# Patient Record
Sex: Female | Born: 1940
Health system: Southern US, Community
[De-identification: ages and names within clinical notes are randomized; demographics above are authoritative.]

## PROBLEM LIST (undated history)

## (undated) DIAGNOSIS — N943 Premenstrual tension syndrome: Secondary | ICD-10-CM

## (undated) DIAGNOSIS — G43909 Migraine, unspecified, not intractable, without status migrainosus: Secondary | ICD-10-CM

## (undated) DIAGNOSIS — L719 Rosacea, unspecified: Secondary | ICD-10-CM

## (undated) DIAGNOSIS — I10 Essential (primary) hypertension: Secondary | ICD-10-CM

## (undated) DIAGNOSIS — I639 Cerebral infarction, unspecified: Secondary | ICD-10-CM

## (undated) HISTORY — PX: OTHER SURGICAL HISTORY: SHX169

## (undated) HISTORY — DX: Premenstrual tension syndrome: N94.3

## (undated) HISTORY — DX: Migraine, unspecified, not intractable, without status migrainosus: G43.909

## (undated) HISTORY — DX: Essential (primary) hypertension: I10

## (undated) HISTORY — DX: Cerebral infarction, unspecified: I63.9

## (undated) HISTORY — DX: Rosacea, unspecified: L71.9

---

## 2003-06-07 ENCOUNTER — Encounter: Admission: RE | Admit: 2003-06-07 | Discharge: 2003-06-07 | Payer: Self-pay | Admitting: Family Medicine

## 2003-06-07 ENCOUNTER — Encounter: Payer: Self-pay | Admitting: Family Medicine

## 2003-06-21 HISTORY — PX: COLONOSCOPY: SHX174

## 2005-06-04 ENCOUNTER — Ambulatory Visit: Payer: Self-pay | Admitting: Family Medicine

## 2006-06-18 ENCOUNTER — Ambulatory Visit: Payer: Self-pay | Admitting: Family Medicine

## 2006-06-29 ENCOUNTER — Ambulatory Visit: Payer: Self-pay | Admitting: Internal Medicine

## 2006-06-30 ENCOUNTER — Ambulatory Visit: Payer: Self-pay

## 2006-08-04 ENCOUNTER — Encounter: Admission: RE | Admit: 2006-08-04 | Discharge: 2006-08-04 | Payer: Self-pay | Admitting: Family Medicine

## 2006-08-25 ENCOUNTER — Ambulatory Visit: Payer: Self-pay | Admitting: Family Medicine

## 2007-05-26 DIAGNOSIS — M810 Age-related osteoporosis without current pathological fracture: Secondary | ICD-10-CM

## 2007-05-26 DIAGNOSIS — M85859 Other specified disorders of bone density and structure, unspecified thigh: Secondary | ICD-10-CM | POA: Insufficient documentation

## 2007-05-26 DIAGNOSIS — J309 Allergic rhinitis, unspecified: Secondary | ICD-10-CM | POA: Insufficient documentation

## 2007-06-24 ENCOUNTER — Ambulatory Visit: Payer: Self-pay | Admitting: Family Medicine

## 2007-06-24 DIAGNOSIS — L719 Rosacea, unspecified: Secondary | ICD-10-CM | POA: Insufficient documentation

## 2007-07-21 ENCOUNTER — Encounter: Payer: Self-pay | Admitting: Family Medicine

## 2007-12-07 ENCOUNTER — Ambulatory Visit: Payer: Self-pay | Admitting: Family Medicine

## 2008-02-01 ENCOUNTER — Telehealth: Payer: Self-pay | Admitting: Family Medicine

## 2008-06-23 ENCOUNTER — Encounter: Payer: Self-pay | Admitting: Family Medicine

## 2008-06-23 ENCOUNTER — Ambulatory Visit: Payer: Self-pay | Admitting: Family Medicine

## 2008-06-23 ENCOUNTER — Other Ambulatory Visit: Admission: RE | Admit: 2008-06-23 | Discharge: 2008-06-23 | Payer: Self-pay | Admitting: Family Medicine

## 2008-06-28 LAB — CONVERTED CEMR LAB
Albumin: 4.2 g/dL (ref 3.5–5.2)
BUN: 21 mg/dL (ref 6–23)
Basophils Relative: 0.4 % (ref 0.0–3.0)
Calcium: 9.3 mg/dL (ref 8.4–10.5)
Creatinine, Ser: 0.6 mg/dL (ref 0.4–1.2)
Eosinophils Absolute: 0.1 10*3/uL (ref 0.0–0.7)
Eosinophils Relative: 2.3 % (ref 0.0–5.0)
GFR calc Af Amer: 128 mL/min
GFR calc non Af Amer: 106 mL/min
HCT: 39.4 % (ref 36.0–46.0)
HDL: 96.7 mg/dL (ref >39.0)
Hemoglobin: 13.9 g/dL (ref 12.0–15.0)
MCV: 86.5 fL (ref 78.0–100.0)
Monocytes Absolute: 0.3 10*3/uL (ref 0.1–1.0)
Neutro Abs: 2 10*3/uL (ref 1.4–7.7)
Platelets: 165 10*3/uL (ref 150–400)
Potassium: 4.4 meq/L (ref 3.5–5.1)
TSH: 1.79 microintl units/mL (ref 0.35–5.50)
Triglycerides: 45 mg/dL (ref 0–149)
WBC: 4.3 10*3/uL — ABNORMAL LOW (ref 4.5–10.5)

## 2008-07-21 ENCOUNTER — Ambulatory Visit: Payer: Self-pay | Admitting: Family Medicine

## 2008-07-21 DIAGNOSIS — I10 Essential (primary) hypertension: Secondary | ICD-10-CM | POA: Insufficient documentation

## 2008-07-27 ENCOUNTER — Encounter: Payer: Self-pay | Admitting: Family Medicine

## 2008-07-27 ENCOUNTER — Ambulatory Visit: Payer: Self-pay | Admitting: Internal Medicine

## 2008-08-08 ENCOUNTER — Encounter: Admission: RE | Admit: 2008-08-08 | Discharge: 2008-08-08 | Payer: Self-pay | Admitting: Family Medicine

## 2008-08-18 ENCOUNTER — Ambulatory Visit: Payer: Self-pay | Admitting: Family Medicine

## 2009-06-26 ENCOUNTER — Other Ambulatory Visit: Admission: RE | Admit: 2009-06-26 | Discharge: 2009-06-26 | Payer: Self-pay | Admitting: Family Medicine

## 2009-06-26 ENCOUNTER — Encounter: Payer: Self-pay | Admitting: Family Medicine

## 2009-06-26 ENCOUNTER — Ambulatory Visit: Payer: Self-pay | Admitting: Family Medicine

## 2009-06-28 ENCOUNTER — Telehealth: Payer: Self-pay | Admitting: Family Medicine

## 2009-07-03 ENCOUNTER — Telehealth: Payer: Self-pay | Admitting: Family Medicine

## 2009-07-31 ENCOUNTER — Ambulatory Visit: Payer: Self-pay | Admitting: Family Medicine

## 2009-08-09 ENCOUNTER — Encounter: Admission: RE | Admit: 2009-08-09 | Discharge: 2009-08-09 | Payer: Self-pay | Admitting: Family Medicine

## 2010-06-27 ENCOUNTER — Ambulatory Visit: Payer: Self-pay | Admitting: Family Medicine

## 2010-06-27 ENCOUNTER — Other Ambulatory Visit: Admission: RE | Admit: 2010-06-27 | Discharge: 2010-06-27 | Payer: Self-pay | Admitting: Family Medicine

## 2010-06-27 LAB — HM PAP SMEAR

## 2010-07-04 ENCOUNTER — Ambulatory Visit: Payer: Self-pay | Admitting: Family Medicine

## 2010-07-08 DIAGNOSIS — L739 Follicular disorder, unspecified: Secondary | ICD-10-CM | POA: Insufficient documentation

## 2010-08-14 ENCOUNTER — Encounter: Admission: RE | Admit: 2010-08-14 | Discharge: 2010-08-14 | Payer: Self-pay | Admitting: Family Medicine

## 2010-08-14 LAB — HM MAMMOGRAPHY

## 2010-11-17 LAB — CONVERTED CEMR LAB
ALT: 18 units/L (ref 0–35)
AST: 25 units/L (ref 0–37)
Albumin: 4.1 g/dL (ref 3.5–5.2)
Alkaline Phosphatase: 63 units/L (ref 39–117)
BUN: 21 mg/dL (ref 6–23)
Basophils Absolute: 0 10*3/uL (ref 0.0–0.1)
Basophils Relative: 0.2 % (ref 0.0–3.0)
Bilirubin Urine: NEGATIVE
Bilirubin Urine: NEGATIVE
Bilirubin, Direct: 0.2 mg/dL (ref 0.0–0.3)
Blood in Urine, dipstick: NEGATIVE
CO2: 28 meq/L (ref 19–32)
CO2: 30 meq/L (ref 19–32)
Calcium: 9.3 mg/dL (ref 8.4–10.5)
Chloride: 105 meq/L (ref 96–112)
Chloride: 106 meq/L (ref 96–112)
Cholesterol: 207 mg/dL — ABNORMAL HIGH (ref 0–200)
Cholesterol: 211 mg/dL — ABNORMAL HIGH (ref 0–200)
Creatinine, Ser: 0.6 mg/dL (ref 0.4–1.2)
Creatinine, Ser: 0.6 mg/dL (ref 0.4–1.2)
Direct LDL: 100.1 mg/dL
Direct LDL: 99.6 mg/dL
Eosinophils Absolute: 0.1 10*3/uL (ref 0.0–0.6)
Eosinophils Absolute: 0.1 10*3/uL (ref 0.0–0.7)
Eosinophils Absolute: 0.1 10*3/uL (ref 0.0–0.7)
Eosinophils Relative: 2.6 % (ref 0.0–5.0)
Eosinophils Relative: 2.7 % (ref 0.0–5.0)
GFR calc Af Amer: 128 mL/min
GFR calc non Af Amer: 106 mL/min
Glucose, Bld: 84 mg/dL (ref 70–99)
Glucose, Bld: 91 mg/dL (ref 70–99)
Glucose, Bld: 94 mg/dL (ref 70–99)
Glucose, Urine, Semiquant: NEGATIVE
Glucose, Urine, Semiquant: NEGATIVE
Glucose, Urine, Semiquant: NEGATIVE
HCT: 39.3 % (ref 36.0–46.0)
HCT: 41.2 % (ref 36.0–46.0)
Hemoglobin: 13.8 g/dL (ref 12.0–15.0)
Ketones, urine, test strip: NEGATIVE
Lymphocytes Relative: 41.8 % (ref 12.0–46.0)
Lymphocytes Relative: 43.9 % (ref 12.0–46.0)
Lymphs Abs: 1.7 10*3/uL (ref 0.7–4.0)
MCHC: 33.5 g/dL (ref 30.0–36.0)
MCHC: 33.9 g/dL (ref 30.0–36.0)
MCV: 85.4 fL (ref 78.0–100.0)
MCV: 87.1 fL (ref 78.0–100.0)
MCV: 87.2 fL (ref 78.0–100.0)
Monocytes Absolute: 0.3 10*3/uL (ref 0.1–1.0)
Monocytes Absolute: 0.3 10*3/uL (ref 0.1–1.0)
Neutro Abs: 1.8 10*3/uL (ref 1.4–7.7)
Neutro Abs: 2.1 10*3/uL (ref 1.4–7.7)
Neutrophils Relative %: 42.7 % — ABNORMAL LOW (ref 43.0–77.0)
Neutrophils Relative %: 49.3 % (ref 43.0–77.0)
Neutrophils Relative %: 51.4 % (ref 43.0–77.0)
Pap Smear: NEGATIVE
Platelets: 172 10*3/uL (ref 150.0–400.0)
Potassium: 4.5 meq/L (ref 3.5–5.1)
Protein, U semiquant: NEGATIVE
RDW: 13.2 % (ref 11.5–14.6)
Sodium: 141 meq/L (ref 135–145)
Specific Gravity, Urine: 1.02
Specific Gravity, Urine: 1.02
Specific Gravity, Urine: 1.02
TSH: 1.81 microintl units/mL (ref 0.35–5.50)
TSH: 2.2 microintl units/mL (ref 0.35–5.50)
Total Bilirubin: 0.9 mg/dL (ref 0.3–1.2)
Total Protein: 7.3 g/dL (ref 6.0–8.3)
Total Protein: 7.3 g/dL (ref 6.0–8.3)
Triglycerides: 61 mg/dL (ref 0.0–149.0)
VLDL: 10 mg/dL (ref 0.0–40.0)
WBC Urine, dipstick: NEGATIVE
WBC Urine, dipstick: NEGATIVE
WBC: 4.3 10*3/uL — ABNORMAL LOW (ref 4.5–10.5)
WBC: 4.4 10*3/uL — ABNORMAL LOW (ref 4.5–10.5)
pH: 7
pH: 8.5

## 2010-11-19 NOTE — Letter (Signed)
Summary: Pharmacologist at [facility]  [address] [place], [place] [address]   Phone: [phone]  Fax: [phone]    I hereby consent to, and authorize:   to perform the following procedure:   on patient: Tricia Newman   utilizing the following anesthetic or anesthesia: local  administered by the following individual:   My physician has explained the following to me in language that I understand: The nature of the treatment/procedure, the risks of the procedure, the possible complications of the procedure, the expected benefits or effects of the treatment/procedure, and any alternatives to the procedure and their risks and benefits.  I understand that no guarantees have been made to me concerning the results of treatment, surgery, or other procedures.  If unforeseen conditions require additional procedures, and it is not reasonably practical to obtain my consent, I authorize my physician to proceed as he/she considers advisable and in my best interest, unless otherwise specified as follows.  Exceptions, if any:   I have read the previous information, and I understand it.  Any questions which may have occurred to me have been answered to my satisfaction.  ______________________________________________ Patient Signature/Date/Time  ______________________________________________ Parent, Guardian, or Legal Representative/Date (State your relationship to the patient)  ______________________________________________ Witness Signature/Date/Time

## 2010-11-19 NOTE — Assessment & Plan Note (Signed)
Summary: EMP/PT FASTING/CJR   Vital Signs:  Patient profile:   70 year old female Menstrual status:  postmenopausal Height:      61.75 inches Weight:      148 pounds BMI:     27.39 Temp:     97.9 degrees F oral BP sitting:   140 / 82  (left arm) Cuff size:   regular  Vitals Entered By: Kathrynn Speed CMA (June 27, 2010 9:15 AM) CC: emp, fasting, src Is Patient Diabetic? No Flu Vaccine Consent Questions     Do you have a history of severe allergic reactions to this vaccine? no    Any prior history of allergic reactions to egg and/or gelatin? no    Do you have a sensitivity to the preservative Thimersol? no    Do you have a past history of Guillan-Barre Syndrome? no    Do you currently have an acute febrile illness? no    Have you ever had a severe reaction to latex? no    Vaccine information given and explained to patient? yes    Are you currently pregnant? no    Lot Number:[id number]   Exp Date:04/19/2011   Site Given  Left Deltoid IM   CC:  emp, fasting, and src.  History of Present Illness: Tricia Newman is a 70 year old, single female, nonsmoker, who comes in today for a general physical examination because of a history of underlying mild hypertension and acne rosacea.  Her hypertension is treated with Zestril 10 mg daily BP 140/82, here at home 120/80.  She also takes doxycycline 100 mg b.i.d. for acne rosacea.  She takes calcium, aspirin, vitamin D, and Claritin, 10 mg daily for allergic rhinitis.  She walks daily.  She gets routine eye care, dental care, does BSE monthly, annual mammography, tetanus, 2007, seasonal flu shot 2010 and today, Pneumovax 2006, colonoscopy due to thousand 14, bone density done 2000 and shows some ostiopenia  Here for Medicare AWV:  1.   Risk factors based on Past M, S, F history:...reviewed.  No changes 2.   Physical Activities: walks daily 3.   Depression/mood: good mood.  No depression 4.   Hearing: normal 5.   ADL's: normal 6.   Fall  Risk: reviewed none 7.   Home Safety: no guns in the house 8.   Height, weight, &visual acuity:height weight, normal.  Eye exam normal by ophthalmologist 9.   Counseling: continue current exercise program and medications 10.   Labs ordered based on risk factors: done today 11.           Referral Coordination..........none indicated 13.            Cognitive Assessment .......normal mentation, able to manage her own finances  Preventive Screening-Counseling & Management  Alcohol-Tobacco     Smoking Status: quit  Current Medications (verified): 1)  Doxycycline Hyclate 100 Mg Caps (Doxycycline Hyclate) .... Take 1 Tablet By Mouth Two Times A Day 2)  Ca 1500 Vit D800 .... Once Daily 3)  Claritin 10 Mg  Tabs (Loratadine) .Marland Kitchen.. 1qd 4)  Centrum Silver  Tabs (Multiple Vitamins-Minerals) .... Once Daily 5)  Fish Oil 300 Mg Caps (Omega-3 Fatty Acids) .... Once Daily 6)  Flexeril 10 Mg Tabs (Cyclobenzaprine Hcl) .... Take 1 Tablet By Mouth Three Times A Day 7)  Vicodin Es 7.5-750 Mg Tabs (Hydrocodone-Acetaminophen) .... Take 1 Tablet By Mouth Three Times A Day 8)  Zestril 10 Mg Tabs (Lisinopril) .... Take 1 Tablet By Mouth Every Morning 9)  Aspir-Low 81 Mg Tbec (Aspirin) .... Take One Tab Once Daily  Allergies (verified): No Known Drug Allergies  Past History:  Past medical, surgical, family and social histories (including risk factors) reviewed, and no changes noted (except as noted below).  Past Medical History: Reviewed history from 07/21/2008 and no changes required. Allergic rhinitis Osteoporosis Migraine Headache PMS Rosacea Hypertension  Past Surgical History: Reviewed history from 05/26/2007 and no changes required. Childbirth x 2 Colonoscopy-06/2003  Family History: Reviewed history and no changes required.  Social History: Reviewed history from 06/24/2007 and no changes required. Former Smoker Alcohol use-no Regular exercise-yes  Review of Systems      See  HPI  Physical Exam  General:  Well-developed,well-nourished,in no acute distress; alert,appropriate and cooperative throughout examination Head:  Normocephalic and atraumatic without obvious abnormalities. No apparent alopecia or balding. Eyes:  No corneal or conjunctival inflammation noted. EOMI. Perrla. Funduscopic exam benign, without hemorrhages, exudates or papilledema. Vision grossly normal. Ears:  External ear exam shows no significant lesions or deformities.  Otoscopic examination reveals clear canals, tympanic membranes are intact bilaterally without bulging, retraction, inflammation or discharge. Hearing is grossly normal bilaterally. Nose:  External nasal examination shows no deformity or inflammation. Nasal mucosa are pink and moist without lesions or exudates. Mouth:  Oral mucosa and oropharynx without lesions or exudates.  Teeth in good repair. Neck:  No deformities, masses, or tenderness noted. Chest Wall:  No deformities, masses, or tenderness noted. Breasts:  No mass, nodules, thickening, tenderness, bulging, retraction, inflamation, nipple discharge or skin changes noted.   Lungs:  Normal respiratory effort, chest expands symmetrically. Lungs are clear to auscultation, no crackles or wheezes. Heart:  Normal rate and regular rhythm. S1 and S2 normal without gallop, murmur, click, rub or other extra sounds. Abdomen:  Bowel sounds positive,abdomen soft and non-tender without masses, organomegaly or hernias noted. Rectal:  No external abnormalities noted. Normal sphincter tone. No rectal masses or tenderness. Genitalia:  Pelvic Exam:        External: normal female genitalia without lesions or masses        Vagina: normal without lesions or masses        Cervix: normal without lesions or masses        Adnexa: normal bimanual exam without masses or fullness        Uterus: normal by palpation        Pap smear: performed Msk:  No deformity or scoliosis noted of thoracic or lumbar  spine.   Pulses:  R and L carotid,radial,femoral,dorsalis pedis and posterior tibial pulses are full and equal bilaterally Extremities:  No clubbing, cyanosis, edema, or deformity noted with normal full range of motion of all joints.   Neurologic:  No cranial nerve deficits noted. Station and gait are normal. Plantar reflexes are down-going bilaterally. DTRs are symmetrical throughout. Sensory, motor and coordinative functions appear intact. Skin:  Intact without suspicious lesions or rashes......................Marland Kitchenexcept for a pitted lesion, mid forehead.  Advised to return next week for removal Cervical Nodes:  No lymphadenopathy noted Axillary Nodes:  No palpable lymphadenopathy Inguinal Nodes:  No significant adenopathy Psych:  Cognition and judgment appear intact. Alert and cooperative with normal attention span and concentration. No apparent delusions, illusions, hallucinations   Impression & Recommendations:  Problem # 1:  HYPERTENSION (ICD-401.9) Assessment Improved  Her updated medication list for this problem includes:    Zestril 10 Mg Tabs (Lisinopril) .Marland Kitchen... Take 1 tablet by mouth every morning  Orders: Venipuncture (21308) TLB-Lipid Panel (80061-LIPID) TLB-BMP (  Basic Metabolic Panel-BMET) (80048-METABOL) TLB-CBC Platelet - w/Differential (85025-CBCD) TLB-Hepatic/Liver Function Pnl (80076-HEPATIC) TLB-TSH (Thyroid Stimulating Hormone) (13086-VHQ) Prescription Created Electronically [id number]) Medicare -1st Annual Wellness Visit [phone]) Urinalysis-dipstick only (Medicare patient) (13244WN) EKG w/ Interpretation (93000) Specimen Handling (02725)  Problem # 2:  ACNE ROSACEA (ICD-695.3) Assessment: Improved  Orders: Venipuncture (36644) TLB-Lipid Panel (80061-LIPID) TLB-BMP (Basic Metabolic Panel-BMET) (80048-METABOL) TLB-CBC Platelet - w/Differential (85025-CBCD) TLB-Hepatic/Liver Function Pnl (80076-HEPATIC) TLB-TSH (Thyroid Stimulating Hormone) (03474-QVZ) Prescription  Created Electronically [phone]) Medicare -1st Annual Wellness Visit [phone]) Urinalysis-dipstick only (Medicare patient) (29518AC) Specimen Handling (16606)  Problem # 3:  HEALTH SCREENING (ICD-V70.0) Assessment: Unchanged  Orders: Venipuncture (30160) TLB-Lipid Panel (80061-LIPID) TLB-BMP (Basic Metabolic Panel-BMET) (80048-METABOL) TLB-CBC Platelet - w/Differential (85025-CBCD) TLB-Hepatic/Liver Function Pnl (80076-HEPATIC) TLB-TSH (Thyroid Stimulating Hormone) (10932-TFT) Prescription Created Electronically [phone]) Medicare -1st Annual Wellness Visit [phone]) Urinalysis-dipstick only (Medicare patient) (06237SE) EKG w/ Interpretation (93000) Specimen Handling (83151)  Problem # 4:  ALLERGIC RHINITIS (ICD-477.9) Assessment: Unchanged  Her updated medication list for this problem includes:    Claritin 10 Mg Tabs (Loratadine) .Marland Kitchen... 1qd  Orders: Venipuncture (76160) TLB-Lipid Panel (80061-LIPID) TLB-BMP (Basic Metabolic Panel-BMET) (80048-METABOL) TLB-CBC Platelet - w/Differential (85025-CBCD) TLB-Hepatic/Liver Function Pnl (80076-HEPATIC) TLB-TSH (Thyroid Stimulating Hormone) (73710-GYI) Prescription Created Electronically [phone]) Medicare -1st Annual Wellness Visit [phone]) Urinalysis-dipstick only (Medicare patient) (50093GH) Specimen Handling (82993)  Complete Medication List: 1)  Doxycycline Hyclate 100 Mg Caps (Doxycycline hyclate) .... Take 1 tablet by mouth two times a day 2)  Ca 1500 Vit D800  .... Once daily 3)  Claritin 10 Mg Tabs (Loratadine) .Marland Kitchen.. 1qd 4)  Centrum Silver Tabs (Multiple vitamins-minerals) .... Once daily 5)  Fish Oil 300 Mg Caps (Omega-3 fatty acids) .... Once daily 6)  Flexeril 10 Mg Tabs (Cyclobenzaprine hcl) .... Take 1 tablet by mouth three times a day 7)  Vicodin Es 7.5-750 Mg Tabs (Hydrocodone-acetaminophen) .... Take 1 tablet by mouth three times a day 8)  Zestril 10 Mg Tabs (Lisinopril) .... Take 1 tablet by mouth every morning 9)   Aspir-low 81 Mg Tbec (Aspirin) .... Take one tab once daily  Other Orders: Admin 1st Vaccine (71696) Flu Vaccine 63yrs + (78938)  Patient Instructions: 1)  Please schedule a follow-up appointment in 1 year. 2)  It is important that you exercise regularly at least 20 minutes 5 times a week. If you develop chest pain, have severe difficulty breathing, or feel very tired , stop exercising immediately and seek medical attention. 3)  Schedule your mammogram. 4)  Schedule a colonoscopy/sigmoidoscopy to help detect colon cancer. 5)  Take calcium +Vitamin D daily. 6)  Take an Aspirin every day. 7)  Choose your Health care Power of Attorney and/or prepare a Living Will. 8)  set up an appointment next week to get the mole removed from your forehead Prescriptions: ZESTRIL 10 MG TABS (LISINOPRIL) Take 1 tablet by mouth every morning  #100.0 Each x 3   Entered and Authorized by:   Roderick Pee MD   Signed by:   Roderick Pee MD on 06/27/2010   Method used:   Electronically to        [facility]* (retail)       [address] [place], [place]  [address]       Ph: [phone]       Fax: [phone]   RxID:   [id number] DOXYCYCLINE HYCLATE 100 MG CAPS (DOXYCYCLINE HYCLATE) Take 1 tablet by mouth two times a day  #200  x 4   Entered and Authorized by:   Roderick Pee MD   Signed by:   Roderick Pee MD on 06/27/2010   Method used:   Electronically to        [facility]* (retail)       [address] [place], [place]  [address]       Ph: [phone]       Fax: [phone]   RxID:   [id number]     Laboratory Results   Urine Tests    Routine Urinalysis   Color: yellow Appearance: Clear Glucose: negative   (Normal Range: Negative) Bilirubin: negative   (Normal Range: Negative) Ketone: trace (5)   (Normal Range: Negative) Spec. Gravity: 1.020   (Normal Range: 1.003-1.035) Blood: negative   (Normal Range: Negative) pH: 8.5   (Normal Range:  5.0-8.0) Protein: negative   (Normal Range: Negative) Urobilinogen: 0.2   (Normal Range: 0-1) Nitrite: negative   (Normal Range: Negative) Leukocyte Esterace: negative   (Normal Range: Negative)

## 2010-11-19 NOTE — Assessment & Plan Note (Signed)
Summary: mole removal//alp   Procedure Note Last Tetanus: Td (06/18/2006)  Mole Biopsy/Removal: Indication: rule out cancer Consent signed: yes  Procedure # 1: elliptical incision with 2 mm margin    Size (in cm): 0.5 x 0.5    Region: anterior    Location: forehead    Instrument used: #15 blade    Anesthesia: 1% lidocaine w/epinephrine    Closure: cautery  Cleaned and prepped with: alcohol Wound dressing: bandaid   History of Present Illness: Tricia Newman is a 70 year old female, who comes in today for removal of a lesion on her for head.  We did her physical examination last week.  She is a pitted lesion on her for it.  She returns today for removal.  Allergies: No Known Drug Allergies   Complete Medication List: 1)  Doxycycline Hyclate 100 Mg Caps (Doxycycline hyclate) .... Take 1 tablet by mouth two times a day 2)  Ca 1500 Vit D800  .... Once daily 3)  Claritin 10 Mg Tabs (Loratadine) .Marland Kitchen.. 1qd 4)  Centrum Silver Tabs (Multiple vitamins-minerals) .... Once daily 5)  Fish Oil 300 Mg Caps (Omega-3 fatty acids) .... Once daily 6)  Flexeril 10 Mg Tabs (Cyclobenzaprine hcl) .... Take 1 tablet by mouth three times a day 7)  Vicodin Es 7.5-750 Mg Tabs (Hydrocodone-acetaminophen) .... Take 1 tablet by mouth three times a day 8)  Zestril 10 Mg Tabs (Lisinopril) .... Take 1 tablet by mouth every morning 9)  Aspir-low 81 Mg Tbec (Aspirin) .... Take one tab once daily  Other Orders: Shave Skin Lesion < 0.5cm face/ears/eyelids/nose/lips/mm (11310) Prescriptions: VICODIN ES 7.5-750 MG TABS (HYDROCODONE-ACETAMINOPHEN) Take 1 tablet by mouth three times a day  #30 x 2   Entered and Authorized by:   Roderick Pee MD   Signed by:   Roderick Pee MD on 07/04/2010   Method used:   Print then Give to Patient   RxID:   [id number] FLEXERIL 10 MG TABS (CYCLOBENZAPRINE HCL) Take 1 tablet by mouth three times a day  #30 x 2   Entered and Authorized by:   Roderick Pee MD   Signed by:    Roderick Pee MD on 07/04/2010   Method used:   Print then Give to Patient   RxID:   [id number]

## 2010-11-19 NOTE — Miscellaneous (Signed)
Summary: Consent for Mole Removal  Consent for Mole Removal   Imported By: Maryln Gottron 07/09/2010 10:10:27  _____________________________________________________________________  External Attachment:    Type:   Image     Comment:   External Document

## 2011-03-16 ENCOUNTER — Emergency Department (INDEPENDENT_AMBULATORY_CARE_PROVIDER_SITE_OTHER): Payer: Medicare Other

## 2011-03-16 ENCOUNTER — Emergency Department (HOSPITAL_BASED_OUTPATIENT_CLINIC_OR_DEPARTMENT_OTHER)
Admission: EM | Admit: 2011-03-16 | Discharge: 2011-03-16 | Disposition: A | Discharge status: Home / Self Care | Payer: Medicare Other | Attending: Emergency Medicine | Admitting: Emergency Medicine

## 2011-03-16 DIAGNOSIS — N200 Calculus of kidney: Secondary | ICD-10-CM | POA: Insufficient documentation

## 2011-03-16 DIAGNOSIS — D259 Leiomyoma of uterus, unspecified: Secondary | ICD-10-CM | POA: Insufficient documentation

## 2011-03-16 DIAGNOSIS — I10 Essential (primary) hypertension: Secondary | ICD-10-CM | POA: Insufficient documentation

## 2011-03-16 DIAGNOSIS — R1031 Right lower quadrant pain: Secondary | ICD-10-CM | POA: Insufficient documentation

## 2011-03-16 LAB — BASIC METABOLIC PANEL
CO2: 23 mEq/L (ref 19–32)
Calcium: 9.8 mg/dL (ref 8.4–10.5)
Creatinine, Ser: <0.47 mg/dL (ref 0.4–1.2)

## 2011-03-16 LAB — CBC
MCH: 28.6 pg (ref 26.0–34.0)
MCHC: 35 g/dL (ref 30.0–36.0)
Platelets: 201 10*3/uL (ref 150–400)
RDW: 13.7 % (ref 11.5–15.5)

## 2011-03-16 LAB — DIFFERENTIAL
Basophils Relative: 0 % (ref 0–1)
Eosinophils Absolute: 0.1 10*3/uL (ref 0.0–0.7)
Monocytes Absolute: 0.4 10*3/uL (ref 0.1–1.0)
Monocytes Relative: 8 % (ref 3–12)

## 2011-03-16 LAB — URINALYSIS, ROUTINE W REFLEX MICROSCOPIC
Bilirubin Urine: NEGATIVE
Hgb urine dipstick: NEGATIVE
Nitrite: NEGATIVE
Protein, ur: NEGATIVE mg/dL
Urobilinogen, UA: 0.2 mg/dL (ref 0.0–1.0)

## 2011-03-16 MED ORDER — IOHEXOL 300 MG/ML  SOLN
100.0000 mL | Freq: Once | INTRAMUSCULAR | Status: AC | PRN
  Administered 2011-03-16: 100 mL via INTRAVENOUS

## 2011-07-14 ENCOUNTER — Ambulatory Visit: Payer: Self-pay | Admitting: Family Medicine

## 2011-07-21 ENCOUNTER — Other Ambulatory Visit: Payer: Self-pay | Admitting: Family Medicine

## 2011-07-21 DIAGNOSIS — Z1231 Encounter for screening mammogram for malignant neoplasm of breast: Secondary | ICD-10-CM

## 2011-07-30 ENCOUNTER — Ambulatory Visit (INDEPENDENT_AMBULATORY_CARE_PROVIDER_SITE_OTHER): Payer: Medicare Other | Admitting: Family Medicine

## 2011-07-30 ENCOUNTER — Encounter: Payer: Self-pay | Admitting: Family Medicine

## 2011-07-30 DIAGNOSIS — I10 Essential (primary) hypertension: Secondary | ICD-10-CM

## 2011-07-30 DIAGNOSIS — Z0000 Encounter for general adult medical examination without abnormal findings: Secondary | ICD-10-CM

## 2011-07-30 DIAGNOSIS — Z23 Encounter for immunization: Secondary | ICD-10-CM

## 2011-07-30 DIAGNOSIS — L719 Rosacea, unspecified: Secondary | ICD-10-CM

## 2011-07-30 LAB — LIPID PANEL
Cholesterol: 214 mg/dL — ABNORMAL HIGH (ref 0–200)
Total CHOL/HDL Ratio: 2
Triglycerides: 80 mg/dL (ref 0.0–149.0)
VLDL: 16 mg/dL (ref 0.0–40.0)

## 2011-07-30 LAB — CBC WITH DIFFERENTIAL/PLATELET
Basophils Absolute: 0 10*3/uL (ref 0.0–0.1)
Eosinophils Absolute: 0.1 10*3/uL (ref 0.0–0.7)
Lymphocytes Relative: 37.5 % (ref 12.0–46.0)
MCHC: 33.3 g/dL (ref 30.0–36.0)
Neutrophils Relative %: 53.3 % (ref 43.0–77.0)
RDW: 14.7 % — ABNORMAL HIGH (ref 11.5–14.6)

## 2011-07-30 LAB — POCT URINALYSIS DIPSTICK
Bilirubin, UA: NEGATIVE
Blood, UA: NEGATIVE
Glucose, UA: NEGATIVE
Ketones, UA: NEGATIVE
Nitrite, UA: NEGATIVE
Spec Grav, UA: 1.02

## 2011-07-30 LAB — BASIC METABOLIC PANEL
CO2: 28 mEq/L (ref 19–32)
Calcium: 9.2 mg/dL (ref 8.4–10.5)
Creatinine, Ser: 0.6 mg/dL (ref 0.4–1.2)

## 2011-07-30 LAB — HEPATIC FUNCTION PANEL
Alkaline Phosphatase: 77 U/L (ref 39–117)
Bilirubin, Direct: 0 mg/dL (ref 0.0–0.3)
Total Bilirubin: 0.7 mg/dL (ref 0.3–1.2)

## 2011-07-30 MED ORDER — LISINOPRIL 10 MG PO TABS: 10.0000 mg | ORAL_TABLET | Freq: Every day | ORAL | Status: DC

## 2011-07-30 MED ORDER — DOXYCYCLINE HYCLATE 100 MG PO CAPS: 100.0000 mg | ORAL_CAPSULE | Freq: Two times a day (BID) | ORAL | Status: DC

## 2011-07-30 NOTE — Progress Notes (Signed)
  Subjective:    Patient ID: Tricia Newman, female    DOB: 15-May-1941, 70 y.o.   MRN: [id number]  HPI Tricia Newman is a 70 year old, widowed female, nonsmoker, who comes in today for Medicare wellness examination because of a history of rosacea, allergic rhinitis, hypertension.  She takes lisinopril, 10 mg daily for hypertension, BP 120/78.  She takes Claritin OTC for allergic rhinitis.  She takes doxycycline 100 mg b.i.d. For rosacea.  She gets routine eye care, hearing normal, regular dental care, BSE monthly, annual mammography, colonoscopy, 2005, normal, tetanus, 2007, Pneumovax 2006, shingles 2009.  Activities daily living.  Normal.  Cognitive function, normal.  She functions independently.  Home health safety reviewed.  No issues identified.  No guns in the house.  She does have a healthcare power of attorney and living will.  She walks on a daily basis and tends a big garden in the summer.  Sheb rings a wonderful vegetables   Review of Systems  Constitutional: Negative.   HENT: Negative.   Eyes: Negative.   Respiratory: Negative.   Cardiovascular: Negative.   Gastrointestinal: Negative.   Genitourinary: Negative.   Musculoskeletal: Negative.   Neurological: Negative.   Hematological: Negative.   Psychiatric/Behavioral: Negative.   b     Objective:   Physical Exam  Constitutional: She appears well-developed and well-nourished.  HENT:  Head: Normocephalic and atraumatic.  Right Ear: External ear normal.  Left Ear: External ear normal.  Nose: Nose normal.  Mouth/Throat: Oropharynx is clear and moist.  Eyes: EOM are normal. Pupils are equal, round, and reactive to light.  Neck: Normal range of motion. Neck supple. No thyromegaly present.  Cardiovascular: Normal rate, regular rhythm, normal heart sounds and intact distal pulses.  Exam reveals no gallop and no friction rub.   No murmur heard. Pulmonary/Chest: Effort normal and breath sounds normal.  Abdominal: Soft. Bowel  sounds are normal. She exhibits no distension and no mass. There is no tenderness. There is no rebound.  Genitourinary: Vagina normal and uterus normal. Guaiac negative stool. No vaginal discharge found.  Musculoskeletal: Normal range of motion.  Lymphadenopathy:    She has no cervical adenopathy.  Neurological: She is alert. She has normal reflexes. No cranial nerve deficit. She exhibits normal muscle tone. Coordination normal.  Skin: Skin is warm and dry.  Psychiatric: She has a normal mood and affect. Her behavior is normal. Judgment and thought content normal.   Bilateral breast exam normal       Assessment & Plan:  Healthy female.  Hypertension.  Continues, lisinopril 10 mg daily.  Rosacea continue doxycycline 100 mg b.i.d.  Allergic rhinitis.  Continue OTC Claritin.  Return one year or sooner if any problems

## 2011-07-30 NOTE — Patient Instructions (Signed)
Continue your current medications.  Follow-up in one year or sooner if any problems

## 2011-08-18 ENCOUNTER — Ambulatory Visit
Admission: RE | Admit: 2011-08-18 | Discharge: 2011-08-18 | Disposition: A | Discharge status: Home / Self Care | Payer: Medicare Other | Source: Ambulatory Visit | Attending: Family Medicine | Admitting: Family Medicine

## 2011-08-18 DIAGNOSIS — Z1231 Encounter for screening mammogram for malignant neoplasm of breast: Secondary | ICD-10-CM

## 2011-09-19 ENCOUNTER — Other Ambulatory Visit: Payer: Self-pay | Admitting: Family Medicine

## 2011-10-31 DIAGNOSIS — H40019 Open angle with borderline findings, low risk, unspecified eye: Secondary | ICD-10-CM | POA: Diagnosis not present

## 2012-06-22 ENCOUNTER — Other Ambulatory Visit: Payer: Self-pay | Admitting: Family Medicine

## 2012-06-22 ENCOUNTER — Telehealth: Payer: Self-pay | Admitting: Family Medicine

## 2012-06-22 DIAGNOSIS — Z1231 Encounter for screening mammogram for malignant neoplasm of breast: Secondary | ICD-10-CM

## 2012-06-22 NOTE — Telephone Encounter (Signed)
[facility] Triage Call Report Triage Record Num: [id number] Operator: Chevis Pretty Patient Name: [name] Call Date & Time: 06/21/2012 11:18:07AM Patient Phone: [phone] PCP: Eugenio Hoes. Todd Patient Gender: Female PCP Fax : [phone] Patient DOB: [birth year] Practice Name: [facility] Reason for Call: Caller: [place]/Patient; PCP: Kelle Darting [facility]); CB#: [phone]; Call regarding Back Pain; having muscle spasms, and has run out of medication. States her hydrocodone medication expiration date was 02/13. States onset new shoulder pain after working in the garden 06/20/12. States pain makes right shoulder have spasms which are excruciating, about 30-60sec apart. Advil is ineffective. Per shoulder pain protocol, emergent symptoms denied; advised appointment within 24 hours. To call office in AM for appointment, as Dr. Tawanna Cooler is not in office 06/22/12. Per standing orders, Rx for flexeril 5mg , 1 po TID, #6, NR and ultram 50mg , 1-2 q 4-6h prn pain, #6, NR called in to [facility]/ [phone]. Protocol(s) Used: Shoulder Non-Injury Recommended Outcome per Protocol: See Provider within 24 hours Reason for Outcome: Severe pain with movement that limits normal activities

## 2012-06-22 NOTE — Telephone Encounter (Signed)
Pt call back this morning medications working great

## 2012-08-02 ENCOUNTER — Encounter: Payer: Self-pay | Admitting: Family Medicine

## 2012-08-02 ENCOUNTER — Ambulatory Visit (INDEPENDENT_AMBULATORY_CARE_PROVIDER_SITE_OTHER): Payer: Medicare Other | Admitting: Family Medicine

## 2012-08-02 VITALS — BP 130/80 | Temp 97.7°F | Ht 63.0 in | Wt 150.0 lb

## 2012-08-02 DIAGNOSIS — M549 Dorsalgia, unspecified: Secondary | ICD-10-CM

## 2012-08-02 DIAGNOSIS — G8929 Other chronic pain: Secondary | ICD-10-CM

## 2012-08-02 DIAGNOSIS — J309 Allergic rhinitis, unspecified: Secondary | ICD-10-CM | POA: Diagnosis not present

## 2012-08-02 DIAGNOSIS — M810 Age-related osteoporosis without current pathological fracture: Secondary | ICD-10-CM | POA: Diagnosis not present

## 2012-08-02 DIAGNOSIS — Z0000 Encounter for general adult medical examination without abnormal findings: Secondary | ICD-10-CM | POA: Diagnosis not present

## 2012-08-02 DIAGNOSIS — Z23 Encounter for immunization: Secondary | ICD-10-CM | POA: Diagnosis not present

## 2012-08-02 DIAGNOSIS — I10 Essential (primary) hypertension: Secondary | ICD-10-CM

## 2012-08-02 LAB — CBC WITH DIFFERENTIAL/PLATELET
Basophils Absolute: 0 10*3/uL (ref 0.0–0.1)
Basophils Relative: 0.6 % (ref 0.0–3.0)
Eosinophils Absolute: 0.1 10*3/uL (ref 0.0–0.7)
Hemoglobin: 13.7 g/dL (ref 12.0–15.0)
MCHC: 32.4 g/dL (ref 30.0–36.0)
MCV: 88 fl (ref 78.0–100.0)
Monocytes Absolute: 0.3 10*3/uL (ref 0.1–1.0)
Neutro Abs: 2.3 10*3/uL (ref 1.4–7.7)
RBC: 4.8 Mil/uL (ref 3.87–5.11)
RDW: 14.3 % (ref 11.5–14.6)

## 2012-08-02 LAB — BASIC METABOLIC PANEL
BUN: 22 mg/dL (ref 6–23)
CO2: 27 mEq/L (ref 19–32)
Calcium: 9.2 mg/dL (ref 8.4–10.5)
Creatinine, Ser: 0.5 mg/dL (ref 0.4–1.2)
Glucose, Bld: 89 mg/dL (ref 70–99)

## 2012-08-02 LAB — POCT URINALYSIS DIPSTICK
Blood, UA: NEGATIVE
Nitrite, UA: NEGATIVE
Protein, UA: NEGATIVE
Spec Grav, UA: 1.015
Urobilinogen, UA: 0.2
pH, UA: 5.5

## 2012-08-02 MED ORDER — TRAMADOL HCL 50 MG PO TABS: ORAL_TABLET | ORAL | Status: DC

## 2012-08-02 MED ORDER — LISINOPRIL 10 MG PO TABS: ORAL_TABLET | ORAL | Status: DC

## 2012-08-02 MED ORDER — CYCLOBENZAPRINE HCL 10 MG PO TABS: ORAL_TABLET | ORAL | Status: DC

## 2012-08-02 NOTE — Progress Notes (Signed)
  Subjective:    Patient ID: Tricia Newman, female    DOB: 05/18/1941, 71 y.o.   MRN: [id number]  HPI Tricia Newman is a 71 year old single female nonsmoker who comes in today for a Medicare wellness examination because a history of hypertension mild osteoporosis and recurrent low back pain  Her medications reviewed there've been no changes. She takes Flexeril and tramadol when necessary when she has a flare of her back pain. Her blood pressure is 130/80 on lisinopril 10 mg daily. She takes over-the-counter Claritin for allergic rhinitis  She takes calcium vitamin D and she walks on a daily basis to maintain bone strength. She gets routine eye care, dental care, BSE monthly, and you mammography, colonoscopy and GI, tetanus 2007, Pneumovax 2006, shingles 2009, seasonal flu shot today.   Review of Systems  Constitutional: Negative.   HENT: Negative.   Eyes: Negative.   Respiratory: Negative.   Cardiovascular: Negative.   Gastrointestinal: Negative.   Genitourinary: Negative.   Musculoskeletal: Negative.   Neurological: Negative.   Hematological: Negative.   Psychiatric/Behavioral: Negative.        Objective:   Physical Exam  Constitutional: She appears well-developed and well-nourished.  HENT:  Head: Normocephalic and atraumatic.  Right Ear: External ear normal.  Left Ear: External ear normal.  Nose: Nose normal.  Mouth/Throat: Oropharynx is clear and moist.  Eyes: EOM are normal. Pupils are equal, round, and reactive to light.  Neck: Normal range of motion. Neck supple. No thyromegaly present.  Cardiovascular: Normal rate, regular rhythm, normal heart sounds and intact distal pulses.  Exam reveals no gallop and no friction rub.   No murmur heard. Pulmonary/Chest: Effort normal and breath sounds normal.  Abdominal: Soft. Bowel sounds are normal. She exhibits no distension and no mass. There is no tenderness. There is no rebound.  Genitourinary:       Pelvic exam deferred she had  one last year and was normal  Bilateral breast exam normal  Musculoskeletal: Normal range of motion.  Lymphadenopathy:    She has no cervical adenopathy.  Neurological: She is alert. She has normal reflexes. No cranial nerve deficit. She exhibits normal muscle tone. Coordination normal.  Skin: Skin is warm and dry.  Psychiatric: She has a normal mood and affect. Her behavior is normal. Judgment and thought content normal.          Assessment & Plan:  Healthy female  Recurrent low back pain plan tramadol and Flexeril when necessary  Hypertension continue lisinopril 10 mg daily  History of mild osteoporosis continue vitamin D calcium and daily exercise.

## 2012-08-18 ENCOUNTER — Ambulatory Visit
Admission: RE | Admit: 2012-08-18 | Discharge: 2012-08-18 | Disposition: A | Discharge status: Home / Self Care | Payer: Medicare Other | Source: Ambulatory Visit | Attending: Family Medicine | Admitting: Family Medicine

## 2012-08-18 DIAGNOSIS — Z1231 Encounter for screening mammogram for malignant neoplasm of breast: Secondary | ICD-10-CM

## 2012-11-23 DIAGNOSIS — H40019 Open angle with borderline findings, low risk, unspecified eye: Secondary | ICD-10-CM | POA: Diagnosis not present

## 2013-04-14 ENCOUNTER — Other Ambulatory Visit: Payer: Self-pay | Admitting: *Deleted

## 2013-04-14 MED ORDER — TIZANIDINE HCL 4 MG PO TABS: 4.0000 mg | ORAL_TABLET | Freq: Every evening | ORAL | Status: DC | PRN

## 2013-04-14 NOTE — Telephone Encounter (Signed)
Flexeril is not covered by insurance so Rx for tizanidine has been sent. Left message on machine for patient

## 2013-06-08 ENCOUNTER — Encounter: Payer: Self-pay | Admitting: *Deleted

## 2013-06-10 ENCOUNTER — Encounter: Payer: Self-pay | Admitting: Gastroenterology

## 2013-07-18 ENCOUNTER — Other Ambulatory Visit: Payer: Self-pay

## 2013-07-18 DIAGNOSIS — Z1231 Encounter for screening mammogram for malignant neoplasm of breast: Secondary | ICD-10-CM

## 2013-08-04 ENCOUNTER — Encounter: Payer: Self-pay | Admitting: Family Medicine

## 2013-08-04 ENCOUNTER — Ambulatory Visit (INDEPENDENT_AMBULATORY_CARE_PROVIDER_SITE_OTHER): Payer: Medicare Other | Admitting: Family Medicine

## 2013-08-04 VITALS — BP 130/80 | Temp 98.6°F | Ht 62.0 in | Wt 148.0 lb

## 2013-08-04 DIAGNOSIS — Z23 Encounter for immunization: Secondary | ICD-10-CM | POA: Diagnosis not present

## 2013-08-04 DIAGNOSIS — Z0000 Encounter for general adult medical examination without abnormal findings: Secondary | ICD-10-CM | POA: Diagnosis not present

## 2013-08-04 DIAGNOSIS — M810 Age-related osteoporosis without current pathological fracture: Secondary | ICD-10-CM

## 2013-08-04 DIAGNOSIS — J309 Allergic rhinitis, unspecified: Secondary | ICD-10-CM

## 2013-08-04 DIAGNOSIS — I10 Essential (primary) hypertension: Secondary | ICD-10-CM | POA: Diagnosis not present

## 2013-08-04 DIAGNOSIS — G8929 Other chronic pain: Secondary | ICD-10-CM

## 2013-08-04 DIAGNOSIS — M549 Dorsalgia, unspecified: Secondary | ICD-10-CM

## 2013-08-04 LAB — BASIC METABOLIC PANEL
CO2: 30 mEq/L (ref 19–32)
Chloride: 102 mEq/L (ref 96–112)
Creatinine, Ser: 0.7 mg/dL (ref 0.4–1.2)
Potassium: 5.3 mEq/L — ABNORMAL HIGH (ref 3.5–5.1)

## 2013-08-04 LAB — POCT URINALYSIS DIPSTICK
Bilirubin, UA: NEGATIVE
Ketones, UA: NEGATIVE
Leukocytes, UA: NEGATIVE
Protein, UA: NEGATIVE
Spec Grav, UA: 1.015

## 2013-08-04 LAB — CBC WITH DIFFERENTIAL/PLATELET
Basophils Relative: 0.3 % (ref 0.0–3.0)
Eosinophils Relative: 2.4 % (ref 0.0–5.0)
Hemoglobin: 14.5 g/dL (ref 12.0–15.0)
Lymphs Abs: 1.8 10*3/uL (ref 0.7–4.0)
MCHC: 33.6 g/dL (ref 30.0–36.0)
Monocytes Absolute: 0.3 10*3/uL (ref 0.1–1.0)
Monocytes Relative: 6.8 % (ref 3.0–12.0)

## 2013-08-04 LAB — TSH: TSH: 1.78 u[IU]/mL (ref 0.35–5.50)

## 2013-08-04 MED ORDER — TRAMADOL HCL 50 MG PO TABS: ORAL_TABLET | ORAL | Status: DC

## 2013-08-04 MED ORDER — CYCLOBENZAPRINE HCL 10 MG PO TABS: ORAL_TABLET | ORAL | Status: DC

## 2013-08-04 MED ORDER — LISINOPRIL 10 MG PO TABS: ORAL_TABLET | ORAL | Status: DC

## 2013-08-04 NOTE — Progress Notes (Signed)
  Subjective:    Patient ID: Tricia Newman, female    DOB: [birth year], 72 y.o.   MRN: [id number]  HPI Tricia Newman is a 72 year old married female nonsmoker who comes in today for a Medicare wellness examination because of a history of mild osteoporosis, hypertension, allergic rhinitis, chronic back pain  Her medications reviewed the been no changes except she was switched from Flexeril to Zanaflex. The Flexeril is her preferred medication  She gets routine eye care, dental care, BSE monthly, and you mammography, last colonoscopy 2004 she did recently get a recall card from Dr. Jarold Motto. 6  She's due for Pneumovax and a flu shot today. All other vaccinations are up-to-date  Cognitive function normal she exercises and tends to regard and on a daily basis home health safety reviewed no issues identified, no guns in the house, she does have a health care power of attorney and living well  The Flexeril and tramadol she takes one of each or half of each at bedtime when she has a lot of thoracic and cervical neck pain. Again the medicines are when necessary   Review of Systems  Constitutional: Negative.   HENT: Negative.   Eyes: Negative.   Respiratory: Negative.   Cardiovascular: Negative.   Gastrointestinal: Negative.   Endocrine: Negative.   Genitourinary: Negative.   Musculoskeletal: Negative.   Allergic/Immunologic: Negative.   Neurological: Negative.   Hematological: Negative.   Psychiatric/Behavioral: Negative.        Objective:   Physical Exam  Constitutional: She appears well-developed and well-nourished.  HENT:  Head: Normocephalic and atraumatic.  Right Ear: External ear normal.  Left Ear: External ear normal.  Nose: Nose normal.  Mouth/Throat: Oropharynx is clear and moist.  Eyes: EOM are normal. Pupils are equal, round, and reactive to light.  Neck: Normal range of motion. Neck supple. No thyromegaly present.  Cardiovascular: Normal rate, regular rhythm, normal heart  sounds and intact distal pulses.  Exam reveals no gallop and no friction rub.   No murmur heard. No carotid aortic bruits peripheral pulses 2+ and symmetrical  Pulmonary/Chest: Effort normal and breath sounds normal.  Abdominal: Soft. Bowel sounds are normal. She exhibits no distension and no mass. There is no tenderness. There is no rebound.  Genitourinary: Vagina normal and uterus normal. Guaiac negative stool. No vaginal discharge found.  Bilateral breast exam normal  Pelvic normal Pap every 3 years  Musculoskeletal: Normal range of motion.  Lymphadenopathy:    She has no cervical adenopathy.  Neurological: She is alert. She has normal reflexes. No cranial nerve deficit. She exhibits normal muscle tone. Coordination normal.  Skin: Skin is warm and dry.  Psychiatric: She has a normal mood and affect. Her behavior is normal. Judgment and thought content normal.          Assessment & Plan:  Healthy female  Hypertension at goal continue lisinopril 10 mg daily  History of osteoporosis continue calcium vitamin D and daily exercise  Allergic rhinitis rhinitis continue over-the-counter Claritin plain  Chronic neck and back pain Flexeril and tramadol each bedtime when necessary

## 2013-08-04 NOTE — Patient Instructions (Signed)
Continue your current medications  Flexeril and tramadol,,,,,,,,,, one half of each at bedtime when necessary  Return in one year for your annual Medicare wellness examination sooner if any problems  And thanks again for all the vegetables that you bring [place] from your garden in the summer

## 2013-08-19 ENCOUNTER — Ambulatory Visit
Admission: RE | Admit: 2013-08-19 | Discharge: 2013-08-19 | Disposition: A | Discharge status: Home / Self Care | Payer: Medicare Other | Source: Ambulatory Visit

## 2013-08-19 DIAGNOSIS — Z1231 Encounter for screening mammogram for malignant neoplasm of breast: Secondary | ICD-10-CM

## 2013-08-22 ENCOUNTER — Encounter: Payer: Self-pay | Admitting: Gastroenterology

## 2013-08-31 ENCOUNTER — Other Ambulatory Visit: Payer: Self-pay | Admitting: Family Medicine

## 2013-09-04 ENCOUNTER — Other Ambulatory Visit: Payer: Self-pay | Admitting: Family Medicine

## 2013-09-05 ENCOUNTER — Ambulatory Visit (AMBULATORY_SURGERY_CENTER): Payer: Self-pay

## 2013-09-05 VITALS — Ht 62.0 in | Wt 148.0 lb

## 2013-09-05 DIAGNOSIS — Z8371 Family history of colonic polyps: Secondary | ICD-10-CM

## 2013-09-05 DIAGNOSIS — Z83719 Family history of colon polyps, unspecified: Secondary | ICD-10-CM

## 2013-09-05 MED ORDER — MOVIPREP 100 G PO SOLR: 1.0000 | Freq: Once | ORAL | Status: DC

## 2013-09-06 ENCOUNTER — Other Ambulatory Visit: Payer: Self-pay | Admitting: Family Medicine

## 2013-09-26 ENCOUNTER — Encounter: Payer: Self-pay | Admitting: Gastroenterology

## 2013-09-26 ENCOUNTER — Ambulatory Visit (AMBULATORY_SURGERY_CENTER): Payer: Medicare Other | Admitting: Gastroenterology

## 2013-09-26 VITALS — BP 157/79 | HR 87 | Temp 97.9°F | Resp 23 | Ht 62.0 in | Wt 148.0 lb

## 2013-09-26 DIAGNOSIS — F411 Generalized anxiety disorder: Secondary | ICD-10-CM | POA: Diagnosis not present

## 2013-09-26 DIAGNOSIS — Z8371 Family history of colonic polyps: Secondary | ICD-10-CM | POA: Diagnosis not present

## 2013-09-26 DIAGNOSIS — Z83719 Family history of colon polyps, unspecified: Secondary | ICD-10-CM

## 2013-09-26 DIAGNOSIS — I10 Essential (primary) hypertension: Secondary | ICD-10-CM | POA: Diagnosis not present

## 2013-09-26 DIAGNOSIS — Z1211 Encounter for screening for malignant neoplasm of colon: Secondary | ICD-10-CM

## 2013-09-26 DIAGNOSIS — D126 Benign neoplasm of colon, unspecified: Secondary | ICD-10-CM | POA: Diagnosis not present

## 2013-09-26 MED ORDER — SODIUM CHLORIDE 0.9 % IV SOLN: 500.0000 mL | INTRAVENOUS | Status: DC

## 2013-09-26 NOTE — Progress Notes (Signed)
Called to room to assist during endoscopic procedure.  Patient ID and intended procedure confirmed with present staff. Received instructions for my participation in the procedure from the performing physician. ewm 

## 2013-09-26 NOTE — Progress Notes (Signed)
Report to pacu rn, vss, bbs=clear 

## 2013-09-26 NOTE — Patient Instructions (Signed)
YOU HAD AN ENDOSCOPIC PROCEDURE TODAY AT THE [facility]: Refer to the procedure report that was given to you for any specific questions about what was found during the examination.  If the procedure report does not answer your questions, please call your gastroenterologist to clarify.  If you requested that your care partner not be given the details of your procedure findings, then the procedure report has been included in a sealed envelope for you to review at your convenience later.  YOU SHOULD EXPECT: Some feelings of bloating in the abdomen. Passage of more gas than usual.  Walking can help get rid of the air that was put into your GI tract during the procedure and reduce the bloating. If you had a lower endoscopy (such as a colonoscopy or flexible sigmoidoscopy) you may notice spotting of blood in your stool or on the toilet paper. If you underwent a bowel prep for your procedure, then you may not have a normal bowel movement for a few days.  DIET: Your first meal following the procedure should be a light meal and then it is ok to progress to your normal diet.  A half-sandwich or bowl of soup is an example of a good first meal.  Heavy or fried foods are harder to digest and may make you feel nauseous or bloated.  Likewise meals heavy in dairy and vegetables can cause extra gas to form and this can also increase the bloating.  Drink plenty of fluids but you should avoid alcoholic beverages for 24 hours.  ACTIVITY: Your care partner should take you home directly after the procedure.  You should plan to take it easy, moving slowly for the rest of the day.  You can resume normal activity the day after the procedure however you should NOT DRIVE or use heavy machinery for 24 hours (because of the sedation medicines used during the test).    SYMPTOMS TO REPORT IMMEDIATELY: A gastroenterologist can be reached at any hour.  During normal business hours, 8:30 AM to 5:00 PM Monday through Friday,  call [phone].  After hours and on weekends, please call the GI answering service at [phone] who will take a message and have the physician on call contact you.   Following lower endoscopy (colonoscopy or flexible sigmoidoscopy):  Excessive amounts of blood in the stool  Significant tenderness or worsening of abdominal pains  Swelling of the abdomen that is new, acute  Fever of 100F or higher   FOLLOW UP: If any biopsies were taken you will be contacted by phone or by letter within the next 1-3 weeks.  Call your gastroenterologist if you have not heard about the biopsies in 3 weeks.  Our staff will call the home number listed on your records the next business day following your procedure to check on you and address any questions or concerns that you may have at that time regarding the information given to you following your procedure. This is a courtesy call and so if there is no answer at the home number and we have not heard from you through the emergency physician on call, we will assume that you have returned to your regular daily activities without incident.  SIGNATURES/CONFIDENTIALITY: You and/or your care partner have signed paperwork which will be entered into your electronic medical record.  These signatures attest to the fact that that the information above on your After Visit Summary has been reviewed and is understood.  Full responsibility of the confidentiality of   this discharge information lies with you and/or your care-partner.   INFORMATION ON POLYPS GIVEN TO YOU TODAY 

## 2013-09-26 NOTE — Progress Notes (Signed)
Patient did not experience any of the following events: a burn prior to discharge; a fall within the facility; wrong site/side/patient/procedure/implant event; or a hospital transfer or hospital admission upon discharge from the facility. (G8907) Patient did not have preoperative order for IV antibiotic SSI prophylaxis. (G8918)  

## 2013-09-26 NOTE — Op Note (Signed)
[facility] [address] [place], [address]   COLONOSCOPY PROCEDURE REPORT  PATIENT: Tricia Newman, Tricia Newman  MR#: [id number] BIRTHDATE: Dec 12, 1940 , 72  yrs. old GENDER: Female ENDOSCOPIST: Mardella Layman, MD, [facility] REFERRED BY: PROCEDURE DATE:  09/26/2013 PROCEDURE:   Colonoscopy with snare polypectomy First Screening Colonoscopy - Avg.  risk and is 50 yrs.  old or older - No.      History of Adenoma - Now for follow-up colonoscopy & has been > or = to 3 yrs.  N/A  Polyps Removed Today? Yes. ASA CLASS:   Class II INDICATIONS:average risk screening. MEDICATIONS: propofol (Diprivan) 300mg  IV  DESCRIPTION OF PROCEDURE:   After the risks benefits and alternatives of the procedure were thoroughly explained, informed consent was obtained.  A digital rectal exam revealed no abnormalities of the rectum.   The LB [id number]  endoscope was introduced through the anus and advanced to the cecum, which was identified by both the appendix and ileocecal valve. No adverse events experienced.   Limited by a tortuous and redundant colon. The quality of the prep was Moviprep fair  The instrument was then slowly withdrawn as the colon was fully examined.      COLON FINDINGS: A smooth flat polyp was found at the hepatic flexure.  A polypectomy was performed with a cold snare.  The resection was complete and the polyp tissue was completely retrieved.  Retroflexed views revealed no abnormalities. The time to cecum=6 minutes 14 seconds.  Withdrawal time=7 minutes 40 seconds.  The scope was withdrawn and the procedure completed. COMPLICATIONS: There were no complications.  ENDOSCOPIC IMPRESSION: Flat polyp was found at the hepatic flexure; polypectomy was performed with a cold snare...very redundant and tortuous colon./  RECOMMENDATIONS: 1.  Await pathology results 2.  Continue current medications   eSigned:  Mardella Layman, MD, [facility] 09/26/2013 8:55 AM   cc:  Roderick Pee, MD

## 2013-09-27 ENCOUNTER — Telehealth: Payer: Self-pay | Admitting: *Deleted

## 2013-09-27 NOTE — Telephone Encounter (Signed)
  Follow up Call-  Call back number 09/26/2013  Post procedure Call Back phone  # [phone]  Permission to leave phone message Yes     Patient questions:  Do you have a fever, pain , or abdominal swelling? no Pain Score  0 *  Have you tolerated food without any problems? yes  Have you been able to return to your normal activities? yes  Do you have any questions about your discharge instructions: Diet   no Medications  no Follow up visit  no  Do you have questions or concerns about your Care? no  Actions: * If pain score is 4 or above: No action needed, pain <4.

## 2013-09-30 ENCOUNTER — Encounter: Payer: Self-pay | Admitting: Gastroenterology

## 2013-11-29 DIAGNOSIS — H18419 Arcus senilis, unspecified eye: Secondary | ICD-10-CM | POA: Diagnosis not present

## 2013-11-29 DIAGNOSIS — H2510 Age-related nuclear cataract, unspecified eye: Secondary | ICD-10-CM | POA: Diagnosis not present

## 2013-11-29 DIAGNOSIS — H04129 Dry eye syndrome of unspecified lacrimal gland: Secondary | ICD-10-CM | POA: Diagnosis not present

## 2013-11-29 DIAGNOSIS — H02839 Dermatochalasis of unspecified eye, unspecified eyelid: Secondary | ICD-10-CM | POA: Diagnosis not present

## 2014-03-31 ENCOUNTER — Other Ambulatory Visit: Payer: Self-pay | Admitting: Family Medicine

## 2014-04-25 ENCOUNTER — Other Ambulatory Visit: Payer: Self-pay | Admitting: Family Medicine

## 2014-08-02 ENCOUNTER — Other Ambulatory Visit: Payer: Self-pay

## 2014-08-02 DIAGNOSIS — Z1239 Encounter for other screening for malignant neoplasm of breast: Secondary | ICD-10-CM

## 2014-08-02 DIAGNOSIS — Z1231 Encounter for screening mammogram for malignant neoplasm of breast: Secondary | ICD-10-CM

## 2014-08-09 ENCOUNTER — Encounter: Payer: Self-pay | Admitting: Family Medicine

## 2014-08-09 ENCOUNTER — Ambulatory Visit (INDEPENDENT_AMBULATORY_CARE_PROVIDER_SITE_OTHER): Payer: Medicare Other | Admitting: Family Medicine

## 2014-08-09 VITALS — BP 130/90 | Temp 98.6°F | Ht 61.75 in | Wt 150.0 lb

## 2014-08-09 DIAGNOSIS — I10 Essential (primary) hypertension: Secondary | ICD-10-CM | POA: Diagnosis not present

## 2014-08-09 DIAGNOSIS — Z23 Encounter for immunization: Secondary | ICD-10-CM | POA: Diagnosis not present

## 2014-08-09 DIAGNOSIS — G8929 Other chronic pain: Secondary | ICD-10-CM

## 2014-08-09 DIAGNOSIS — M549 Dorsalgia, unspecified: Secondary | ICD-10-CM

## 2014-08-09 LAB — CBC WITH DIFFERENTIAL/PLATELET
Basophils Absolute: 0 K/uL (ref 0.0–0.1)
Basophils Relative: 0.4 % (ref 0.0–3.0)
Eosinophils Absolute: 0.1 K/uL (ref 0.0–0.7)
Eosinophils Relative: 2.9 % (ref 0.0–5.0)
HCT: 42.7 % (ref 36.0–46.0)
Hemoglobin: 13.9 g/dL (ref 12.0–15.0)
Lymphocytes Relative: 43.4 % (ref 12.0–46.0)
Lymphs Abs: 2 K/uL (ref 0.7–4.0)
MCHC: 32.5 g/dL (ref 30.0–36.0)
MCV: 87.6 fl (ref 78.0–100.0)
Monocytes Absolute: 0.4 K/uL (ref 0.1–1.0)
Monocytes Relative: 7.6 % (ref 3.0–12.0)
Neutro Abs: 2.1 K/uL (ref 1.4–7.7)
Neutrophils Relative %: 45.7 % (ref 43.0–77.0)
Platelets: 186 K/uL (ref 150.0–400.0)
RBC: 4.88 Mil/uL (ref 3.87–5.11)
RDW: 14.8 % (ref 11.5–15.5)
WBC: 4.7 K/uL (ref 4.0–10.5)

## 2014-08-09 LAB — BASIC METABOLIC PANEL
BUN: 22 mg/dL (ref 6–23)
CO2: 30 mEq/L (ref 19–32)
Calcium: 9.8 mg/dL (ref 8.4–10.5)
Chloride: 105 mEq/L (ref 96–112)
Creatinine, Ser: 0.7 mg/dL (ref 0.4–1.2)
GFR: 84.02 mL/min (ref >60.00)
Glucose, Bld: 78 mg/dL (ref 70–99)
POTASSIUM: 5.5 meq/L — AB (ref 3.5–5.1)
SODIUM: 143 meq/L (ref 135–145)

## 2014-08-09 LAB — POCT URINALYSIS DIPSTICK
Bilirubin, UA: NEGATIVE
GLUCOSE UA: NEGATIVE
Ketones, UA: NEGATIVE
Leukocytes, UA: NEGATIVE
Nitrite, UA: NEGATIVE
PH UA: 7
Protein, UA: NEGATIVE
RBC UA: NEGATIVE
SPEC GRAV UA: 1.015
Urobilinogen, UA: 0.2

## 2014-08-09 LAB — TSH: TSH: 2.03 u[IU]/mL (ref 0.35–4.50)

## 2014-08-09 MED ORDER — LISINOPRIL 10 MG PO TABS: ORAL_TABLET | ORAL | Status: DC

## 2014-08-09 NOTE — Patient Instructions (Signed)
Lisinopril 10 mg......Marland Kitchen 1 daily in the morning for high blood pressure........... if you get a very low reading been cut the dose in half for couple days then go back to a full tablet  Tramadol and Zanaflex.......... one half to one of each at bedtime when necessary for back pain  Walk 30 minutes daily  BSE monthly and mammography every other year  Return in one year for followup on your blood pressure sooner if any problems.

## 2014-08-09 NOTE — Progress Notes (Signed)
   Subjective:    Patient ID: Tricia Newman, female    DOB: [birth year], 73 y.o.   MRN: [id number]  HPI [name] is a 73 year old widowed female nonsmoker who comes in today for evaluation of hypertension and back pain  She has a history of recurrent lumbar disc disease and does a lot of running your work therefore she has some back pain every now and then for which he takes some Flexeril and tramadol when necessary  She takes lisinopril 20 mg daily for hypertension BP 130/90. We talked about getting her blood pressure below 85 diastolic. She says at times her blood pressure would drop to 90/60 and she'll hold her medication for a day then it jumped up to 716R systolic. Advised not to stop her medication but to cut it in half if she gets along number.  She also has allergic rhinitis which she takes over-the-counter Claritin plain  She gets routine eye care, dental care, BSE monthly, and you mammography, colonoscopy 2014 showed one polyp.  Cognitive function normal she exercises on a regular basis home health safety reviewed no issues identified, no guns in the house, she does have a health care power of attorney and living well   Review of Systems  Constitutional: Negative.   HENT: Negative.   Eyes: Negative.   Respiratory: Negative.   Cardiovascular: Negative.   Gastrointestinal: Negative.   Endocrine: Negative.   Genitourinary: Negative.   Musculoskeletal: Negative.   Skin: Negative.   Allergic/Immunologic: Negative.   Neurological: Negative.   Hematological: Negative.   Psychiatric/Behavioral: Negative.        Objective:   Physical Exam  Nursing note and vitals reviewed. Constitutional: She appears well-developed and well-nourished.  HENT:  Head: Normocephalic and atraumatic.  Right Ear: External ear normal.  Left Ear: External ear normal.  Nose: Nose normal.  Mouth/Throat: Oropharynx is clear and moist.  Eyes: EOM are normal. Pupils are equal, round, and reactive to  light.  Neck: Normal range of motion. Neck supple. No JVD present. No tracheal deviation present. No thyromegaly present.  Cardiovascular: Normal rate, regular rhythm, normal heart sounds and intact distal pulses.  Exam reveals no gallop and no friction rub.   No murmur heard. No carotid artery bruits peripheral pulses 2+ and symmetrical  Pulmonary/Chest: Effort normal and breath sounds normal. No stridor. No respiratory distress. She has no wheezes. She has no rales. She exhibits no tenderness.  Abdominal: Soft. Bowel sounds are normal. She exhibits no distension and no mass. There is no tenderness. There is no rebound and no guarding.  Genitourinary:  Bilateral breast exam normal  Pelvic and Pap 2 years ago normal therefore wait for another year. GYN-wise she is asymptomatic  Musculoskeletal: Normal range of motion.  Lymphadenopathy:    She has no cervical adenopathy.  Neurological: She is alert. She has normal reflexes. No cranial nerve deficit. She exhibits normal muscle tone. Coordination normal.  Skin: Skin is warm and dry. No rash noted. No erythema. No pallor.  Psychiatric: She has a normal mood and affect. Her behavior is normal. Judgment and thought content normal.          Assessment & Plan:  Healthy female  Hypertension at goal continue current therapy  History of recurrent lumbar back pain........ tramadol and Flexeril when necessary  Allergic rhinitis..... OTC Claritin plain

## 2014-08-09 NOTE — Progress Notes (Signed)
Pre visit review using our clinic review tool, if applicable. No additional management support is needed unless otherwise documented below in the visit note. 

## 2014-08-10 ENCOUNTER — Telehealth: Payer: Self-pay | Admitting: Family Medicine

## 2014-08-10 NOTE — Telephone Encounter (Signed)
emmi mailed  °

## 2014-08-28 ENCOUNTER — Ambulatory Visit: Payer: Medicare Other

## 2014-11-06 ENCOUNTER — Other Ambulatory Visit: Payer: Self-pay | Admitting: *Deleted

## 2014-11-06 DIAGNOSIS — I10 Essential (primary) hypertension: Secondary | ICD-10-CM

## 2014-11-06 MED ORDER — LISINOPRIL 10 MG PO TABS: ORAL_TABLET | ORAL | Status: DC

## 2014-12-19 DIAGNOSIS — H04123 Dry eye syndrome of bilateral lacrimal glands: Secondary | ICD-10-CM | POA: Diagnosis not present

## 2014-12-19 DIAGNOSIS — H18413 Arcus senilis, bilateral: Secondary | ICD-10-CM | POA: Diagnosis not present

## 2014-12-19 DIAGNOSIS — H2513 Age-related nuclear cataract, bilateral: Secondary | ICD-10-CM | POA: Diagnosis not present

## 2014-12-19 DIAGNOSIS — H2511 Age-related nuclear cataract, right eye: Secondary | ICD-10-CM | POA: Diagnosis not present

## 2015-07-24 ENCOUNTER — Other Ambulatory Visit: Payer: Self-pay | Admitting: Family Medicine

## 2015-07-24 ENCOUNTER — Ambulatory Visit (INDEPENDENT_AMBULATORY_CARE_PROVIDER_SITE_OTHER): Payer: Medicare Other

## 2015-07-24 DIAGNOSIS — Z23 Encounter for immunization: Secondary | ICD-10-CM | POA: Diagnosis not present

## 2015-08-09 ENCOUNTER — Ambulatory Visit: Payer: Medicare Other

## 2015-08-17 ENCOUNTER — Ambulatory Visit
Admission: RE | Admit: 2015-08-17 | Discharge: 2015-08-17 | Disposition: A | Discharge status: Home / Self Care | Payer: Medicare Other | Source: Ambulatory Visit

## 2015-08-17 DIAGNOSIS — Z1231 Encounter for screening mammogram for malignant neoplasm of breast: Secondary | ICD-10-CM | POA: Diagnosis not present

## 2015-08-27 ENCOUNTER — Encounter: Payer: Medicare Other | Admitting: Family Medicine

## 2015-09-11 ENCOUNTER — Encounter: Payer: Self-pay | Admitting: Family Medicine

## 2015-09-11 ENCOUNTER — Ambulatory Visit (INDEPENDENT_AMBULATORY_CARE_PROVIDER_SITE_OTHER): Payer: Medicare Other | Admitting: Family Medicine

## 2015-09-11 VITALS — BP 130/80 | Temp 98.6°F | Ht 62.5 in | Wt 152.0 lb

## 2015-09-11 DIAGNOSIS — M549 Dorsalgia, unspecified: Secondary | ICD-10-CM

## 2015-09-11 DIAGNOSIS — G8929 Other chronic pain: Secondary | ICD-10-CM | POA: Diagnosis not present

## 2015-09-11 DIAGNOSIS — I10 Essential (primary) hypertension: Secondary | ICD-10-CM

## 2015-09-11 LAB — CBC WITH DIFFERENTIAL/PLATELET
BASOS PCT: 0.5 % (ref 0.0–3.0)
Basophils Absolute: 0 10*3/uL (ref 0.0–0.1)
Eosinophils Absolute: 0.1 10*3/uL (ref 0.0–0.7)
Eosinophils Relative: 3 % (ref 0.0–5.0)
HEMATOCRIT: 41.5 % (ref 36.0–46.0)
HEMOGLOBIN: 13.7 g/dL (ref 12.0–15.0)
LYMPHS PCT: 44.7 % (ref 12.0–46.0)
Lymphs Abs: 2 10*3/uL (ref 0.7–4.0)
MCHC: 33 g/dL (ref 30.0–36.0)
MCV: 86.5 fl (ref 78.0–100.0)
MONOS PCT: 6.8 % (ref 3.0–12.0)
Monocytes Absolute: 0.3 10*3/uL (ref 0.1–1.0)
NEUTROS ABS: 2 10*3/uL (ref 1.4–7.7)
Neutrophils Relative %: 45 % (ref 43.0–77.0)
PLATELETS: 177 10*3/uL (ref 150.0–400.0)
RBC: 4.79 Mil/uL (ref 3.87–5.11)
RDW: 14.5 % (ref 11.5–15.5)
WBC: 4.5 10*3/uL (ref 4.0–10.5)

## 2015-09-11 LAB — BASIC METABOLIC PANEL
BUN: 21 mg/dL (ref 6–23)
CALCIUM: 9.6 mg/dL (ref 8.4–10.5)
CHLORIDE: 104 meq/L (ref 96–112)
CO2: 30 meq/L (ref 19–32)
Creatinine, Ser: 0.61 mg/dL (ref 0.40–1.20)
GFR: 101.44 mL/min (ref >60.00)
GLUCOSE: 90 mg/dL (ref 70–99)
Potassium: 5 mEq/L (ref 3.5–5.1)
Sodium: 141 mEq/L (ref 135–145)

## 2015-09-11 LAB — POCT URINALYSIS DIPSTICK
BILIRUBIN UA: NEGATIVE
GLUCOSE UA: NEGATIVE
Ketones, UA: NEGATIVE
Leukocytes, UA: NEGATIVE
NITRITE UA: NEGATIVE
Protein, UA: NEGATIVE
RBC UA: NEGATIVE
Spec Grav, UA: 1.015
UROBILINOGEN UA: 0.2
pH, UA: 7.5

## 2015-09-11 LAB — TSH: TSH: 3.06 u[IU]/mL (ref 0.35–4.50)

## 2015-09-11 MED ORDER — LISINOPRIL 10 MG PO TABS: 10.0000 mg | ORAL_TABLET | Freq: Every morning | ORAL | Status: DC

## 2015-09-11 MED ORDER — TIZANIDINE HCL 4 MG PO TABS: 4.0000 mg | ORAL_TABLET | Freq: Every evening | ORAL | Status: DC | PRN

## 2015-09-11 MED ORDER — TRAMADOL HCL 50 MG PO TABS: ORAL_TABLET | ORAL | Status: DC

## 2015-09-11 NOTE — Patient Instructions (Signed)
Continue current medications  Continue good diet and exercise program  Zanaflex and tramadol when necessary for back pain  Tommi Rumps or Almyra Free,,,,, or 2 new adult nurse practitioners,,,,,,,, Dr. [name]  When you: August for your physical examination November pick one of the new folks for your physical and for long-term care

## 2015-09-11 NOTE — Progress Notes (Signed)
Pre visit review using our clinic review tool, if applicable. No additional management support is needed unless otherwise documented below in the visit note. 

## 2015-09-11 NOTE — Progress Notes (Signed)
   Subjective:    Patient ID: Tricia Newman, female    DOB: 06/29/1941, 74 y.o.   MRN: [id number]  HPI Tricia Newman is a 74 year old single female nonsmoker who comes in today for general physical examination because of a history of hypertension  She's on lisinopril 10 mg daily BP 130/80  She takes AcipHex and Ultram when necessary for chronic back pain. We x-rayed her back about 7 or 8 years ago. She has some's fairly significant degenerative joint changes. Since she is otherwise well and gets over her episodes with the medication don't feel any further workup or neurosurgical consult is indicated at this time  She gets routine eye care, dental care, BSE monthly, annual mammography, colonoscopy 2015 normal  Vaccinations up-to-date  Cognitive function normal she walks on a daily basis home health safety reviewed no issues identified, no guns in the house, she does have a healthcare power of attorney and living well  Social history she single female,,,,,,, works in her garden,,,,,,,, and brings me fresh produce every summer   Review of Systems  Constitutional: Negative.   HENT: Negative.   Eyes: Negative.   Respiratory: Negative.   Cardiovascular: Negative.   Gastrointestinal: Negative.   Endocrine: Negative.   Genitourinary: Negative.   Musculoskeletal: Negative.   Skin: Negative.   Allergic/Immunologic: Negative.   Neurological: Negative.   Hematological: Negative.   Psychiatric/Behavioral: Negative.        Objective:   Physical Exam  Constitutional: She appears well-developed and well-nourished.  HENT:  Head: Normocephalic and atraumatic.  Right Ear: External ear normal.  Left Ear: External ear normal.  Nose: Nose normal.  Mouth/Throat: Oropharynx is clear and moist.  Eyes: EOM are normal. Pupils are equal, round, and reactive to light.  Neck: Normal range of motion. Neck supple. No JVD present. No tracheal deviation present. No thyromegaly present.  Cardiovascular:  Normal rate, regular rhythm, normal heart sounds and intact distal pulses.  Exam reveals no gallop and no friction rub.   No murmur heard. No carotid neurologic bruits peripheral pulses 1+ and symmetrical  Pulmonary/Chest: Effort normal and breath sounds normal. No stridor. No respiratory distress. She has no wheezes. She has no rales. She exhibits no tenderness.  Abdominal: Soft. Bowel sounds are normal. She exhibits no distension and no mass. There is no tenderness. There is no rebound and no guarding.  Genitourinary:  Bilateral breast exam normal  Past are 2 years sugar normal therefore not repeated  Musculoskeletal: Normal range of motion.  Lymphadenopathy:    She has no cervical adenopathy.  Neurological: She is alert. She has normal reflexes. No cranial nerve deficit. She exhibits normal muscle tone. Coordination normal.  Skin: Skin is warm and dry. No rash noted. No erythema. No pallor.  Total body skin exam normal  Psychiatric: She has a normal mood and affect. Her behavior is normal. Judgment and thought content normal.  Nursing note and vitals reviewed.         Assessment & Plan:  Healthy female  Hypertension at goal... Continue current therapy  Chronic back pain....... continue Zanaflex and tramadol when necessary

## 2015-12-25 DIAGNOSIS — H2513 Age-related nuclear cataract, bilateral: Secondary | ICD-10-CM | POA: Diagnosis not present

## 2015-12-25 DIAGNOSIS — H18413 Arcus senilis, bilateral: Secondary | ICD-10-CM | POA: Diagnosis not present

## 2015-12-25 DIAGNOSIS — H2511 Age-related nuclear cataract, right eye: Secondary | ICD-10-CM | POA: Diagnosis not present

## 2015-12-25 DIAGNOSIS — H04123 Dry eye syndrome of bilateral lacrimal glands: Secondary | ICD-10-CM | POA: Diagnosis not present

## 2016-06-27 ENCOUNTER — Encounter (HOSPITAL_BASED_OUTPATIENT_CLINIC_OR_DEPARTMENT_OTHER): Payer: Self-pay | Admitting: Emergency Medicine

## 2016-06-27 ENCOUNTER — Emergency Department (HOSPITAL_BASED_OUTPATIENT_CLINIC_OR_DEPARTMENT_OTHER)
Admission: EM | Admit: 2016-06-27 | Discharge: 2016-06-27 | Disposition: A | Discharge status: Home / Self Care | Payer: Medicare Other | Attending: Emergency Medicine | Admitting: Emergency Medicine

## 2016-06-27 DIAGNOSIS — I10 Essential (primary) hypertension: Secondary | ICD-10-CM | POA: Diagnosis not present

## 2016-06-27 DIAGNOSIS — Z79899 Other long term (current) drug therapy: Secondary | ICD-10-CM | POA: Diagnosis not present

## 2016-06-27 DIAGNOSIS — Z87891 Personal history of nicotine dependence: Secondary | ICD-10-CM | POA: Insufficient documentation

## 2016-06-27 DIAGNOSIS — N390 Urinary tract infection, site not specified: Secondary | ICD-10-CM | POA: Diagnosis not present

## 2016-06-27 DIAGNOSIS — R319 Hematuria, unspecified: Secondary | ICD-10-CM | POA: Diagnosis present

## 2016-06-27 LAB — URINALYSIS, ROUTINE W REFLEX MICROSCOPIC
BILIRUBIN URINE: NEGATIVE
GLUCOSE, UA: NEGATIVE mg/dL
KETONES UR: 15 mg/dL — AB
Nitrite: NEGATIVE
PH: 7 (ref 5.0–8.0)
Protein, ur: 100 mg/dL — AB
SPECIFIC GRAVITY, URINE: 1.017 (ref 1.005–1.030)

## 2016-06-27 LAB — URINE MICROSCOPIC-ADD ON

## 2016-06-27 MED ORDER — CEFTRIAXONE SODIUM 1 G IJ SOLR
1.0000 g | Freq: Once | INTRAMUSCULAR | Status: AC
  Administered 2016-06-27: 1 g via INTRAMUSCULAR
  Filled 2016-06-27: qty 10

## 2016-06-27 MED ORDER — CEPHALEXIN 500 MG PO CAPS: 500.0000 mg | ORAL_CAPSULE | Freq: Three times a day (TID) | ORAL | 0 refills | Status: DC

## 2016-06-27 MED ORDER — PHENAZOPYRIDINE HCL 100 MG PO TABS
95.0000 mg | ORAL_TABLET | Freq: Once | ORAL | Status: AC
  Administered 2016-06-27: 100 mg via ORAL
  Filled 2016-06-27: qty 1

## 2016-06-27 MED ORDER — PHENAZOPYRIDINE HCL 100 MG PO TABS: 100.0000 mg | ORAL_TABLET | Freq: Three times a day (TID) | ORAL | 0 refills | Status: DC | PRN

## 2016-06-27 NOTE — ED Provider Notes (Signed)
[facility] Provider Note   CSN: [id number] Arrival date & time: 06/27/16  0358     History   Chief Complaint Chief Complaint  Patient presents with  . Hematuria    HPI Tricia Newman is a 75 y.o. female.  HPI  This is a 75 year old female who presents with dysuria and hematuria. Patient states that she felt well until yesterday evening. At that time she began to have urinary frequency, dysuria and noted hematuria. She states that she has been up all night. No history of UTIs. Denies fever or flank pain. Reports passage of blood and clot with urination. Reports sharp, intermittent lower abdominal pain. Currently she is without pain but states that she has not slept all night and is very uncomfortable when urinating.  Past Medical History:  Diagnosis Date  . Allergic rhinitis   . Hypertension   . Migraine headache   . Osteoporosis   . PMS (premenstrual syndrome)   . Rosacea     Patient Active Problem List   Diagnosis Date Noted  . Back pain, chronic 08/02/2012  . SEBACEOUS GLAND DISORDER 07/08/2010  . Essential hypertension 07/21/2008  . ACNE ROSACEA 06/24/2007  . ALLERGIC RHINITIS 05/26/2007  . OSTEOPOROSIS 05/26/2007    Past Surgical History:  Procedure Laterality Date  . childbirthx2    . COLONOSCOPY  06/2003    OB History    No data available       Home Medications    Prior to Admission medications   Medication Sig Start Date End Date Taking? Authorizing Provider  Calcium Carbonate-Vitamin D (CALCIUM 600 + D PO) Take by mouth daily.      Historical Provider, MD  cephALEXin (KEFLEX) 500 MG capsule Take 1 capsule (500 mg total) by mouth 3 (three) times daily. 06/27/16   Merryl Hacker, MD  fish oil-omega-3 fatty acids 1000 MG capsule Take 2 g by mouth 2 (two) times daily.      Historical Provider, MD  glucosamine-chondroitin 500-400 MG tablet Take 1 tablet by mouth daily.      Historical Provider, MD  lisinopril (PRINIVIL,ZESTRIL) 10 MG  tablet Take 1 tablet (10 mg total) by mouth every morning. 09/11/15   Dorena Cookey, MD  loratadine (CLARITIN) 10 MG tablet Take 10 mg by mouth daily.      Historical Provider, MD  Multiple Vitamins-Minerals (CENTRUM SILVER PO) Take by mouth daily.      Historical Provider, MD  phenazopyridine (PYRIDIUM) 100 MG tablet Take 1 tablet (100 mg total) by mouth 3 (three) times daily as needed for pain. 06/27/16   Merryl Hacker, MD  tiZANidine (ZANAFLEX) 4 MG tablet Take 1 tablet (4 mg total) by mouth at bedtime as needed. 09/11/15   Dorena Cookey, MD  traMADol (ULTRAM) 50 MG tablet TAKE 1 TABLET BY MOUTH AT BEDTIME AS NEEDED FOR PAIN 09/11/15   Dorena Cookey, MD    Family History Family History  Problem Relation Age of Onset  . Colon polyps Sister   . Colon polyps Brother   . Colon cancer Neg Hx     Social History Social History  Substance Use Topics  . Smoking status: Former Smoker    Quit date: 10/20/1976  . Smokeless tobacco: Never Used  . Alcohol use No     Allergies   Review of patient's allergies indicates no known allergies.   Review of Systems Review of Systems  Constitutional: Negative for fever.  Respiratory: Negative for shortness of breath.  Cardiovascular: Negative for chest pain.  Gastrointestinal: Positive for abdominal pain. Negative for nausea and vomiting.  Genitourinary: Positive for dysuria, frequency, hematuria and urgency. Negative for flank pain.  All other systems reviewed and are negative.    Physical Exam Updated Vital Signs BP 142/75 (BP Location: Right Arm)   Pulse 100   Temp 98.1 F (36.7 C) (Oral)   Resp 18   Ht 5\' 2"  (1.575 m)   Wt 150 lb (68 kg)   SpO2 100%   BMI 27.44 kg/m   Physical Exam  Constitutional: She is oriented to person, place, and time. She appears well-developed and well-nourished. No distress.  HENT:  Head: Normocephalic and atraumatic.  Cardiovascular: Normal rate, regular rhythm and normal heart sounds.   No  murmur heard. Pulmonary/Chest: Effort normal and breath sounds normal. No respiratory distress. She has no wheezes.  Abdominal: Soft. Bowel sounds are normal. There is no tenderness. There is no guarding.  Neurological: She is alert and oriented to person, place, and time.  Skin: Skin is warm and dry.  Psychiatric: She has a normal mood and affect.  Nursing note and vitals reviewed.    ED Treatments / Results  Labs (all labs ordered are listed, but only abnormal results are displayed) Labs Reviewed  URINALYSIS, ROUTINE W REFLEX MICROSCOPIC (NOT AT [facility]) - Abnormal; Notable for the following:       Result Value   Color, Urine RED (*)    APPearance TURBID (*)    Hgb urine dipstick LARGE (*)    Ketones, ur 15 (*)    Protein, ur 100 (*)    Leukocytes, UA LARGE (*)    All other components within normal limits  URINE MICROSCOPIC-ADD ON - Abnormal; Notable for the following:    Squamous Epithelial / LPF 0-5 (*)    Bacteria, UA FEW (*)    All other components within normal limits  URINE CULTURE    EKG  EKG Interpretation None       Radiology No results found.  Procedures Procedures (including critical care time)  Medications Ordered in ED Medications  phenazopyridine (PYRIDIUM) tablet 100 mg (100 mg Oral Given 06/27/16 0446)  cefTRIAXone (ROCEPHIN) injection 1 g (1 g Intramuscular Given 06/27/16 0447)     Initial Impression / Assessment and Plan / ED Course  I have reviewed the triage vital signs and the nursing notes.  Pertinent labs & imaging results that were available during my care of the patient were reviewed by me and considered in my medical decision making (see chart for details).  Clinical Course    Patient presents with hematuria, dysuria, frequency. She is nontoxic. Afebrile. Vital signs are reassuring. She is clinically well appearing. No significant abdominal tenderness. Urinalysis obtained and shows too numerous to count white cells and red cells. Feel  this is consistent with a urinary tract infection. Patient was given Pyridium and IM Rocephin. No signs or symptoms at this time of pyelonephritis or systemic infection. Will discharge with Keflex and Pyridium. Follow-up with PCP.  After history, exam, and medical workup I feel the patient has been appropriately medically screened and is safe for discharge home. Pertinent diagnoses were discussed with the patient. Patient was given return precautions.   Final Clinical Impressions(s) / ED Diagnoses   Final diagnoses:  UTI (lower urinary tract infection)    New Prescriptions Discharge Medication List as of 06/27/2016  5:22 AM    START taking these medications   Details  cephALEXin (KEFLEX) 500  MG capsule Take 1 capsule (500 mg total) by mouth 3 (three) times daily., Starting Fri 06/27/2016, Print    phenazopyridine (PYRIDIUM) 100 MG tablet Take 1 tablet (100 mg total) by mouth 3 (three) times daily as needed for pain., Starting Fri 06/27/2016, Print         Merryl Hacker, MD 06/27/16 [phone]

## 2016-06-27 NOTE — ED Triage Notes (Signed)
Pt c/o vaginal bleeding that started about 10 hours ago. Pt reports bright red blood with clots.

## 2016-06-27 NOTE — ED Triage Notes (Signed)
Pt states she has burning with urination and only has bleeding while urinating.

## 2016-06-28 LAB — URINE CULTURE

## 2016-07-22 ENCOUNTER — Ambulatory Visit (INDEPENDENT_AMBULATORY_CARE_PROVIDER_SITE_OTHER): Payer: Medicare Other

## 2016-07-22 DIAGNOSIS — Z23 Encounter for immunization: Secondary | ICD-10-CM | POA: Diagnosis not present

## 2016-09-16 ENCOUNTER — Encounter: Payer: Medicare Other | Admitting: Family Medicine

## 2016-09-17 ENCOUNTER — Other Ambulatory Visit: Payer: Self-pay | Admitting: Family Medicine

## 2016-09-22 ENCOUNTER — Other Ambulatory Visit (HOSPITAL_COMMUNITY)
Admission: RE | Admit: 2016-09-22 | Discharge: 2016-09-22 | Disposition: A | Discharge status: Home / Self Care | Payer: Medicare Other | Source: Ambulatory Visit | Attending: Family Medicine | Admitting: Family Medicine

## 2016-09-22 ENCOUNTER — Encounter: Payer: Self-pay | Admitting: Family Medicine

## 2016-09-22 ENCOUNTER — Ambulatory Visit (INDEPENDENT_AMBULATORY_CARE_PROVIDER_SITE_OTHER): Payer: Medicare Other | Admitting: Family Medicine

## 2016-09-22 ENCOUNTER — Ambulatory Visit (INDEPENDENT_AMBULATORY_CARE_PROVIDER_SITE_OTHER)
Admission: RE | Admit: 2016-09-22 | Discharge: 2016-09-22 | Disposition: A | Discharge status: Home / Self Care | Payer: Medicare Other | Source: Ambulatory Visit | Attending: Family Medicine | Admitting: Family Medicine

## 2016-09-22 DIAGNOSIS — I10 Essential (primary) hypertension: Secondary | ICD-10-CM | POA: Diagnosis not present

## 2016-09-22 DIAGNOSIS — Z23 Encounter for immunization: Secondary | ICD-10-CM | POA: Diagnosis not present

## 2016-09-22 DIAGNOSIS — G8929 Other chronic pain: Secondary | ICD-10-CM

## 2016-09-22 DIAGNOSIS — Z124 Encounter for screening for malignant neoplasm of cervix: Secondary | ICD-10-CM | POA: Insufficient documentation

## 2016-09-22 DIAGNOSIS — M545 Low back pain, unspecified: Secondary | ICD-10-CM

## 2016-09-22 DIAGNOSIS — S61411A Laceration without foreign body of right hand, initial encounter: Secondary | ICD-10-CM | POA: Insufficient documentation

## 2016-09-22 DIAGNOSIS — S61401A Unspecified open wound of right hand, initial encounter: Secondary | ICD-10-CM | POA: Diagnosis not present

## 2016-09-22 LAB — CBC WITH DIFFERENTIAL/PLATELET
BASOS PCT: 0.4 % (ref 0.0–3.0)
Basophils Absolute: 0 10*3/uL (ref 0.0–0.1)
EOS ABS: 0.1 10*3/uL (ref 0.0–0.7)
EOS PCT: 2.1 % (ref 0.0–5.0)
HCT: 41 % (ref 36.0–46.0)
Hemoglobin: 13.7 g/dL (ref 12.0–15.0)
LYMPHS ABS: 1.8 10*3/uL (ref 0.7–4.0)
Lymphocytes Relative: 36.9 % (ref 12.0–46.0)
MCHC: 33.3 g/dL (ref 30.0–36.0)
MCV: 86.1 fl (ref 78.0–100.0)
MONO ABS: 0.3 10*3/uL (ref 0.1–1.0)
Monocytes Relative: 6 % (ref 3.0–12.0)
NEUTROS PCT: 54.6 % (ref 43.0–77.0)
Neutro Abs: 2.7 10*3/uL (ref 1.4–7.7)
Platelets: 185 10*3/uL (ref 150.0–400.0)
RBC: 4.76 Mil/uL (ref 3.87–5.11)
RDW: 14.6 % (ref 11.5–15.5)
WBC: 4.9 10*3/uL (ref 4.0–10.5)

## 2016-09-22 LAB — HEPATIC FUNCTION PANEL
ALT: 16 U/L (ref 0–35)
AST: 23 U/L (ref 0–37)
Albumin: 4.3 g/dL (ref 3.5–5.2)
Alkaline Phosphatase: 64 U/L (ref 39–117)
BILIRUBIN DIRECT: 0.1 mg/dL (ref 0.0–0.3)
BILIRUBIN TOTAL: 0.5 mg/dL (ref 0.2–1.2)
Total Protein: 7.5 g/dL (ref 6.0–8.3)

## 2016-09-22 LAB — BASIC METABOLIC PANEL
BUN: 17 mg/dL (ref 6–23)
CO2: 28 mEq/L (ref 19–32)
CREATININE: 0.65 mg/dL (ref 0.40–1.20)
Calcium: 9.5 mg/dL (ref 8.4–10.5)
Chloride: 104 mEq/L (ref 96–112)
GFR: 94.01 mL/min (ref >60.00)
GLUCOSE: 91 mg/dL (ref 70–99)
POTASSIUM: 4 meq/L (ref 3.5–5.1)
Sodium: 140 mEq/L (ref 135–145)

## 2016-09-22 LAB — POC URINALSYSI DIPSTICK (AUTOMATED)
BILIRUBIN UA: NEGATIVE
Blood, UA: NEGATIVE
GLUCOSE UA: NEGATIVE
KETONES UA: NEGATIVE
LEUKOCYTES UA: NEGATIVE
Nitrite, UA: NEGATIVE
Protein, UA: NEGATIVE
Spec Grav, UA: 1.015
Urobilinogen, UA: 0.2
pH, UA: 6.5

## 2016-09-22 LAB — LIPID PANEL
CHOL/HDL RATIO: 2
CHOLESTEROL: 214 mg/dL — AB (ref 0–200)
HDL: 90.4 mg/dL (ref >39.00)
LDL CALC: 112 mg/dL — AB (ref 0–99)
NonHDL: 123.16
TRIGLYCERIDES: 58 mg/dL (ref 0.0–149.0)
VLDL: 11.6 mg/dL (ref 0.0–40.0)

## 2016-09-22 LAB — TSH: TSH: 2.58 u[IU]/mL (ref 0.35–4.50)

## 2016-09-22 MED ORDER — TIZANIDINE HCL 4 MG PO TABS: 4.0000 mg | ORAL_TABLET | Freq: Every evening | ORAL | 3 refills | Status: DC | PRN

## 2016-09-22 MED ORDER — TRAMADOL HCL 50 MG PO TABS: ORAL_TABLET | ORAL | 3 refills | Status: DC

## 2016-09-22 MED ORDER — BENAZEPRIL HCL 10 MG PO TABS: 10.0000 mg | ORAL_TABLET | Freq: Every day | ORAL | 4 refills | Status: DC

## 2016-09-22 NOTE — Progress Notes (Signed)
Tricia Newman is a 75 year old single female nonsmoker who comes in today for general evaluation because of a history of hypertension and low back pain  Her hypertension takes lisinopril 10 mg daily. BP at home 135/85  She was recently given Keflex for urinary tract infection urgent care center  She takes Zanaflex 4 mg along with tramadol 50 mg at bedtime for severe back pain. This occurs about once a month or twice a month.  She gets routine eye care, dental care, BSE monthly, mammogram 2016, colonoscopy 2014 showed a polyp. She was told to come back in 5 years. Her LMP was in her mid 41s L last Pap was 2011. She's never had trouble with her Pap smears in the past  She's had chronic low back pain this started when she was a teenager. Now the pain associated when she walks her back hurts. She did says her legs don't get numb. She's had x-rays in the past. She does not recall what they showed.  She cut her hand about a month ago. She's to a tetanus booster.  Vaccinations otherwise up-to-date  14 point review of systems reviewed and otherwise negative  Physical evaluation.vs .BP 135/85 (BP Location: Left Arm, Patient Position: Sitting, Cuff Size: Normal)   Pulse 74   Temp 98 F (36.7 C) (Oral)   Ht 5' 1.5" (1.562 m)   Wt 153 lb (69.4 kg)   SpO2 98%   BMI 28.44 kg/m  Examination of the HEENT is negative except for early bilateral cataracts. Left is more dense in the right however she's not having difficulty seeing neither driving. Neck was supple no adenopathy thyroid normal no carotid bruits cardiopulmonary exam normal abdominal exam normal breast exam normal pelvic examination external genitalia within normal limits vaginal vault was normal bimanual exam negative Pap smear is done rectum normal stool guaiac negative extremities normal skin normal peripheral pulses normal  Impression #1 hypertension at goal........ continue current therapy  #2 chronic low back pain......... continue exercise  tramadol and Zanaflex episodically...Marland KitchenMarland KitchenMarland Kitchen once or twice a month at bedtime....... offered neurosurgical consult for further evaluation she declines which I think is reasonable.  #3 allergic rhinitis continue OTC Claritin

## 2016-09-22 NOTE — Patient Instructions (Signed)
Because of the cut in your right hand will give you a tetanus booster  Continue current medications  Go to the main office when you leave here to get x-rays of your spine. Return in one year for general physical exam sooner if any problems  Labs today  Zanaflex and tramadol........ one half to one of each at bedtime for severe back pain as needed

## 2016-09-22 NOTE — Progress Notes (Signed)
Pre visit review using our clinic review tool, if applicable. No additional management support is needed unless otherwise documented below in the visit note. 

## 2016-09-23 LAB — CYTOLOGY - PAP: Diagnosis: NEGATIVE

## 2017-01-06 ENCOUNTER — Other Ambulatory Visit: Payer: Self-pay | Admitting: Family Medicine

## 2017-01-06 DIAGNOSIS — Z1231 Encounter for screening mammogram for malignant neoplasm of breast: Secondary | ICD-10-CM

## 2017-01-30 ENCOUNTER — Ambulatory Visit
Admission: RE | Admit: 2017-01-30 | Discharge: 2017-01-30 | Disposition: A | Discharge status: Home / Self Care | Payer: Medicare Other | Source: Ambulatory Visit | Attending: Family Medicine | Admitting: Family Medicine

## 2017-01-30 DIAGNOSIS — Z1231 Encounter for screening mammogram for malignant neoplasm of breast: Secondary | ICD-10-CM

## 2017-01-30 DIAGNOSIS — H04123 Dry eye syndrome of bilateral lacrimal glands: Secondary | ICD-10-CM | POA: Diagnosis not present

## 2017-01-30 DIAGNOSIS — H02839 Dermatochalasis of unspecified eye, unspecified eyelid: Secondary | ICD-10-CM | POA: Diagnosis not present

## 2017-01-30 DIAGNOSIS — H2513 Age-related nuclear cataract, bilateral: Secondary | ICD-10-CM | POA: Diagnosis not present

## 2017-01-30 DIAGNOSIS — H18413 Arcus senilis, bilateral: Secondary | ICD-10-CM | POA: Diagnosis not present

## 2017-08-03 ENCOUNTER — Ambulatory Visit (INDEPENDENT_AMBULATORY_CARE_PROVIDER_SITE_OTHER): Payer: Medicare Other | Admitting: *Deleted

## 2017-08-03 DIAGNOSIS — Z23 Encounter for immunization: Secondary | ICD-10-CM | POA: Diagnosis not present

## 2017-09-16 ENCOUNTER — Other Ambulatory Visit: Payer: Self-pay | Admitting: Family Medicine

## 2017-10-06 ENCOUNTER — Ambulatory Visit (INDEPENDENT_AMBULATORY_CARE_PROVIDER_SITE_OTHER): Payer: Medicare Other | Admitting: Family Medicine

## 2017-10-06 ENCOUNTER — Encounter: Payer: Self-pay | Admitting: Family Medicine

## 2017-10-06 VITALS — BP 140/80 | HR 68 | Temp 98.0°F | Ht 61.0 in | Wt 149.0 lb

## 2017-10-06 DIAGNOSIS — M545 Low back pain, unspecified: Secondary | ICD-10-CM

## 2017-10-06 DIAGNOSIS — I10 Essential (primary) hypertension: Secondary | ICD-10-CM | POA: Diagnosis not present

## 2017-10-06 DIAGNOSIS — G8929 Other chronic pain: Secondary | ICD-10-CM

## 2017-10-06 DIAGNOSIS — J309 Allergic rhinitis, unspecified: Secondary | ICD-10-CM | POA: Diagnosis not present

## 2017-10-06 LAB — BASIC METABOLIC PANEL
BUN: 20 mg/dL (ref 6–23)
CALCIUM: 9.3 mg/dL (ref 8.4–10.5)
CO2: 30 mEq/L (ref 19–32)
Chloride: 102 mEq/L (ref 96–112)
Creatinine, Ser: 0.64 mg/dL (ref 0.40–1.20)
GFR: 95.45 mL/min (ref >60.00)
Glucose, Bld: 86 mg/dL (ref 70–99)
Potassium: 4.7 mEq/L (ref 3.5–5.1)
SODIUM: 139 meq/L (ref 135–145)

## 2017-10-06 LAB — CBC WITH DIFFERENTIAL/PLATELET
BASOS ABS: 0 10*3/uL (ref 0.0–0.1)
Basophils Relative: 0.7 % (ref 0.0–3.0)
Eosinophils Absolute: 0.1 10*3/uL (ref 0.0–0.7)
Eosinophils Relative: 2.2 % (ref 0.0–5.0)
HCT: 41.7 % (ref 36.0–46.0)
HEMOGLOBIN: 13.7 g/dL (ref 12.0–15.0)
LYMPHS ABS: 1.8 10*3/uL (ref 0.7–4.0)
Lymphocytes Relative: 41.8 % (ref 12.0–46.0)
MCHC: 32.9 g/dL (ref 30.0–36.0)
MCV: 87.7 fl (ref 78.0–100.0)
Monocytes Absolute: 0.3 10*3/uL (ref 0.1–1.0)
Monocytes Relative: 6.6 % (ref 3.0–12.0)
NEUTROS PCT: 48.7 % (ref 43.0–77.0)
Neutro Abs: 2.1 10*3/uL (ref 1.4–7.7)
Platelets: 179 10*3/uL (ref 150.0–400.0)
RBC: 4.76 Mil/uL (ref 3.87–5.11)
RDW: 14.5 % (ref 11.5–15.5)
WBC: 4.3 10*3/uL (ref 4.0–10.5)

## 2017-10-06 LAB — POCT URINALYSIS DIPSTICK
Bilirubin, UA: NEGATIVE
Glucose, UA: NEGATIVE
Ketones, UA: NEGATIVE
LEUKOCYTES UA: NEGATIVE
NITRITE UA: NEGATIVE
PH UA: 7.5 (ref 5.0–8.0)
Protein, UA: NEGATIVE
RBC UA: NEGATIVE
Spec Grav, UA: 1.015 (ref 1.010–1.025)
Urobilinogen, UA: 0.2 E.U./dL

## 2017-10-06 LAB — TSH: TSH: 4.47 u[IU]/mL (ref 0.35–4.50)

## 2017-10-06 MED ORDER — TIZANIDINE HCL 4 MG PO TABS: 4.0000 mg | ORAL_TABLET | Freq: Every evening | ORAL | 3 refills | Status: DC | PRN

## 2017-10-06 MED ORDER — TRAMADOL HCL 50 MG PO TABS: ORAL_TABLET | ORAL | 3 refills | Status: DC

## 2017-10-06 NOTE — Progress Notes (Signed)
Tricia Newman is a 76 year old single female nonsmoker comes in today for evaluation of hypertension, allergic rhinitis, and back pain  For hypertension takes Lotensin 10 mg daily. BP 140/80  She takes Claritin for allergic rhinitis but doesn't seem to be helping. Advised to switch to plain Zyrtec  She keeps Zanaflex and tramadol at home when necessary for back pain. She has a history of a degenerative disc. Once or twice years Apolonio Schneiders a flare. She take her medication go to bed rest for a day or 2 and it resolves  Vaccination history up-to-date information given on the new shingles vaccine  She gets routine eye care.......Marland Kitchen bilateral cataracts due for extraction..........Marland Kitchen regular dental care, BSE [place] monthly, annual mammography, colonoscopy 2014 showed one polyp. She's due to go back next year for follow-up colonoscopy. She is asymptomatic  She's never had any GYN problems last Pap smear December 2017 was normal.  Cognitive function normal she walks daily home health safety reviewed no issues identified, no guns in the house, she does have a healthcare power of attorney and living well.  14 point review of systems reviewed and otherwise negative  EKG was done because a history of hypertension. EKG was normal and unchanged  BP 140/80 (BP Location: Left Arm, Patient Position: Sitting, Cuff Size: Normal)   Pulse 68   Temp 98 F (36.7 C) (Oral)   Ht 5\' 1"  (1.549 m)   Wt 149 lb (67.6 kg)   BMI 28.15 kg/m  General she is well-developed well-nourished female no acute distress examination HEENT were negative except for fairly dense bilateral cataracts. Neck was supple thyroid not enlarged no carotid bruits cardiopulmonary exam normal abdominal exam normal breast exam normal pelvic and rectal not indicated extremities normal skin no peripheral pulses normal  #1 hypertension ago.... Continue current medication check labs  #2 allergic rhinitis.......Marland Kitchen plain Zyrtec daily at bedtime  #3 degenerative  disc disease..... Refill meds.

## 2017-10-06 NOTE — Patient Instructions (Signed)
Continue good diet and exercise habits  Labs today.............. I will call a physician thing abnormal  Follow-up in one year sooner if any problems  Call your insurance company about the shingles vaccine.

## 2017-12-22 ENCOUNTER — Other Ambulatory Visit: Payer: Self-pay

## 2017-12-22 ENCOUNTER — Emergency Department (HOSPITAL_BASED_OUTPATIENT_CLINIC_OR_DEPARTMENT_OTHER)
Admission: EM | Admit: 2017-12-22 | Discharge: 2017-12-22 | Disposition: A | Discharge status: Home / Self Care | Payer: Medicare Other | Attending: Emergency Medicine | Admitting: Emergency Medicine

## 2017-12-22 ENCOUNTER — Encounter (HOSPITAL_BASED_OUTPATIENT_CLINIC_OR_DEPARTMENT_OTHER): Payer: Self-pay

## 2017-12-22 DIAGNOSIS — J111 Influenza due to unidentified influenza virus with other respiratory manifestations: Secondary | ICD-10-CM | POA: Diagnosis not present

## 2017-12-22 DIAGNOSIS — I10 Essential (primary) hypertension: Secondary | ICD-10-CM | POA: Insufficient documentation

## 2017-12-22 DIAGNOSIS — Z87891 Personal history of nicotine dependence: Secondary | ICD-10-CM | POA: Insufficient documentation

## 2017-12-22 DIAGNOSIS — R69 Illness, unspecified: Secondary | ICD-10-CM

## 2017-12-22 DIAGNOSIS — Z79899 Other long term (current) drug therapy: Secondary | ICD-10-CM | POA: Diagnosis not present

## 2017-12-22 DIAGNOSIS — R05 Cough: Secondary | ICD-10-CM | POA: Diagnosis present

## 2017-12-22 MED ORDER — ACETAMINOPHEN 325 MG PO TABS
650.0000 mg | ORAL_TABLET | Freq: Once | ORAL | Status: AC
  Administered 2017-12-22: 650 mg via ORAL
  Filled 2017-12-22: qty 2

## 2017-12-22 MED ORDER — OSELTAMIVIR PHOSPHATE 75 MG PO CAPS: 75.0000 mg | ORAL_CAPSULE | Freq: Two times a day (BID) | ORAL | 0 refills | Status: DC

## 2017-12-22 MED ORDER — HYDROCODONE-ACETAMINOPHEN 5-325 MG PO TABS: 1.0000 | ORAL_TABLET | Freq: Four times a day (QID) | ORAL | 0 refills | Status: AC | PRN

## 2017-12-22 NOTE — ED Triage Notes (Signed)
C/o flu like sx day 3-NAD-steady gait 

## 2017-12-22 NOTE — ED Provider Notes (Signed)
[facility] EMERGENCY DEPARTMENT Provider Note   CSN: [id number] Arrival date & time: 12/22/17  1539     History   Chief Complaint Chief Complaint  Patient presents with  . Cough    HPI Tricia Newman is a 77 y.o. female with history of hypertension and is here for evaluation of subjective fevers, chills anddiffuse body aches for 2-3 days. Has hada dry, nonproductive cough. Has been takenSudafed and Tylenol for symptoms with mild relief. States that the body aches are really severe and has been having to stay in bed due to the pain. Denies sick contacts. She denies chest pain, shortness of breath, nausea, vomiting, abdominal pain, changes in bowel movements, urinary symptoms. No history of immunosuppression. History of lung disease. Remote history of tobacco abuse 30 years ago.  HPI  Past Medical History:  Diagnosis Date  . Allergic rhinitis   . Hypertension   . Migraine headache   . Osteoporosis   . PMS (premenstrual syndrome)   . Rosacea     Patient Active Problem List   Diagnosis Date Noted  . Back pain, chronic 08/02/2012  . SEBACEOUS GLAND DISORDER 07/08/2010  . Essential hypertension 07/21/2008  . ACNE ROSACEA 06/24/2007  . Allergic rhinitis 05/26/2007  . OSTEOPOROSIS 05/26/2007    Past Surgical History:  Procedure Laterality Date  . childbirthx2    . COLONOSCOPY  06/2003    OB History    No data available       Home Medications    Prior to Admission medications   Medication Sig Start Date End Date Taking? Authorizing Provider  benazepril (LOTENSIN) 10 MG tablet TAKE 1 TABLET EVERY DAY 09/17/17   Dorena Cookey, MD  Calcium Carbonate-Vitamin D (CALCIUM 600 + D PO) Take by mouth daily.      [provider]  fish oil-omega-3 fatty acids 1000 MG capsule Take 2 g by mouth 2 (two) times daily.      [provider]  glucosamine-chondroitin 500-400 MG tablet Take 1 tablet by mouth daily.      [provider]    HYDROcodone-acetaminophen (NORCO/VICODIN) 5-325 MG tablet Take 1 tablet by mouth every 6 (six) hours as needed for up to 2 days. 12/22/17 12/24/17  Kinnie Feil, PA-C  loratadine (CLARITIN) 10 MG tablet Take 10 mg by mouth daily.      [provider]  Multiple Vitamins-Minerals (CENTRUM SILVER PO) Take by mouth daily.      [provider]  oseltamivir (TAMIFLU) 75 MG capsule Take 1 capsule (75 mg total) by mouth every 12 (twelve) hours. 12/22/17   Kinnie Feil, PA-C    Family History Family History  Problem Relation Age of Onset  . Colon polyps Sister   . Colon polyps Brother   . Breast cancer Maternal Aunt   . Colon cancer Neg Hx     Social History Social History   Tobacco Use  . Smoking status: Former Smoker    Last attempt to quit: 10/20/1976    Years since quitting: 41.2  . Smokeless tobacco: Never Used  Substance Use Topics  . Alcohol use: No  . Drug use: No     Allergies   Patient has no known allergies.   Review of Systems Review of Systems  Constitutional: Positive for chills and fever.  Respiratory: Positive for cough.   Musculoskeletal: Positive for myalgias.  All other systems reviewed and are negative.    Physical Exam Updated Vital Signs BP (!) 160/81 (  BP Location: Right Arm)   Pulse 90   Temp 98.6 F (37 C) (Oral)   Resp 18   Ht 5\' 1"  (1.549 m)   Wt 68.9 kg (151 lb 14.4 oz)   SpO2 98%   BMI 28.70 kg/m   Physical Exam  Constitutional: She is oriented to person, place, and time. She appears well-developed and well-nourished. No distress.  Non toxic  HENT:  Head: Normocephalic and atraumatic.  Nose: Nose normal.  Mouth/Throat: Oropharynx is clear and moist. No oropharyngeal exudate.  Moist mucous membranes   Eyes: Conjunctivae and EOM are normal. Pupils are equal, round, and reactive to light.  Neck: Normal range of motion. Neck supple.  Cardiovascular: Normal rate, regular rhythm, normal heart sounds and intact  distal pulses.  No murmur heard. 2+ DP and radial pulses bilaterally. No LE edema.   Pulmonary/Chest: Effort normal and breath sounds normal. No respiratory distress. She has no wheezes. She has no rales. She exhibits no tenderness.  Abdominal: Soft. Bowel sounds are normal. She exhibits no distension. There is no tenderness.  No G/R/R. No suprapubic or CVA tenderness.   Musculoskeletal: Normal range of motion. She exhibits no deformity.  Lymphadenopathy:    She has no cervical adenopathy.  Neurological: She is alert and oriented to person, place, and time. No sensory deficit.  Skin: Skin is warm and dry. Capillary refill takes less than 2 seconds.  Psychiatric: She has a normal mood and affect. Her behavior is normal. Judgment and thought content normal.  Nursing note and vitals reviewed.    ED Treatments / Results  Labs (all labs ordered are listed, but only abnormal results are displayed) Labs Reviewed - No data to display  EKG  EKG Interpretation None       Radiology No results found.  Procedures Procedures (including critical care time)  Medications Ordered in ED Medications  acetaminophen (TYLENOL) tablet 650 mg (650 mg Oral Given 12/22/17 1606)     Initial Impression / Assessment and Plan / ED Course  I have reviewed the triage vital signs and the nursing notes.  Pertinent labs & imaging results that were available during my care of the patient were reviewed by me and considered in my medical decision making (see chart for details).    77 y.o. -year-old female here with URI like symptoms  2-3 days. On my exam patient is nontoxic appearing, speaking in full sentences, w/o increased WOB. No fever, tachypnea, tachycardia, hypoxia. Lungs are CTAB. I do not think that a CXR is indicated at this time as VS are WNL, there are no signs of consolidation on auscultation and there is no hypoxia. No significant h/o immunocompromise. Doubt pneumonia.  Given reassuring physical  exam, will discharge with symptomatic treatment for ILI.  Given age and onset of symptoms will dc with tamiflu. Strict ED return precautions given. Patient is aware that a viral URI infection may precede pneumonia or worsening illness. Patient is aware of red flag symptoms to monitor for that would warrant return to the ED for further reevaluation. I encouraged her to fu with PCP via phone call in 2-3 days to ensure symptoms are improving. Pt shared with SP.   Final Clinical Impressions(s) / ED Diagnoses   Final diagnoses:  Influenza-like illness    ED Discharge Orders        Ordered    HYDROcodone-acetaminophen (NORCO/VICODIN) 5-325 MG tablet  Every 6 hours PRN     12/22/17 1652    oseltamivir (TAMIFLU) 75  MG capsule  Every 12 hours     12/22/17 1652       Arlean Hopping 12/22/17 1654    Charlesetta Shanks, MD 12/25/17 [phone]

## 2017-12-22 NOTE — ED Notes (Signed)
Pt c/o flu-like symptoms since Sunday, nausea, and body aches.  Last had tylenol this morning when she woke up.

## 2017-12-22 NOTE — Discharge Instructions (Signed)
Your symptoms are most consistent with a viral upper respiratory infection, possibly influenza.  The main treatment approach for a viral upper respiratory infection and/or influenza is to treat the symptoms, support your immune system and prevent spread of illness. Stay well-hydrated. Rest. Take ibuprofen or Tylenol around the clock to help with associated fevers, sore throat, headaches, generalized body aches and malaise. Use an over-the-counter nasal steroid spray (like Flonase) to help with nasal congestion, runny nose and postnasal drip.  Wash your hands often to prevent spread.  Given severity of body aches, you have short prescription for vicodin. Take this as prescribed.   A viral upper respiratory infection and/or influenza typically lasts 5-7 days.  Symptoms resolve slowly and you may have a lingering dry cough for up to 6-8 weeks.  However, a viral upper respiratory infection can also worsen and progress into pneumonia.  Monitor your symptoms. If your symptoms worsen, persist and you develop persistent fevers, chest pain, shortness of breath return to the emergency department. Call your primary care doctor for a phone check in in the next 2-3 days.   Vicodin is a narcotic pain medication that has risk of overdose, death, dependence and abuse. Mild and expected side effects include nausea, stomach upset, drowsiness, constipation. Do not consume alcohol, drive or use heavy machinery while taking this medication. Do not leave unattended around children. Flush any remaining pills that you do not use and do not share.  The emergency department has a strict policy regarding prescription of narcotic medications. We prescribe a short course for acute, new pain or injuries. We are unable to refill this medication in the emergency department for chronic pain or repeatedly.  Refill need to be done by specialist or primary care provider or pain clinic.  Contact your primary care provider or specialist for  chronic pain management and refill on narcotic medications.

## 2017-12-22 NOTE — ED Provider Notes (Signed)
Medical screening examination/treatment/procedure(s) were conducted as a shared visit with non-physician practitioner(s) and myself.  I personally evaluated the patient during the encounter.   EKG Interpretation None     Patient reports she developed fever, chills and severe body aches Sunday afternoon.  She reports she has had to stay mostly in bed because of her symptoms.  She has had cough but does not have shortness of breath or chest pain.  No vomiting or abdominal pain.  No urinary symptoms.  Positive upper respiratory symptoms of nasal discharge and mild sore throat.  She is alert and nontoxic.  She is sitting up in a chair.  No respiratory distress.  Mental status clear.  Lungs are clear without wheeze rhonchi or rale.  Skin is warm and dry.  All movements are coordinated purposeful symmetric.  Findings are consistent with influenza-like illness.  Patient is with an window of treatment.  Agree with initiating Tamiflu and as needed Vicodin and ibuprofen for symptomatic treatment.   Charlesetta Shanks, MD 12/22/17 [phone]

## 2017-12-22 NOTE — ED Notes (Signed)
Pt verbalizes understanding of d/c instructions and denies any further needs at this time. 

## 2017-12-28 ENCOUNTER — Other Ambulatory Visit: Payer: Self-pay | Admitting: Family Medicine

## 2017-12-28 DIAGNOSIS — Z1231 Encounter for screening mammogram for malignant neoplasm of breast: Secondary | ICD-10-CM

## 2018-01-13 ENCOUNTER — Telehealth: Payer: Self-pay | Admitting: Family Medicine

## 2018-01-13 NOTE — Telephone Encounter (Signed)
Looks like patient has Medicare part A&B.

## 2018-01-13 NOTE — Telephone Encounter (Signed)
I spoke with pt and advised to check at different pharmacies to see if they have the vaccine, pt's insurance will not cover this injection given here in office, pt agreed.

## 2018-01-13 NOTE — Telephone Encounter (Signed)
Copied from [place] [id number]. Topic: Quick Communication - See Telephone Encounter >> Jan 13, 2018 10:15 AM Ahmed Prima L wrote: CRM for notification. See Telephone encounter for: 01/13/18.  Patient said that she needs the shingle shot. Advised patient that it is a shortage on them and wants a nurse to call her back so she can be on the list to get it when it is available Call back is [phone]

## 2018-02-02 ENCOUNTER — Ambulatory Visit
Admission: RE | Admit: 2018-02-02 | Discharge: 2018-02-02 | Disposition: A | Discharge status: Home / Self Care | Payer: Medicare Other | Source: Ambulatory Visit | Attending: Family Medicine | Admitting: Family Medicine

## 2018-02-02 DIAGNOSIS — Z1231 Encounter for screening mammogram for malignant neoplasm of breast: Secondary | ICD-10-CM | POA: Diagnosis not present

## 2018-02-23 DIAGNOSIS — H04123 Dry eye syndrome of bilateral lacrimal glands: Secondary | ICD-10-CM | POA: Diagnosis not present

## 2018-02-23 DIAGNOSIS — H2512 Age-related nuclear cataract, left eye: Secondary | ICD-10-CM | POA: Diagnosis not present

## 2018-02-23 DIAGNOSIS — H2513 Age-related nuclear cataract, bilateral: Secondary | ICD-10-CM | POA: Diagnosis not present

## 2018-04-05 DIAGNOSIS — H2512 Age-related nuclear cataract, left eye: Secondary | ICD-10-CM | POA: Diagnosis not present

## 2018-04-06 DIAGNOSIS — H2511 Age-related nuclear cataract, right eye: Secondary | ICD-10-CM | POA: Diagnosis not present

## 2018-04-26 DIAGNOSIS — H2511 Age-related nuclear cataract, right eye: Secondary | ICD-10-CM | POA: Diagnosis not present

## 2018-04-30 ENCOUNTER — Other Ambulatory Visit: Payer: Self-pay | Admitting: Family Medicine

## 2018-08-02 ENCOUNTER — Ambulatory Visit (INDEPENDENT_AMBULATORY_CARE_PROVIDER_SITE_OTHER): Payer: Medicare Other

## 2018-08-02 DIAGNOSIS — Z23 Encounter for immunization: Secondary | ICD-10-CM | POA: Diagnosis not present

## 2018-08-02 NOTE — Progress Notes (Signed)
Per Dr. Stevie Kern, patient given the high dose flu vaccine today in the left deltoid. Patient tolerated well.

## 2018-09-09 ENCOUNTER — Other Ambulatory Visit: Payer: Self-pay

## 2018-09-21 ENCOUNTER — Other Ambulatory Visit: Payer: Self-pay | Admitting: Family Medicine

## 2018-10-06 ENCOUNTER — Encounter: Payer: Self-pay | Admitting: Family Medicine

## 2018-10-06 ENCOUNTER — Ambulatory Visit: Payer: Medicare Other | Admitting: Family Medicine

## 2018-10-06 ENCOUNTER — Ambulatory Visit (INDEPENDENT_AMBULATORY_CARE_PROVIDER_SITE_OTHER): Payer: Medicare Other | Admitting: Family Medicine

## 2018-10-06 VITALS — BP 124/76 | HR 79 | Temp 97.8°F | Resp 16 | Ht 61.0 in | Wt 155.1 lb

## 2018-10-06 DIAGNOSIS — I10 Essential (primary) hypertension: Secondary | ICD-10-CM | POA: Diagnosis not present

## 2018-10-06 DIAGNOSIS — E785 Hyperlipidemia, unspecified: Secondary | ICD-10-CM | POA: Diagnosis not present

## 2018-10-06 DIAGNOSIS — M5441 Lumbago with sciatica, right side: Secondary | ICD-10-CM | POA: Diagnosis not present

## 2018-10-06 DIAGNOSIS — G8929 Other chronic pain: Secondary | ICD-10-CM

## 2018-10-06 DIAGNOSIS — M5442 Lumbago with sciatica, left side: Secondary | ICD-10-CM | POA: Diagnosis not present

## 2018-10-06 DIAGNOSIS — G894 Chronic pain syndrome: Secondary | ICD-10-CM

## 2018-10-06 LAB — BASIC METABOLIC PANEL
BUN: 20 mg/dL (ref 6–23)
CO2: 29 meq/L (ref 19–32)
Calcium: 9.4 mg/dL (ref 8.4–10.5)
Chloride: 103 mEq/L (ref 96–112)
Creatinine, Ser: 0.61 mg/dL (ref 0.40–1.20)
GFR: 100.62 mL/min (ref >60.00)
GLUCOSE: 91 mg/dL (ref 70–99)
POTASSIUM: 4.5 meq/L (ref 3.5–5.1)
SODIUM: 141 meq/L (ref 135–145)

## 2018-10-06 MED ORDER — BENAZEPRIL HCL 10 MG PO TABS: 10.0000 mg | ORAL_TABLET | Freq: Every day | ORAL | 3 refills | Status: DC

## 2018-10-06 MED ORDER — TRAMADOL HCL 50 MG PO TABS: ORAL_TABLET | ORAL | 1 refills | Status: DC

## 2018-10-06 NOTE — Progress Notes (Signed)
HPI:   [name] is a 77 y.o. female, who is here today to establish care.  Former PCP: Dr Sherren Mocha Last preventive routine visit: 07/2013  Chronic medical problems: HTN,allergic rhinitis, and OA among some.   Concerns today: Med refills  Long Hx of lower back pai  Radiated to LE's intermittently, tingling and slight numbness. Denies saddle anesthesia or changes in urine/bowel continence. Back pain since high school,after back injury.  Knee "pinching" like pain.  Episodes of pain are intermittent,exacerbated by repetitive movement with certain activities and weather changes. She takes Tramadol as needed. Usually a Rx for Tramadol lasts almost a year. Pain episodes last 6 days or so,during this time she cannot move and stays in bed most of the time.  Tramadol caused mild nausea but really helps with pain.  Last pain exacerbation was 6 days ago.  No joint edema or erythema.  She lives alone.  HTN: BP reading "good." < 130/80. She is on Benazepril 10 mg daily. Denies severe/frequent headache, visual changes, chest pain, dyspnea, palpitation, claudication, focal weakness, or edema.   Hyperlipidemia: Currently on non pharmacologic treatment. Following a low fat diet: Yes.   Lab Results  Component Value Date   CHOL 214 (H) 09/22/2016   HDL 90.40 09/22/2016   LDLCALC 112 (H) 09/22/2016   LDLDIRECT 105.7 07/30/2011   TRIG 58.0 09/22/2016   CHOLHDL 2 09/22/2016    Review of Systems  Constitutional: Negative for activity change, appetite change, fatigue and fever.  HENT: Negative for mouth sores, nosebleeds and trouble swallowing.   Eyes: Negative for redness and visual disturbance.  Respiratory: Negative for cough, shortness of breath and wheezing.   Cardiovascular: Negative for chest pain, palpitations and leg swelling.  Gastrointestinal: Negative for abdominal pain, nausea and vomiting.       Negative for changes in bowel habits.  Genitourinary:  Negative for decreased urine volume and hematuria.  Musculoskeletal: Positive for arthralgias and back pain.  Neurological: Positive for numbness. Negative for syncope, weakness and headaches.      Current Outpatient Medications on File Prior to Visit  Medication Sig Dispense Refill  . Calcium Carbonate-Vitamin D (CALCIUM 600 + D PO) Take by mouth daily.      . fish oil-omega-3 fatty acids 1000 MG capsule Take 2 g by mouth 2 (two) times daily.      Marland Kitchen glucosamine-chondroitin 500-400 MG tablet Take 1 tablet by mouth daily.      Marland Kitchen loratadine (CLARITIN) 10 MG tablet Take 10 mg by mouth daily.      . Multiple Vitamins-Minerals (CENTRUM SILVER PO) Take by mouth daily.       No current facility-administered medications on file prior to visit.      Past Medical History:  Diagnosis Date  . Allergic rhinitis   . Hypertension   . Migraine headache   . Osteoporosis   . PMS (premenstrual syndrome)   . Rosacea    No Known Allergies  Family History  Problem Relation Age of Onset  . Colon polyps Sister   . Colon polyps Brother   . Breast cancer Maternal Aunt   . Colon cancer Neg Hx     Social History   Socioeconomic History  . Marital status: Married    Spouse name: Not on file  . Number of children: Not on file  . Years of education: Not on file  . Highest education level: Not on file  Occupational History  . Not on file  Social Needs  . Financial resource strain: Not on file  . Food insecurity:    Worry: Not on file    Inability: Not on file  . Transportation needs:    Medical: Not on file    Non-medical: Not on file  Tobacco Use  . Smoking status: Former Smoker    Last attempt to quit: 10/20/1976    Years since quitting: 42.0  . Smokeless tobacco: Never Used  Substance and Sexual Activity  . Alcohol use: No  . Drug use: No  . Sexual activity: Not on file  Lifestyle  . Physical activity:    Days per week: Not on file    Minutes per session: Not on file  . Stress:  Not on file  Relationships  . Social connections:    Talks on phone: Not on file    Gets together: Not on file    Attends religious service: Not on file    Active member of club or organization: Not on file    Attends meetings of clubs or organizations: Not on file    Relationship status: Not on file  Other Topics Concern  . Not on file  Social History Narrative  . Not on file    Vitals:   10/06/18 0833  BP: 124/76  Pulse: 79  Resp: 16  Temp: 97.8 F (36.6 C)  SpO2: 98%    Body mass index is 29.31 kg/m.   Physical Exam  Nursing note and vitals reviewed. Constitutional: She is oriented to person, place, and time. She appears well-developed. No distress.  HENT:  Head: Normocephalic and atraumatic.  Mouth/Throat: Oropharynx is clear and moist and mucous membranes are normal.  Eyes: Pupils are equal, round, and reactive to light. Conjunctivae are normal.  Cardiovascular: Normal rate and regular rhythm.  No murmur heard. Pulses:      Dorsalis pedis pulses are 2+ on the right side and 2+ on the left side.  Respiratory: Effort normal and breath sounds normal. No respiratory distress.  GI: Soft. She exhibits no mass. There is no hepatomegaly. There is no abdominal tenderness.  Musculoskeletal:        General: No edema.     Lumbar back: She exhibits no tenderness.     Comments: Knee crepitus, R>L.   Lymphadenopathy:    She has no cervical adenopathy.  Neurological: She is alert and oriented to person, place, and time. She has normal strength. No cranial nerve deficit. Gait normal.  Skin: Skin is warm. No rash noted. No erythema.  Psychiatric: She has a normal mood and affect.  Well groomed, good eye contact.      ASSESSMENT AND PLAN:  [name] was seen today for transfer of care.  Diagnoses and all orders for this visit: Lab Results  Component Value Date   CREATININE 0.61 10/06/2018   BUN 20 10/06/2018   NA 141 10/06/2018   K 4.5 10/06/2018   CL 103  10/06/2018   CO2 29 10/06/2018    Essential hypertension Adequately controlled. No changes in current management. DASH diet recommended. Eye exam recommended annually. She has been following annually, so as far as she continued monitoring BP and there is no problem I will see her annually.  -     Basic metabolic panel -     benazepril (LOTENSIN) 10 MG tablet; Take 1 tablet (10 mg total) by mouth daily.  Chronic pain disorder No changes in Tramadol dose. Because # 30 tabs last a year, I can  see her annually. Side effects of med discussed. We also reviewed general guideline in regard to chronic pain and controlled meds.  -     traMADol (ULTRAM) 50 MG tablet; TAKE 1 TABLET BY MOUTH AT BEDTIME AS NEEDED FOR PAIN  Chronic bilateral low back pain with bilateral sciatica Stable. Recommend avoiding activities that she knows aggravate pain. Instructed about warning signs.   Hyperlipidemia, unspecified hyperlipidemia type Mild. She wants to wait until next visit to rechecked FLP> Low fat diet recommended for now.    Ellason Segar G. [name], MD  [facility]. [place] office.

## 2018-10-06 NOTE — Patient Instructions (Addendum)
A few things to remember from today's visit:   Essential hypertension - Plan: Basic metabolic panel, benazepril (LOTENSIN) 10 MG tablet  Chronic pain disorder - Plan: traMADol (ULTRAM) 50 MG tablet  No changes today. As far as Tramadol lasts a year,I can see you back annually. Monitor blood pressure periodically.  Please be sure medication list is accurate. If a new problem present, please set up appointment sooner than planned today.

## 2018-10-14 ENCOUNTER — Telehealth: Payer: Self-pay | Admitting: Family Medicine

## 2018-10-14 NOTE — Telephone Encounter (Signed)
Copied from [place] [id number]. Topic: Quick Communication - Rx Refill/Question >> Oct 14, 2018 11:22 AM Virl Axe D wrote: Medication: tiZANidine (ZANAFLEX) 4 MG tablet / Pt stated that Dr. [name] forgot to send in rx for this medication / Former pt of Dr. Sherren Mocha Not on current list / Please advise  Has the patient contacted their pharmacy? Yes.   (Agent: If no, request that the patient contact the pharmacy for the refill.) (Agent: If yes, when and what did the pharmacy advise?)  Preferred Pharmacy (with phone number or street name): [place], [place] [phone] (Phone) [phone] (Fax)    Agent: Please be advised that RX refills may take up to 3 business days. We ask that you follow-up with your pharmacy.

## 2018-10-14 NOTE — Telephone Encounter (Signed)
Patient says she saw Dr. [name] on 10/06/18 and she forgot to send in prescription for Zanaflex. Med is not on current med list in chart.

## 2018-10-18 ENCOUNTER — Other Ambulatory Visit: Payer: Self-pay | Admitting: Family Medicine

## 2018-10-18 MED ORDER — TIZANIDINE HCL 4 MG PO TABS: 2.0000 mg | ORAL_TABLET | Freq: Three times a day (TID) | ORAL | 2 refills | Status: DC | PRN

## 2018-10-18 NOTE — Telephone Encounter (Signed)
Message sent to Dr. Jordan for review and approval. 

## 2018-10-18 NOTE — Telephone Encounter (Signed)
Prescription for Zanaflex 4 mg tablet to try 2 to 4 mg 3 times daily as needed sent to her pharmacy. Explained that medication can cause drowsiness, so she can take it at bedtime. Thanks, BJ

## 2018-10-18 NOTE — Telephone Encounter (Signed)
Patient notified

## 2018-10-19 ENCOUNTER — Other Ambulatory Visit: Payer: Self-pay | Admitting: *Deleted

## 2018-10-19 MED ORDER — TIZANIDINE HCL 4 MG PO TABS: 2.0000 mg | ORAL_TABLET | Freq: Three times a day (TID) | ORAL | 0 refills | Status: DC | PRN

## 2018-10-21 ENCOUNTER — Other Ambulatory Visit: Payer: Self-pay | Admitting: *Deleted

## 2018-10-21 MED ORDER — TIZANIDINE HCL 4 MG PO TABS: 2.0000 mg | ORAL_TABLET | Freq: Three times a day (TID) | ORAL | 0 refills | Status: DC | PRN

## 2018-12-03 DIAGNOSIS — Z961 Presence of intraocular lens: Secondary | ICD-10-CM | POA: Diagnosis not present

## 2018-12-03 DIAGNOSIS — H18413 Arcus senilis, bilateral: Secondary | ICD-10-CM | POA: Diagnosis not present

## 2018-12-03 DIAGNOSIS — H02831 Dermatochalasis of right upper eyelid: Secondary | ICD-10-CM | POA: Diagnosis not present

## 2018-12-03 DIAGNOSIS — H04123 Dry eye syndrome of bilateral lacrimal glands: Secondary | ICD-10-CM | POA: Diagnosis not present

## 2018-12-16 ENCOUNTER — Telehealth: Payer: Self-pay | Admitting: Family Medicine

## 2018-12-16 NOTE — Telephone Encounter (Signed)
See CRM # [id number]; will route to office for final disposition; pt of Dr [name].

## 2018-12-16 NOTE — Telephone Encounter (Signed)
Copied from [place] [id number]. Topic: Quick Communication - Rx Refill/Question >> Dec 16, 2018  7:32 AM Scherrie Gerlach wrote: Medication: benazepril (LOTENSIN) 10 MG tablet  Pt has new insurance and needs to use mail order for her prescriptions. Needs 90 day sent to: [facility] [place], [place] to [employer] [phone] (Phone) [phone] (Fax)

## 2018-12-17 ENCOUNTER — Other Ambulatory Visit: Payer: Self-pay | Admitting: *Deleted

## 2018-12-17 DIAGNOSIS — I10 Essential (primary) hypertension: Secondary | ICD-10-CM

## 2018-12-17 MED ORDER — BENAZEPRIL HCL 10 MG PO TABS: 10.0000 mg | ORAL_TABLET | Freq: Every day | ORAL | 3 refills | Status: DC

## 2018-12-17 NOTE — Telephone Encounter (Signed)
Rx for b/p medication sent to pharmacy as requested by patient.

## 2019-01-21 ENCOUNTER — Telehealth: Payer: Self-pay

## 2019-01-21 NOTE — Telephone Encounter (Signed)
Author phoned pt. to assess interest in scheduling virtual awv. Pt. Stated she was interested, has an ipad, but not techy-savy. Pt. Stated she has email access, e-mail confirmed. Author stated CMA would contact pt. To ensure pt. Received email and give instructions. Pt. Verbalized understanding, appreciative.

## 2019-01-21 NOTE — Telephone Encounter (Signed)
Webex appointment scheduled for 01/26/2019 with PCP.

## 2019-01-26 ENCOUNTER — Ambulatory Visit: Payer: Medicare Other | Admitting: Family Medicine

## 2019-01-26 NOTE — Telephone Encounter (Signed)
Pt stated she would like to cancel her Webex appointment with Dr. [name] today. She is having Internet issues. She declined a telephone appointment. Please advise.

## 2019-01-27 NOTE — Telephone Encounter (Signed)
Spoke with patient and rescheduled appointment for Monday, 01/31/2019.

## 2019-01-31 ENCOUNTER — Ambulatory Visit: Payer: Medicare Other | Admitting: Family Medicine

## 2019-01-31 ENCOUNTER — Other Ambulatory Visit: Payer: Self-pay

## 2019-08-01 ENCOUNTER — Ambulatory Visit (INDEPENDENT_AMBULATORY_CARE_PROVIDER_SITE_OTHER): Payer: Medicare Other | Admitting: Family Medicine

## 2019-08-01 ENCOUNTER — Other Ambulatory Visit: Payer: Self-pay

## 2019-08-01 ENCOUNTER — Encounter: Payer: Self-pay | Admitting: Family Medicine

## 2019-08-01 VITALS — BP 130/74 | HR 76 | Temp 97.6°F | Resp 16 | Ht 61.0 in | Wt 155.1 lb

## 2019-08-01 DIAGNOSIS — E785 Hyperlipidemia, unspecified: Secondary | ICD-10-CM

## 2019-08-01 DIAGNOSIS — M5442 Lumbago with sciatica, left side: Secondary | ICD-10-CM

## 2019-08-01 DIAGNOSIS — M5441 Lumbago with sciatica, right side: Secondary | ICD-10-CM

## 2019-08-01 DIAGNOSIS — G8929 Other chronic pain: Secondary | ICD-10-CM

## 2019-08-01 DIAGNOSIS — G894 Chronic pain syndrome: Secondary | ICD-10-CM

## 2019-08-01 DIAGNOSIS — I10 Essential (primary) hypertension: Secondary | ICD-10-CM

## 2019-08-01 DIAGNOSIS — Z0000 Encounter for general adult medical examination without abnormal findings: Secondary | ICD-10-CM

## 2019-08-01 DIAGNOSIS — Z780 Asymptomatic menopausal state: Secondary | ICD-10-CM | POA: Diagnosis not present

## 2019-08-01 DIAGNOSIS — Z23 Encounter for immunization: Secondary | ICD-10-CM

## 2019-08-01 LAB — LIPID PANEL
Cholesterol: 226 mg/dL — ABNORMAL HIGH (ref 0–200)
HDL: 92.5 mg/dL (ref >39.00)
LDL Cholesterol: 122 mg/dL — ABNORMAL HIGH (ref 0–99)
NonHDL: 133.18
Total CHOL/HDL Ratio: 2
Triglycerides: 56 mg/dL (ref 0.0–149.0)
VLDL: 11.2 mg/dL (ref 0.0–40.0)

## 2019-08-01 LAB — COMPREHENSIVE METABOLIC PANEL
ALT: 15 U/L (ref 0–35)
AST: 22 U/L (ref 0–37)
Albumin: 4.4 g/dL (ref 3.5–5.2)
Alkaline Phosphatase: 85 U/L (ref 39–117)
BUN: 23 mg/dL (ref 6–23)
CO2: 29 mEq/L (ref 19–32)
Calcium: 9.6 mg/dL (ref 8.4–10.5)
Chloride: 102 mEq/L (ref 96–112)
Creatinine, Ser: 0.62 mg/dL (ref 0.40–1.20)
GFR: 92.72 mL/min (ref >60.00)
Glucose, Bld: 89 mg/dL (ref 70–99)
Potassium: 4.4 mEq/L (ref 3.5–5.1)
Sodium: 140 mEq/L (ref 135–145)
Total Bilirubin: 0.6 mg/dL (ref 0.2–1.2)
Total Protein: 7.4 g/dL (ref 6.0–8.3)

## 2019-08-01 MED ORDER — TIZANIDINE HCL 4 MG PO TABS: 2.0000 mg | ORAL_TABLET | Freq: Two times a day (BID) | ORAL | 1 refills | Status: DC | PRN

## 2019-08-01 NOTE — Patient Instructions (Addendum)
  Tricia Newman , Thank you for taking time to come for your Medicare Wellness Visit. I appreciate your ongoing commitment to your health goals. Please review the following plan we discussed and let me know if I can assist you in the future.   These are the goals we discussed: Goals   None     This is a list of the screening recommended for you and due dates:  Health Maintenance  Topic Date Due  . DEXA scan (bone density measurement)  01/01/2005  . Colon Cancer Screening  09/26/2018  . Flu Shot  05/21/2019  . Tetanus Vaccine  09/22/2026  . Pneumonia vaccines  Completed    A few tips:  -As we age balance is not as good as it was, so there is a higher risks for falls. Please remove small rugs and furniture that is "in your way" and could increase the risk of falls. Stretching exercises may help with fall prevention: Yoga and Tai Chi are some examples. Low impact exercise is better, so you are not very achy the next day.  -Sun screen and avoidance of direct sun light recommended. Caution with dehydration, if working outdoors be sure to drink enough fluids.  - Some medications are not safe as we age, increases the risk of side effects and can potentially interact with other medication you are also taken;  including some of over the counter medications. Be sure to let me know when you start a new medication even if it is a dietary/vitamin supplement.   -Healthy diet low in red meet/animal fat and sugar + regular physical activity is recommended.     Screening schedule for the next 5-10 years:  Glaucoma screening/eye exam every 1-2 years. DEXA will be ordered. Mammogram for breast cancer screening annually.  Flu vaccine annually.  Diabetes screening   Fall prevention   Advance directives:  Please see a lawyer and/or go to this website to help you with advanced directives and designating a health care power of attorney so that your wishes will be followed should you become too ill  to make your own medical decisions.  [email or web address]  A few things to remember from today's visit:   Essential hypertension  Hyperlipidemia, unspecified hyperlipidemia type  Medicare annual wellness visit, subsequent  Chronic pain disorder   Continue monitoring blood pressure. If blood pressure is frequently on there 110/60, we may need to decrease dose of benazepril. Remember tramadol and Zanaflex can increase her risk for falls.

## 2019-08-01 NOTE — Progress Notes (Addendum)
HPI:   Tricia Newman is a 78 y.o. female, who is here today for chronic disease management and for her AWV.  Last visit on 10/06/2018.  She lives alone, her husband died in 36.  She has friends and good relation with neighbors. Independent ADL's and IADL's. No falls in the past year and denies depression symptoms.  Enjoys working outdoors, she has a garden.  Functional Status Survey: Is the patient deaf or have difficulty hearing?: No Does the patient have difficulty seeing, even when wearing glasses/contacts?: No Does the patient have difficulty concentrating, remembering, or making decisions?: No Does the patient have difficulty walking or climbing stairs?: No Does the patient have difficulty dressing or bathing?: No Does the patient have difficulty doing errands alone such as visiting a doctor's office or shopping?: No  Fall Risk  08/01/2019 09/09/2018 10/06/2017 09/22/2016 09/11/2015  Falls in the past year? 0 1 Yes No No  Comment - Emmi Telephone Survey: data to providers prior to load - - -  Number falls in past yr: 0 1 1 - -  Comment - Emmi Telephone Survey Actual Response = 1 - - -  Injury with Fall? 0 0 No - -  Risk for fall due to : Orthopedic patient - Other (Comment) - -  Follow up Education provided - - - -   Providers she sees regularly:  Eye care provider: At [facility], Dr Talbert Forest. Last eye exam 06/2019. Dentist every 6 months.  Depression screen [facility] 2/9 08/01/2019  Decreased Interest 0  Down, Depressed, Hopeless 0  PHQ - 2 Score 0    Mini-Cog - 08/01/19 0738    Normal clock drawing test?  yes    How many words correct?  3        Hearing Screening   125Hz  250Hz  500Hz  1000Hz  2000Hz  3000Hz  4000Hz  6000Hz  8000Hz   Right ear:           Left ear:             Visual Acuity Screening   Right eye Left eye Both eyes  Without correction: 20/25 20/30 20/25   With correction:       HTN: Denies severe/frequent headache, visual changes, chest  pain, dyspnea, palpitation, claudication, focal weakness, or edema. Currently she is on benazepril 10 mg daily. She states that sometimes after working 4 hours outdoors, her BP can be 90's-100/70's associated with mild lightheadedness. It seems to be better if she drinks fluids.  Lab Results  Component Value Date   CREATININE 0.61 10/06/2018   BUN 20 10/06/2018   NA 141 10/06/2018   K 4.5 10/06/2018   CL 103 10/06/2018   CO2 29 10/06/2018    Hyperlipidemia: Currently she is on no pharmacologic treatment.  Lab Results  Component Value Date   CHOL 214 (H) 09/22/2016   HDL 90.40 09/22/2016   LDLCALC 112 (H) 09/22/2016   LDLDIRECT 105.7 07/30/2011   TRIG 58.0 09/22/2016   CHOLHDL 2 09/22/2016   Chronic back pain, currently she is on tramadol 50 mg daily as needed. Lower back pain radiated to lower extremities with intermittent tingling and mild numbness sensation. Negative for saddle anesthesia or urinary/bowel dysfunction. Problem has been a stable. Usually 30 tablets last a year. Also taking tizanidine 2 to 4 mg daily as needed, she feels like medication helps.  She has not noticed side effects.  Review of Systems  Constitutional: Negative for activity change, appetite change, fatigue, fever and unexpected weight  change.  HENT: Negative for mouth sores, nosebleeds and trouble swallowing.   Eyes: Negative for redness and visual disturbance.  Respiratory: Negative for cough and wheezing.   Gastrointestinal: Negative for abdominal pain, nausea and vomiting.       Negative for changes in bowel habits.  Genitourinary: Negative for decreased urine volume, dysuria and hematuria.  Musculoskeletal: Positive for arthralgias. Negative for gait problem.  Skin: Negative for rash and wound.  Neurological: Negative for syncope and facial asymmetry.  Psychiatric/Behavioral: Negative for confusion and hallucinations.  Rest see pertinent positives and negatives per HPI.   Current  Outpatient Medications on File Prior to Visit  Medication Sig Dispense Refill  . benazepril (LOTENSIN) 10 MG tablet Take 1 tablet (10 mg total) by mouth daily. 90 tablet 3  . Calcium Carbonate-Vitamin D (CALCIUM 600 + D PO) Take by mouth daily.      . fish oil-omega-3 fatty acids 1000 MG capsule Take 2 g by mouth 2 (two) times daily.      Marland Kitchen glucosamine-chondroitin 500-400 MG tablet Take 1 tablet by mouth daily.      Marland Kitchen loratadine (CLARITIN) 10 MG tablet Take 10 mg by mouth daily.      . Multiple Vitamins-Minerals (CENTRUM SILVER PO) Take by mouth daily.      . traMADol (ULTRAM) 50 MG tablet TAKE 1 TABLET BY MOUTH AT BEDTIME AS NEEDED FOR PAIN 30 tablet 1   No current facility-administered medications on file prior to visit.      Past Medical History:  Diagnosis Date  . Allergic rhinitis   . Hypertension   . Migraine headache   . Osteoporosis   . PMS (premenstrual syndrome)   . Rosacea    No Known Allergies  Social History   Socioeconomic History  . Marital status: Married    Spouse name: Not on file  . Number of children: Not on file  . Years of education: Not on file  . Highest education level: Not on file  Occupational History  . Not on file  Social Needs  . Financial resource strain: Not on file  . Food insecurity    Worry: Not on file    Inability: Not on file  . Transportation needs    Medical: Not on file    Non-medical: Not on file  Tobacco Use  . Smoking status: Former Smoker    Quit date: 10/20/1976    Years since quitting: 42.8  . Smokeless tobacco: Never Used  Substance and Sexual Activity  . Alcohol use: No  . Drug use: No  . Sexual activity: Not on file  Lifestyle  . Physical activity    Days per week: Not on file    Minutes per session: Not on file  . Stress: Not on file  Relationships  . Social Herbalist on phone: Not on file    Gets together: Not on file    Attends religious service: Not on file    Active member of club or  organization: Not on file    Attends meetings of clubs or organizations: Not on file    Relationship status: Not on file  Other Topics Concern  . Not on file  Social History Narrative  . Not on file    Vitals:   08/01/19 0703  BP: 130/74  Pulse: 76  Resp: 16  Temp: 97.6 F (36.4 C)  SpO2: 99%   Body mass index is 29.31 kg/m.  Physical Exam  Nursing note  and vitals reviewed. Constitutional: She is oriented to person, place, and time. She appears well-developed. No distress.  HENT:  Head: Normocephalic and atraumatic.  Mouth/Throat: Oropharynx is clear and moist and mucous membranes are normal.  Eyes: Pupils are equal, round, and reactive to light. Conjunctivae are normal.  Cardiovascular: Normal rate and regular rhythm.  Murmur (Soft SEM LUSB/reporting hx of murmur.) heard. Pulses:      Dorsalis pedis pulses are 2+ on the right side and 2+ on the left side.  Respiratory: Effort normal and breath sounds normal. No respiratory distress.  GI: Soft. She exhibits no mass. There is no hepatomegaly. There is no abdominal tenderness.  Musculoskeletal:        General: No edema.     Lumbar back: She exhibits no tenderness and no bony tenderness.     Comments: No signs of synovitis.  Lymphadenopathy:    She has no cervical adenopathy.  Neurological: She is alert and oriented to person, place, and time. She has normal strength. No cranial nerve deficit. Gait normal.  Skin: Skin is warm. No rash noted. No erythema.  Psychiatric: She has a normal mood and affect.  Well groomed, good eye contact.    ASSESSMENT AND PLAN:  Tricia Newman was seen today for medicare wellness and follow-up.  Diagnoses and all orders for this visit:  Orders Placed This Encounter  Procedures  . DG Bone Density  . Flu Vaccine QUAD High Dose(Fluad)  . Comprehensive metabolic panel  . Lipid panel    Lab Results  Component Value Date   CHOL 226 (H) 08/01/2019   HDL 92.50 08/01/2019   LDLCALC 122 (H)  08/01/2019   LDLDIRECT 105.7 07/30/2011   TRIG 56.0 08/01/2019   CHOLHDL 2 08/01/2019   Lab Results  Component Value Date   ALT 15 08/01/2019   AST 22 08/01/2019   ALKPHOS 85 08/01/2019   BILITOT 0.6 08/01/2019   Lab Results  Component Value Date   CREATININE 0.62 08/01/2019   BUN 23 08/01/2019   NA 140 08/01/2019   K 4.4 08/01/2019   CL 102 08/01/2019   CO2 29 08/01/2019    Medicare annual wellness visit, subsequent We discussed the importance of staying active, physically and mentally, as well as the benefits of a healthy/balnace diet. Low impact exercise that involve stretching and strengthing are ideal. Vaccines up to date. We discussed preventive screening for the next 5-10 years, summery of recommendations given in AVS. Fall prevention. DEXA will be arranged..  Advance directives and end of life discussed, she has POA and living will.   Essential hypertension Overall BP is adequately controlled. Continue monitoring BP regularly. Adequate hydration while working outdoors, she could decrease benazepril dose from 10 mg to 5 mg if planning on working outdoors. Possible complications of elevated as well as low BP discussed. Continue low salt diet. Eye exam is current.  -     Comprehensive metabolic panel  Hyperlipidemia, unspecified hyperlipidemia type Continue low-fat diet. Further recommendation will be given according to lipid panel results.  The 10-year ASCVD risk score Mikey Bussing DC Brooke Bonito., et al., 2013) is: 34.2%   Values used to calculate the score:     Age: 43 years     Sex: Female     Is Non-Hispanic African American: No     Diabetic: No     Tobacco smoker: No     Systolic Blood Pressure: [id number]     Is BP treated: Yes     HDL  Cholesterol: 92.5 mg/dL     Total Cholesterol: 226 mg/dL  Chronic pain disorder We discussed current guidelines in regard to pain management with opioid or similar medication. She would like to continue annual follow-ups, because  30 tablets of tramadol last almost a year, I think is appropriate. Follow-up shoulder if she needs to increase Tramadol dose or pain gets worse.  Asymptomatic postmenopausal estrogen deficiency -     DG Bone Density; Future  Need for influenza vaccination -     Flu Vaccine QUAD High Dose(Fluad)  Chronic bilateral low back pain with bilateral sciatica Problem is stable. Continue Zanaflex 2-4 mg bid prn and Tramadol 50 mg daily as needed. Fall precautions. Side effects of meds discussed.  -     tiZANidine (ZANAFLEX) 4 MG tablet; Take 0.5-1 tablets (2-4 mg total) by mouth every 12 (twelve) hours as needed for muscle spasms. Please establish with a new provider for further refills   Return in about 1 year (around 07/31/2020) for AWV and f/u.  -[name] was advised to return sooner than planned today if new concerns arise.   Tricia Khurana G. [name], MD  [facility]. [place] office.

## 2019-08-04 ENCOUNTER — Other Ambulatory Visit: Payer: Self-pay | Admitting: Family Medicine

## 2019-08-04 DIAGNOSIS — Z1231 Encounter for screening mammogram for malignant neoplasm of breast: Secondary | ICD-10-CM

## 2019-10-26 ENCOUNTER — Ambulatory Visit: Payer: Medicare Other

## 2019-10-26 ENCOUNTER — Other Ambulatory Visit: Payer: Medicare Other

## 2019-10-27 ENCOUNTER — Other Ambulatory Visit: Payer: Medicare Other

## 2019-10-27 ENCOUNTER — Ambulatory Visit: Payer: Medicare Other

## 2019-12-08 ENCOUNTER — Ambulatory Visit: Payer: Medicare Other

## 2019-12-08 ENCOUNTER — Other Ambulatory Visit: Payer: Medicare Other

## 2020-01-12 ENCOUNTER — Ambulatory Visit: Payer: Medicare Other | Attending: Internal Medicine

## 2020-01-12 DIAGNOSIS — Z23 Encounter for immunization: Secondary | ICD-10-CM

## 2020-01-12 NOTE — Progress Notes (Signed)
   Covid-19 Vaccination Clinic  Name:  [name]    MRN: [id number] DOB: [birth year]  01/12/2020  [name] was observed post Covid-19 immunization for 15 minutes without incident. She was provided with Vaccine Information Sheet and instruction to access the V-Safe system.   [name] was instructed to call 911 with any severe reactions post vaccine: Marland Kitchen Difficulty breathing  . Swelling of face and throat  . A fast heartbeat  . A bad rash all over body  . Dizziness and weakness   Immunizations Administered    Name Date Dose VIS Date Route   Pfizer COVID-19 Vaccine 01/12/2020  8:52 AM 0.3 mL 09/30/2019 Intramuscular   Manufacturer: [place]   Lot: [id number]   [place]: [id number]

## 2020-01-30 ENCOUNTER — Ambulatory Visit: Payer: Medicare Other

## 2020-02-02 DIAGNOSIS — H18413 Arcus senilis, bilateral: Secondary | ICD-10-CM | POA: Diagnosis not present

## 2020-02-02 DIAGNOSIS — H04123 Dry eye syndrome of bilateral lacrimal glands: Secondary | ICD-10-CM | POA: Diagnosis not present

## 2020-02-02 DIAGNOSIS — Z961 Presence of intraocular lens: Secondary | ICD-10-CM | POA: Diagnosis not present

## 2020-02-02 DIAGNOSIS — H02831 Dermatochalasis of right upper eyelid: Secondary | ICD-10-CM | POA: Diagnosis not present

## 2020-02-06 ENCOUNTER — Ambulatory Visit: Payer: Medicare Other | Attending: Internal Medicine

## 2020-02-06 DIAGNOSIS — Z23 Encounter for immunization: Secondary | ICD-10-CM

## 2020-02-06 NOTE — Progress Notes (Signed)
   Covid-19 Vaccination Clinic  Name:  Tricia Newman    MRN: [id number] DOB: 08-13-1941  02/06/2020  [name] was observed post Covid-19 immunization for 15 minutes without incident. She was provided with Vaccine Information Sheet and instruction to access the V-Safe system.   [name] was instructed to call 911 with any severe reactions post vaccine: Marland Kitchen Difficulty breathing  . Swelling of face and throat  . A fast heartbeat  . A bad rash all over body  . Dizziness and weakness   Immunizations Administered    Name Date Dose VIS Date Route   Pfizer COVID-19 Vaccine 02/06/2020  8:40 AM 0.3 mL 12/14/2018 Intramuscular   Manufacturer: [place]   Lot: [id number]   [place]myra: [id number]

## 2020-02-22 ENCOUNTER — Ambulatory Visit: Payer: Medicare Other

## 2020-02-22 ENCOUNTER — Other Ambulatory Visit: Payer: Medicare Other

## 2020-04-24 ENCOUNTER — Other Ambulatory Visit: Payer: Self-pay

## 2020-04-24 ENCOUNTER — Ambulatory Visit
Admission: RE | Admit: 2020-04-24 | Discharge: 2020-04-24 | Disposition: A | Discharge status: Home / Self Care | Payer: Medicare Other | Source: Ambulatory Visit | Attending: Family Medicine | Admitting: Family Medicine

## 2020-04-24 DIAGNOSIS — Z780 Asymptomatic menopausal state: Secondary | ICD-10-CM

## 2020-04-24 DIAGNOSIS — Z1231 Encounter for screening mammogram for malignant neoplasm of breast: Secondary | ICD-10-CM

## 2020-07-13 ENCOUNTER — Other Ambulatory Visit: Payer: Self-pay | Admitting: Family Medicine

## 2020-07-13 DIAGNOSIS — I10 Essential (primary) hypertension: Secondary | ICD-10-CM

## 2020-08-01 ENCOUNTER — Other Ambulatory Visit: Payer: Self-pay

## 2020-08-01 ENCOUNTER — Encounter: Payer: Self-pay | Admitting: Family Medicine

## 2020-08-01 ENCOUNTER — Ambulatory Visit (INDEPENDENT_AMBULATORY_CARE_PROVIDER_SITE_OTHER): Payer: Medicare Other | Admitting: Family Medicine

## 2020-08-01 VITALS — BP 126/80 | HR 80 | Resp 16 | Ht 61.0 in | Wt 141.0 lb

## 2020-08-01 DIAGNOSIS — M85851 Other specified disorders of bone density and structure, right thigh: Secondary | ICD-10-CM

## 2020-08-01 DIAGNOSIS — Z0000 Encounter for general adult medical examination without abnormal findings: Secondary | ICD-10-CM

## 2020-08-01 DIAGNOSIS — M5442 Lumbago with sciatica, left side: Secondary | ICD-10-CM | POA: Diagnosis not present

## 2020-08-01 DIAGNOSIS — I10 Essential (primary) hypertension: Secondary | ICD-10-CM | POA: Diagnosis not present

## 2020-08-01 DIAGNOSIS — G894 Chronic pain syndrome: Secondary | ICD-10-CM | POA: Diagnosis not present

## 2020-08-01 DIAGNOSIS — M5441 Lumbago with sciatica, right side: Secondary | ICD-10-CM | POA: Diagnosis not present

## 2020-08-01 DIAGNOSIS — Z23 Encounter for immunization: Secondary | ICD-10-CM

## 2020-08-01 DIAGNOSIS — G8929 Other chronic pain: Secondary | ICD-10-CM | POA: Diagnosis not present

## 2020-08-01 DIAGNOSIS — E785 Hyperlipidemia, unspecified: Secondary | ICD-10-CM

## 2020-08-01 MED ORDER — TIZANIDINE HCL 4 MG PO TABS: 2.0000 mg | ORAL_TABLET | Freq: Two times a day (BID) | ORAL | 1 refills | Status: DC | PRN

## 2020-08-01 NOTE — Assessment & Plan Note (Addendum)
It seems to be stable. We discussed current recommendations in regard to chronic opioid/like opioid meds for pain management.  Continue tramadol 50 mg daily as needed, usually 30 tablets last over a year.  She will follow if pain gets worse.

## 2020-08-01 NOTE — Assessment & Plan Note (Signed)
Continue nonpharmacologic treatment. Further recommendation will be given according to lipid panel results. 

## 2020-08-01 NOTE — Progress Notes (Signed)
HPI: [name] is a 79 y.o. female, who is here today for AWV and 12 months follow up.   She was last seen on 08/01/19.  She lives alone. She has good neighbors that check on her periodically.  Independent ADL's and IADL's. She has lost some wt. Following plant based diet. She eats some chicken and fish. She is active with chores around her house, including outdoor activities. Enjoys working on her garden.  Functional Status Survey: Is the patient deaf or have difficulty hearing?: No Does the patient have difficulty seeing, even when wearing glasses/contacts?: No Does the patient have difficulty concentrating, remembering, or making decisions?: No Does the patient have difficulty walking or climbing stairs?: No Does the patient have difficulty dressing or bathing?: No Does the patient have difficulty doing errands alone such as visiting a doctor's office or shopping?: No  Fall Risk  08/01/2020 08/01/2019 09/09/2018 10/06/2017 09/22/2016  Falls in the past year? 0 0 1 Yes No  Comment - - Emmi Telephone Survey: data to providers prior to load - -  Number falls in past yr: - 0 1 1 -  Comment - - Emmi Telephone Survey Actual Response = 1 - -  Injury with Fall? - 0 0 No -  Risk for fall due to : - Orthopedic patient - Other (Comment) -  Follow up - Education provided - - -   Immunization History  Administered Date(s) Administered  . Fluad Quad(high Dose 65+) 08/01/2019, 08/01/2020  . Influenza Split 07/30/2011, 08/02/2012  . Influenza Whole 10/20/2004, 07/21/2008, 07/31/2009, 06/27/2010  . Influenza, High Dose Seasonal PF 08/04/2013, 07/24/2015, 07/22/2016, 08/03/2017, 08/02/2018  . Influenza,inj,Quad PF,6+ Mos 08/09/2014  . PFIZER SARS-COV-2 Vaccination 01/12/2020, 02/06/2020  . Pneumococcal Conjugate-13 08/09/2014  . Pneumococcal Polysaccharide-23 10/20/2004, 08/04/2013  . Td 12/19/1995, 06/18/2006  . Tdap 09/22/2016  . Zoster 12/07/2007    Providers she  sees regularly: Eye care provider: Dr Tricia Newman. She also sees her dentist q 6-12 months.  Depression screen [facility] 2/9 08/01/2020  Decreased Interest 0  Down, Depressed, Hopeless 0  PHQ - 2 Score 0     Hearing Screening   125Hz 250Hz 500Hz 1000Hz 2000Hz 3000Hz 4000Hz 6000Hz 8000Hz  Right ear:           Left ear:             Visual Acuity Screening   Right eye Left eye Both eyes  Without correction: 20/20 20/20 20/20  With correction:      RLB pain, exacerbated by some activities and alleviated by resting for 10 min. Problem has been stable. Occasionally pain radiates to LE' minimal tingling/numbness. Negative for saddle anesthesia and bowel/bladder incontinence.  She knows which activities to avoid and when to stop. She takes Tramadol 50 mg daily as needed. Usually 30 tab of tramadol last a year. Still tolerating medication well.  She is also on Zanaflex 4 mg 1/2-4 mg daily as needed.  Concerned about possible memory problems. Sometimes she misplaces  Objects, no more than usual.  HTN: BP sometimes low after hours of yard work, causes dizziness. This has been going on for years. 90's/50's.  This is not very often. She is on Benazepril 10 mg daily. Negative for severe/frequent headache, visual changes, chest pain, dyspnea, palpitation, focal weakness, or edema.  Lab Results  Component Value Date   CREATININE 0.62 08/01/2019   BUN 23 08/01/2019   NA 140 08/01/2019   K 4.4 08/01/2019   CL 102  08/01/2019   CO2 29 08/01/2019   HLD: She is on non pharmacologic treatment. Lab Results  Component Value Date   CHOL 226 (H) 08/01/2019   HDL 92.50 08/01/2019   LDLCALC 122 (H) 08/01/2019   LDLDIRECT 105.7 07/30/2011   TRIG 56.0 08/01/2019   CHOLHDL 2 08/01/2019   Osteopenia: DEXA 04/24/20:Osteopenia. FRAX score 12.4% and 2.7% for major osteoporotic and hip fracture respectively.  Review of Systems  Constitutional: Negative for activity change, appetite change, fatigue  and fever.  HENT: Negative for mouth sores, nosebleeds and sore throat.   Respiratory: Negative for cough and wheezing.   Gastrointestinal: Negative for abdominal pain, nausea and vomiting.       Negative for changes in bowel habits.  Genitourinary: Negative for decreased urine volume, dysuria and hematuria.  Musculoskeletal: Positive for arthralgias and back pain. Negative for gait problem.  Neurological: Negative for syncope, facial asymmetry and weakness.  Rest of ROS, see pertinent positives sand negatives in HPI  Current Outpatient Medications on File Prior to Visit  Medication Sig Dispense Refill  . benazepril (LOTENSIN) 10 MG tablet TAKE 1 TABLET DAILY 90 tablet 3  . Calcium Carbonate-Vitamin D (CALCIUM 600 + D PO) Take by mouth daily.      . fish oil-omega-3 fatty acids 1000 MG capsule Take 2 g by mouth 2 (two) times daily.      Marland Kitchen loratadine (CLARITIN) 10 MG tablet Take 10 mg by mouth daily.      . Multiple Vitamins-Minerals (CENTRUM SILVER PO) Take by mouth daily.       No current facility-administered medications on file prior to visit.   Past Medical History:  Diagnosis Date  . Allergic rhinitis   . Hypertension   . Migraine headache   . Osteoporosis   . PMS (premenstrual syndrome)   . Rosacea    No Known Allergies  Social History   Socioeconomic History  . Marital status: Married    Spouse name: Not on file  . Number of children: Not on file  . Years of education: Not on file  . Highest education level: Not on file  Occupational History  . Not on file  Tobacco Use  . Smoking status: Former Smoker    Quit date: 10/20/1976    Years since quitting: 43.8  . Smokeless tobacco: Never Used  Substance and Sexual Activity  . Alcohol use: No  . Drug use: No  . Sexual activity: Not on file  Other Topics Concern  . Not on file  Social History Narrative  . Not on file   Social Determinants of Health   Financial Resource Strain:   . Difficulty of Paying Living  Expenses: Not on file  Food Insecurity:   . Worried About Charity fundraiser in the Last Year: Not on file  . Ran Out of Food in the Last Year: Not on file  Transportation Needs:   . Lack of Transportation (Medical): Not on file  . Lack of Transportation (Non-Medical): Not on file  Physical Activity:   . Days of Exercise per Week: Not on file  . Minutes of Exercise per Session: Not on file  Stress:   . Feeling of Stress : Not on file  Social Connections:   . Frequency of Communication with Friends and Family: Not on file  . Frequency of Social Gatherings with Friends and Family: Not on file  . Attends Religious Services: Not on file  . Active Member of Clubs or Organizations: Not on  file  . Attends Archivist Meetings: Not on file  . Marital Status: Not on file   Vitals:   08/01/20 0652  BP: 126/80  Pulse: 80  Resp: 16  SpO2: 98%   Body mass index is 26.64 kg/m.  Physical Exam Vitals and nursing note reviewed.  Constitutional:      General: She is not in acute distress.    Appearance: She is well-developed.  HENT:     Head: Normocephalic and atraumatic.     Mouth/Throat:     Mouth: Mucous membranes are moist.     Pharynx: Oropharynx is clear.  Eyes:     Conjunctiva/sclera: Conjunctivae normal.     Pupils: Pupils are equal, round, and reactive to light.  Cardiovascular:     Rate and Rhythm: Normal rate and regular rhythm.     Pulses:          Dorsalis pedis pulses are 2+ on the right side and 2+ on the left side.     Heart sounds: Murmur (Soft SEM LUSB) heard.   Pulmonary:     Effort: Pulmonary effort is normal. No respiratory distress.     Breath sounds: Normal breath sounds.  Abdominal:     Palpations: Abdomen is soft. There is no hepatomegaly or mass.     Tenderness: There is no abdominal tenderness.  Musculoskeletal:     Lumbar back: No tenderness or bony tenderness.  Lymphadenopathy:     Cervical: No cervical adenopathy.  Skin:    General:  Skin is warm.     Findings: No erythema or rash.  Neurological:     Mental Status: She is alert and oriented to person, place, and time.     Cranial Nerves: No cranial nerve deficit.     Gait: Gait normal.  Psychiatric:     Comments: Well groomed, good eye contact.   ASSESSMENT AND PLAN:  [name] was seen today for AWV and annual follow-up.  Orders Placed This Encounter  Procedures  . Flu Vaccine QUAD High Dose(Fluad)  . COMPLETE METABOLIC PANEL WITH GFR  . Lipid panel   Lab Results  Component Value Date   CHOL 193 08/01/2020   HDL 84 08/01/2020   LDLCALC 95 08/01/2020   LDLDIRECT 105.7 07/30/2011   TRIG 62 08/01/2020   CHOLHDL 2.3 08/01/2020   Lab Results  Component Value Date   CREATININE 0.57 (L) 08/01/2020   BUN 17 08/01/2020   NA 142 08/01/2020   K 4.7 08/01/2020   CL 106 08/01/2020   CO2 30 08/01/2020   Lab Results  Component Value Date   ALT 16 08/01/2020   AST 24 08/01/2020   ALKPHOS 85 08/01/2019   BILITOT 0.7 08/01/2020    Essential hypertension BP readings at home have been low, recommend decreasing dose of benazepril from 10 mg to 5 mg, 1/2 tablet. Continue monitoring BP, instructed to let me know about BP readings in 3 to 4 weeks. Continue low-salt diet. Eye exam every 1 to 2 years.  Hyperlipidemia Continue nonpharmacologic treatment. Further recommendation will be given according to lipid panel results.  Chronic pain disorder It seems to be stable. We discussed current recommendations in regard to chronic opioid/like opioid meds for pain management.  Continue tramadol 50 mg daily as needed, usually 30 tablets last over a year.  She will follow if pain gets worse.  Chronic bilateral low back pain with bilateral sciatica Stable. Continue tramadol 50 mg daily as needed and Zanaflex  2 to 4 mg twice daily as needed. Recommend avoiding activities that could aggravate pain. We discussed some side effects of  medications.  Osteopenia of right hip Fall precautions. Continue Ca++ and vit D supplementation. Low impact exercise.  Return in about 1 year (around 08/01/2021) for awv and f/u.   Jolaine Fryberger G. [name], MD  [facility]. [place] office.  Ms. Samarin , Thank you for taking time to come for your Medicare Wellness Visit. I appreciate your ongoing commitment to your health goals. Please review the following plan we discussed and let me know if I can assist you in the future.   These are the goals we discussed: Goals    . Prevent falls       This is a list of the screening recommended for you and due dates:  Health Maintenance  Topic Date Due  . Flu Shot  05/20/2020  . Tetanus Vaccine  09/22/2026  . DEXA scan (bone density measurement)  Completed  . COVID-19 Vaccine  Completed  . Pneumonia vaccines  Completed  . Colon Cancer Screening  Discontinued   A few tips:  -As we age balance is not as good as it was, so there is a higher risks for falls. Please remove small rugs and furniture that is "in your way" and could increase the risk of falls. Stretching exercises may help with fall prevention: Yoga and Tai Chi are some examples. Low impact exercise is better, so you are not very achy the next day.  -Sun screen and avoidance of direct sun light recommended. Caution with dehydration, if working outdoors be sure to drink enough fluids.  - Some medications are not safe as we age, increases the risk of side effects and can potentially interact with other medication you are also taken;  including some of over the counter medications. Be sure to let me know when you start a new medication even if it is a dietary/vitamin supplement.   -Healthy diet low in red meet/animal fat and sugar + regular physical activity is recommended.    A few things to remember from today's visit:   Hyperlipidemia, unspecified hyperlipidemia type - Plan: COMPLETE METABOLIC PANEL WITH GFR, Lipid  panel  Essential hypertension - Plan: COMPLETE METABOLIC PANEL WITH GFR  Chronic bilateral low back pain with bilateral sciatica - Plan: tiZANidine (ZANAFLEX) 4 MG tablet  Medicare annual wellness visit, subsequent  Because your blood pressure has ran low sometimes, decrease dose of Benazepril from 10 mg to 5 mg (1/2 tab) and continue monitoring blood pressure closely. Please let me know blood pressure readings in 3-4 weeks.  If you need refills please call your pharmacy. Do not use My Chart to request refills or for acute issues that need immediate attention.   Please be sure medication list is accurate. If a new problem present, please set up appointment sooner than planned today.

## 2020-08-01 NOTE — Assessment & Plan Note (Addendum)
Stable. Continue tramadol 50 mg daily as needed and Zanaflex 2 to 4 mg twice daily as needed. Recommend avoiding activities that could aggravate pain. We discussed some side effects of medications.

## 2020-08-01 NOTE — Patient Instructions (Addendum)
  Tricia Newman , Thank you for taking time to come for your Medicare Wellness Visit. I appreciate your ongoing commitment to your health goals. Please review the following plan we discussed and let me know if I can assist you in the future.   These are the goals we discussed: Goals    . Prevent falls       This is a list of the screening recommended for you and due dates:  Health Maintenance  Topic Date Due  . Flu Shot  05/20/2020  . Tetanus Vaccine  09/22/2026  . DEXA scan (bone density measurement)  Completed  . COVID-19 Vaccine  Completed  . Pneumonia vaccines  Completed  . Colon Cancer Screening  Discontinued   A few tips:  -As we age balance is not as good as it was, so there is a higher risks for falls. Please remove small rugs and furniture that is "in your way" and could increase the risk of falls. Stretching exercises may help with fall prevention: Yoga and Tai Chi are some examples. Low impact exercise is better, so you are not very achy the next day.  -Sun screen and avoidance of direct sun light recommended. Caution with dehydration, if working outdoors be sure to drink enough fluids.  - Some medications are not safe as we age, increases the risk of side effects and can potentially interact with other medication you are also taken;  including some of over the counter medications. Be sure to let me know when you start a new medication even if it is a dietary/vitamin supplement.   -Healthy diet low in red meet/animal fat and sugar + regular physical activity is recommended.    A few things to remember from today's visit:   Hyperlipidemia, unspecified hyperlipidemia type - Plan: COMPLETE METABOLIC PANEL WITH GFR, Lipid panel  Essential hypertension - Plan: COMPLETE METABOLIC PANEL WITH GFR  Chronic bilateral low back pain with bilateral sciatica - Plan: tiZANidine (ZANAFLEX) 4 MG tablet  Medicare annual wellness visit, subsequent  Because your blood pressure has ran  low sometimes, decrease dose of Benazepril from 10 mg to 5 mg (1/2 tab) and continue monitoring blood pressure closely. Please let me know blood pressure readings in 3-4 weeks.  If you need refills please call your pharmacy. Do not use My Chart to request refills or for acute issues that need immediate attention.   Please be sure medication list is accurate. If a new problem present, please set up appointment sooner than planned today.

## 2020-08-01 NOTE — Assessment & Plan Note (Signed)
BP readings at home have been low, recommend decreasing dose of benazepril from 10 mg to 5 mg, 1/2 tablet. Continue monitoring BP, instructed to let me know about BP readings in 3 to 4 weeks. Continue low-salt diet. Eye exam every 1 to 2 years.

## 2020-08-02 LAB — COMPLETE METABOLIC PANEL WITH GFR
AG Ratio: 1.6 (calc) (ref 1.0–2.5)
ALT: 16 U/L (ref 6–29)
AST: 24 U/L (ref 10–35)
Albumin: 4.4 g/dL (ref 3.6–5.1)
Alkaline phosphatase (APISO): 88 U/L (ref 37–153)
BUN/Creatinine Ratio: 30 (calc) — ABNORMAL HIGH (ref 6–22)
BUN: 17 mg/dL (ref 7–25)
CO2: 30 mmol/L (ref 20–32)
Calcium: 9.5 mg/dL (ref 8.6–10.4)
Chloride: 106 mmol/L (ref 98–110)
Creat: 0.57 mg/dL — ABNORMAL LOW (ref 0.60–0.88)
GFR, Est African American: 101 mL/min/{1.73_m2} (ref >=60)
GFR, Est Non African American: 88 mL/min/{1.73_m2} (ref >=60)
Globulin: 2.7 g/dL (calc) (ref 1.9–3.7)
Glucose, Bld: 102 mg/dL — ABNORMAL HIGH (ref 65–99)
Potassium: 4.7 mmol/L (ref 3.5–5.3)
Sodium: 142 mmol/L (ref 135–146)
Total Bilirubin: 0.7 mg/dL (ref 0.2–1.2)
Total Protein: 7.1 g/dL (ref 6.1–8.1)

## 2020-08-02 LAB — LIPID PANEL
Cholesterol: 193 mg/dL (ref <200)
HDL: 84 mg/dL (ref >=50)
LDL Cholesterol (Calc): 95 mg/dL (calc)
Non-HDL Cholesterol (Calc): 109 mg/dL (calc) (ref <130)
Total CHOL/HDL Ratio: 2.3 (calc) (ref <5.0)
Triglycerides: 62 mg/dL (ref <150)

## 2020-08-05 MED ORDER — TRAMADOL HCL 50 MG PO TABS: ORAL_TABLET | ORAL | 1 refills | Status: DC

## 2020-08-18 ENCOUNTER — Ambulatory Visit: Payer: Medicare Other | Attending: Internal Medicine

## 2020-08-18 DIAGNOSIS — Z23 Encounter for immunization: Secondary | ICD-10-CM

## 2020-08-18 NOTE — Progress Notes (Signed)
   Covid-19 Vaccination Clinic  Name:  Tricia Newman    MRN: [id number] DOB: 06-26-1941  08/18/2020  Tricia Newman was observed post Covid-19 immunization for 15 minutes without incident. She was provided with Vaccine Information Sheet and instruction to access the V-Safe system.   Tricia Newman was instructed to call 911 with any severe reactions post vaccine: Marland Kitchen Difficulty breathing  . Swelling of face and throat  . A fast heartbeat  . A bad rash all over body  . Dizziness and weakness

## 2020-10-05 ENCOUNTER — Emergency Department (HOSPITAL_COMMUNITY): Payer: Medicare Other

## 2020-10-05 ENCOUNTER — Inpatient Hospital Stay (HOSPITAL_COMMUNITY)
Admission: EM | Admit: 2020-10-05 | Discharge: 2020-10-07 | DRG: 065 | Disposition: A | Discharge status: Home Health | Payer: Medicare Other | Attending: Internal Medicine | Admitting: Internal Medicine

## 2020-10-05 ENCOUNTER — Other Ambulatory Visit: Payer: Self-pay

## 2020-10-05 DIAGNOSIS — E785 Hyperlipidemia, unspecified: Secondary | ICD-10-CM | POA: Diagnosis present

## 2020-10-05 DIAGNOSIS — M549 Dorsalgia, unspecified: Secondary | ICD-10-CM | POA: Diagnosis present

## 2020-10-05 DIAGNOSIS — R410 Disorientation, unspecified: Secondary | ICD-10-CM | POA: Diagnosis not present

## 2020-10-05 DIAGNOSIS — I63213 Cerebral infarction due to unspecified occlusion or stenosis of bilateral vertebral arteries: Secondary | ICD-10-CM | POA: Diagnosis not present

## 2020-10-05 DIAGNOSIS — I4891 Unspecified atrial fibrillation: Secondary | ICD-10-CM | POA: Diagnosis present

## 2020-10-05 DIAGNOSIS — Q283 Other malformations of cerebral vessels: Secondary | ICD-10-CM | POA: Diagnosis not present

## 2020-10-05 DIAGNOSIS — Z87891 Personal history of nicotine dependence: Secondary | ICD-10-CM | POA: Diagnosis not present

## 2020-10-05 DIAGNOSIS — I6389 Other cerebral infarction: Secondary | ICD-10-CM | POA: Diagnosis not present

## 2020-10-05 DIAGNOSIS — I639 Cerebral infarction, unspecified: Secondary | ICD-10-CM | POA: Diagnosis not present

## 2020-10-05 DIAGNOSIS — D696 Thrombocytopenia, unspecified: Secondary | ICD-10-CM | POA: Diagnosis not present

## 2020-10-05 DIAGNOSIS — Z20822 Contact with and (suspected) exposure to covid-19: Secondary | ICD-10-CM | POA: Diagnosis present

## 2020-10-05 DIAGNOSIS — I63533 Cerebral infarction due to unspecified occlusion or stenosis of bilateral posterior cerebral arteries: Secondary | ICD-10-CM | POA: Diagnosis not present

## 2020-10-05 DIAGNOSIS — I361 Nonrheumatic tricuspid (valve) insufficiency: Secondary | ICD-10-CM | POA: Diagnosis not present

## 2020-10-05 DIAGNOSIS — I4892 Unspecified atrial flutter: Secondary | ICD-10-CM | POA: Diagnosis present

## 2020-10-05 DIAGNOSIS — G8929 Other chronic pain: Secondary | ICD-10-CM | POA: Diagnosis present

## 2020-10-05 DIAGNOSIS — Z8249 Family history of ischemic heart disease and other diseases of the circulatory system: Secondary | ICD-10-CM | POA: Diagnosis not present

## 2020-10-05 DIAGNOSIS — R29700 NIHSS score 0: Secondary | ICD-10-CM | POA: Diagnosis present

## 2020-10-05 DIAGNOSIS — R4182 Altered mental status, unspecified: Secondary | ICD-10-CM | POA: Diagnosis not present

## 2020-10-05 DIAGNOSIS — G43909 Migraine, unspecified, not intractable, without status migrainosus: Secondary | ICD-10-CM | POA: Diagnosis not present

## 2020-10-05 DIAGNOSIS — I10 Essential (primary) hypertension: Secondary | ICD-10-CM | POA: Diagnosis not present

## 2020-10-05 DIAGNOSIS — M62838 Other muscle spasm: Secondary | ICD-10-CM | POA: Diagnosis present

## 2020-10-05 DIAGNOSIS — R531 Weakness: Secondary | ICD-10-CM | POA: Diagnosis not present

## 2020-10-05 DIAGNOSIS — R29818 Other symptoms and signs involving the nervous system: Secondary | ICD-10-CM | POA: Diagnosis not present

## 2020-10-05 DIAGNOSIS — R110 Nausea: Secondary | ICD-10-CM | POA: Diagnosis not present

## 2020-10-05 DIAGNOSIS — I483 Typical atrial flutter: Secondary | ICD-10-CM | POA: Diagnosis not present

## 2020-10-05 DIAGNOSIS — I63433 Cerebral infarction due to embolism of bilateral posterior cerebral arteries: Secondary | ICD-10-CM

## 2020-10-05 DIAGNOSIS — I6621 Occlusion and stenosis of right posterior cerebral artery: Secondary | ICD-10-CM | POA: Diagnosis not present

## 2020-10-05 DIAGNOSIS — I6782 Cerebral ischemia: Secondary | ICD-10-CM | POA: Diagnosis not present

## 2020-10-05 DIAGNOSIS — M810 Age-related osteoporosis without current pathological fracture: Secondary | ICD-10-CM | POA: Diagnosis not present

## 2020-10-05 DIAGNOSIS — R29810 Facial weakness: Secondary | ICD-10-CM | POA: Diagnosis not present

## 2020-10-05 LAB — HEMOGLOBIN A1C
Hgb A1c MFr Bld: 4.9 % (ref 4.8–5.6)
Mean Plasma Glucose: 93.93 mg/dL

## 2020-10-05 LAB — CBC WITH DIFFERENTIAL/PLATELET
Abs Immature Granulocytes: 0.01 10*3/uL (ref 0.00–0.07)
Basophils Absolute: 0 10*3/uL (ref 0.0–0.1)
Basophils Relative: 1 %
Eosinophils Absolute: 0.1 10*3/uL (ref 0.0–0.5)
Eosinophils Relative: 2 %
HCT: 38.1 % (ref 36.0–46.0)
Hemoglobin: 12.4 g/dL (ref 12.0–15.0)
Immature Granulocytes: 0 %
Lymphocytes Relative: 38 %
Lymphs Abs: 1.6 10*3/uL (ref 0.7–4.0)
MCH: 29.7 pg (ref 26.0–34.0)
MCHC: 32.5 g/dL (ref 30.0–36.0)
MCV: 91.1 fL (ref 80.0–100.0)
Monocytes Absolute: 0.2 10*3/uL (ref 0.1–1.0)
Monocytes Relative: 6 %
Neutro Abs: 2.3 10*3/uL (ref 1.7–7.7)
Neutrophils Relative %: 53 %
Platelets: 100 10*3/uL — ABNORMAL LOW (ref 150–400)
RBC: 4.18 MIL/uL (ref 3.87–5.11)
RDW: 14.1 % (ref 11.5–15.5)
WBC: 4.2 10*3/uL (ref 4.0–10.5)
nRBC: 0 % (ref 0.0–0.2)

## 2020-10-05 LAB — CBG MONITORING, ED: Glucose-Capillary: 99 mg/dL (ref 70–99)

## 2020-10-05 LAB — URINALYSIS, ROUTINE W REFLEX MICROSCOPIC
Bilirubin Urine: NEGATIVE
Glucose, UA: NEGATIVE mg/dL
Hgb urine dipstick: NEGATIVE
Ketones, ur: 5 mg/dL — AB
Leukocytes,Ua: NEGATIVE
Nitrite: NEGATIVE
Protein, ur: NEGATIVE mg/dL
Specific Gravity, Urine: 1.01 (ref 1.005–1.030)
pH: 7 (ref 5.0–8.0)

## 2020-10-05 LAB — LIPID PANEL
Cholesterol: 183 mg/dL (ref 0–200)
HDL: 71 mg/dL (ref >40)
LDL Cholesterol: 99 mg/dL (ref 0–99)
Total CHOL/HDL Ratio: 2.6 RATIO
Triglycerides: 67 mg/dL (ref <150)
VLDL: 13 mg/dL (ref 0–40)

## 2020-10-05 LAB — COMPREHENSIVE METABOLIC PANEL
ALT: 20 U/L (ref 0–44)
AST: 33 U/L (ref 15–41)
Albumin: 3.7 g/dL (ref 3.5–5.0)
Alkaline Phosphatase: 67 U/L (ref 38–126)
Anion gap: 11 (ref 5–15)
BUN: 15 mg/dL (ref 8–23)
CO2: 22 mmol/L (ref 22–32)
Calcium: 8.8 mg/dL — ABNORMAL LOW (ref 8.9–10.3)
Chloride: 105 mmol/L (ref 98–111)
Creatinine, Ser: 0.58 mg/dL (ref 0.44–1.00)
GFR, Estimated: >60 mL/min (ref >60)
Glucose, Bld: 99 mg/dL (ref 70–99)
Potassium: 4.2 mmol/L (ref 3.5–5.1)
Sodium: 138 mmol/L (ref 135–145)
Total Bilirubin: 0.9 mg/dL (ref 0.3–1.2)
Total Protein: 6.3 g/dL — ABNORMAL LOW (ref 6.5–8.1)

## 2020-10-05 LAB — RESP PANEL BY RT-PCR (FLU A&B, COVID) ARPGX2
Influenza A by PCR: NEGATIVE
Influenza B by PCR: NEGATIVE
SARS Coronavirus 2 by RT PCR: NEGATIVE

## 2020-10-05 LAB — LACTIC ACID, PLASMA: Lactic Acid, Venous: 1.8 mmol/L (ref 0.5–1.9)

## 2020-10-05 MED ORDER — LORATADINE 10 MG PO TABS
10.0000 mg | ORAL_TABLET | Freq: Every day | ORAL | Status: DC
  Administered 2020-10-06 – 2020-10-07 (×2): 10 mg via ORAL
  Filled 2020-10-05 (×2): qty 1

## 2020-10-05 MED ORDER — ENOXAPARIN SODIUM 40 MG/0.4ML ~~LOC~~ SOLN
40.0000 mg | SUBCUTANEOUS | Status: DC
  Administered 2020-10-06: 40 mg via SUBCUTANEOUS
  Filled 2020-10-05: qty 0.4

## 2020-10-05 MED ORDER — STROKE: EARLY STAGES OF RECOVERY BOOK: Freq: Once | Status: DC

## 2020-10-05 MED ORDER — CLOPIDOGREL BISULFATE 300 MG PO TABS
300.0000 mg | ORAL_TABLET | Freq: Once | ORAL | Status: AC
  Administered 2020-10-05: 300 mg via ORAL
  Filled 2020-10-05: qty 1

## 2020-10-05 MED ORDER — TRAMADOL HCL 50 MG PO TABS
50.0000 mg | ORAL_TABLET | Freq: Two times a day (BID) | ORAL | Status: DC | PRN
  Administered 2020-10-06: 50 mg via ORAL
  Filled 2020-10-05: qty 1

## 2020-10-05 MED ORDER — IOHEXOL 350 MG/ML SOLN
75.0000 mL | Freq: Once | INTRAVENOUS | Status: AC | PRN
  Administered 2020-10-05: 75 mL via INTRAVENOUS

## 2020-10-05 MED ORDER — CLOPIDOGREL BISULFATE 75 MG PO TABS
75.0000 mg | ORAL_TABLET | Freq: Every day | ORAL | Status: DC
  Administered 2020-10-06 – 2020-10-07 (×2): 75 mg via ORAL
  Filled 2020-10-05 (×2): qty 1

## 2020-10-05 MED ORDER — SENNOSIDES-DOCUSATE SODIUM 8.6-50 MG PO TABS: 1.0000 | ORAL_TABLET | Freq: Every evening | ORAL | Status: DC | PRN

## 2020-10-05 MED ORDER — SODIUM CHLORIDE 0.9 % IV SOLN: INTRAVENOUS | Status: DC

## 2020-10-05 MED ORDER — ACETAMINOPHEN 160 MG/5ML PO SOLN: 650.0000 mg | ORAL | Status: DC | PRN

## 2020-10-05 MED ORDER — TIZANIDINE HCL 4 MG PO TABS: 2.0000 mg | ORAL_TABLET | Freq: Two times a day (BID) | ORAL | Status: DC | PRN

## 2020-10-05 MED ORDER — HYDRALAZINE HCL 25 MG PO TABS: 25.0000 mg | ORAL_TABLET | Freq: Four times a day (QID) | ORAL | Status: DC | PRN

## 2020-10-05 MED ORDER — ACETAMINOPHEN 650 MG RE SUPP: 650.0000 mg | RECTAL | Status: DC | PRN

## 2020-10-05 MED ORDER — SODIUM CHLORIDE 0.9 % IV BOLUS
1000.0000 mL | Freq: Once | INTRAVENOUS | Status: AC
  Administered 2020-10-05: 1000 mL via INTRAVENOUS

## 2020-10-05 MED ORDER — ACETAMINOPHEN 325 MG PO TABS: 650.0000 mg | ORAL_TABLET | ORAL | Status: DC | PRN

## 2020-10-05 NOTE — ED Notes (Signed)
Pt passed the swallow screen 

## 2020-10-05 NOTE — ED Notes (Signed)
The pt continues to ask the same questions over and over  The sone has gone home for the night

## 2020-10-05 NOTE — ED Notes (Signed)
Code stroke called at 70 by dr rees  The pt was coming back from [facility]  Alert oriented skin warm and dry  The pt returfenexd from c-t 1700 the son was called and he is oln his way back here from outside

## 2020-10-05 NOTE — ED Notes (Signed)
Food given  She passed the swallow screen hours ago

## 2020-10-05 NOTE — Consult Note (Signed)
Neurology Consultation  Reason for Consult: Posterior circulation infarct on MRI brain Referring Physician: Dr. Ralene Bathe  CC: AMS  History is obtained from: patient, patient's son at bedside  HPI: Tricia Newman is a 79 y.o. female with a medical history significant for HTN, migraine headaches, and gastrointestinal bleeding (hematemesis) while on aspirin who presented to [facility] ED via EMS 12/17 for AMS. The most recent report of patient's baseline mental status was from her son who went to dinner with her 3 days ago at her baseline mental status. EMS was called today when neighbors found Tricia Newman sitting in the driveway confused and perseverating.   Son reports that at baseline she lives independently, manages her own finances, her medications, enjoys gardening and participates regularly with her preventative medical care  On ED arrival she was noted to be confused and MRI brain was obtained.  This revealed concern for bilateral posterior circulation strokes concerning for potential basilar thrombus.  Therefore code stroke was activated and patient was taken emergently for CTA, which cleared her basilar artery.  NIH stroke scale remained 0  LKW: 3 days ago (10/02/2020) tpa given?: no, outside of time window   ROS: Unable to assess. Patient is an unreliable source of information due to confusion.  At this time however she has no acute complaints and reports she feels fine  Past Medical History:  Diagnosis Date  . Allergic rhinitis   . Hypertension   . Migraine headache   . Osteoporosis   . PMS (premenstrual syndrome)   . Rosacea    Family History  Problem Relation Age of Onset  . Colon polyps Sister   . Colon polyps Brother   . Breast cancer Maternal Aunt   . Colon cancer Neg Hx   Mother: hypertension Son: hypertension Sister: stroke Father: hypertension  Social History:   reports that she quit smoking about 43 years ago. She has never used smokeless tobacco. She reports that she  does not drink alcohol and does not use drugs.  Medications Current Outpatient Medications  Medication Instructions  . benazepril (LOTENSIN) 10 MG tablet TAKE 1 TABLET DAILY  . Calcium Carbonate-Vitamin D (CALCIUM 600 + D PO) Daily  . fish oil-omega-3 fatty acids 2 g, 2 times daily  . loratadine (CLARITIN) 10 mg, Daily  . Multiple Vitamins-Minerals (CENTRUM SILVER PO) Daily  . tiZANidine (ZANAFLEX) 2-4 mg, Oral, Every 12 hours PRN, Please establish with a new provider for further refills  . traMADol (ULTRAM) 50 MG tablet TAKE 1 TABLET BY MOUTH AT BEDTIME AS NEEDED FOR PAIN   Assessment: Exam: Current vital signs: BP (!) 157/67   Pulse 88   Temp 97.6 F (36.4 C) (Oral)   Resp 18   Ht 5\' 1"  (1.549 m)   Wt 64 kg   SpO2 98%   BMI 26.66 kg/m  Vital signs in last 24 hours: Temp:  [97.6 F (36.4 C)] 97.6 F (36.4 C) (12/17 1109) Pulse Rate:  [72-95] 88 (12/17 1700) Resp:  [13-25] 18 (12/17 1700) BP: (142-190)/(67-88) 157/67 (12/17 1700) SpO2:  [92 %-100 %] 98 % (12/17 1700) Weight:  [64 kg] 64 kg (12/17 1112)  GENERAL: Awake, alert, confused. Able to correctly state name and age on assessment.  HEENT: - Normocephalic and atraumatic, dry mm. LUNGS - Normal respiratory effort. Symmetric chest rise with inspiration CV - Regular rate on cardiac monitor.  ABDOMEN - Soft, nontender Ext: warm, well perfused  NEURO:  Mental Status: alert and oriented to self and age.  Amnestic to recent history and events leading up to hospitalization. Has trouble with short term memory, perseverates frequently.  Speech/Language: speech is intact without slurring or dysarthria.  Naming, repetition, fluency, and comprehension intact. Cranial Nerves:  II: PERRL 2 --> 1 mm/brisk. Visual fields full.  III, IV, VI: EOMI. Lid elevation symmetric and full.  V: Sensation is intact and symmetrical to face. Blinks to threat. Moves jaw back and forth.  VII: Smile is symmetrical. Able to puff cheeks and raise  eyebrows.  VIII: Hearing intact to voice IX, X: Palate elevation is symmetric. Phonation normal.  XI: Normal sternocleidomastoid and trapezius muscle strength XII: Tongue is symmetrical without fasciculations.   Motor: 5/5 strength is all muscle groups.  Tone is normal. Bulk is normal.  Sensation- Intact to light touch bilaterally in all four extremities. Coordination: FTN intact bilaterally. HKS intact bilaterally. No pronator drift.  Gait- deferred  1a Level of Conscious.: 0 1b LOC Questions: 0 1c LOC Commands: 0 2 Best Gaze: 0 3 Visual: 0 4 Facial Palsy: 0 5a Motor Arm - left: 0 5b Motor Arm - Right: 0 6a Motor Leg - Left: 0 6b Motor Leg - Right: 0 7 Limb Ataxia: 0 8 Sensory: 0 9 Best Language: 0 10 Dysarthria: 0 11 Extinct. and Inatten.: 0 TOTAL:  0  Labs I have reviewed labs in epic and the results pertinent to this consultation are: Platelet count of 100, new since 2018 (179 at the time) Normal creatinine 0.58  Imaging I have reviewed the images obtained:  CT-scan of the brain IMPRESSION: Chronic atrophic and ischemic changes without acute abnormality.  MRI examination of the brain IMPRESSION: Patchy areas of restricted diffusion in the thalamus bilaterally left greater than right, and in the tail the hippocampus on the right. Midbrain spared. This is consistent with posterior circulation infarct. Consider distal basilar thrombus or artery of [place] incomplete infarction. CT angio head and neck recommended for further evaluation.  Chronic microvascular ischemic change in the white matter.  Following results of MRI, a Code Stroke was activated and CT angio head and neck were obtained: IMPRESSION: CTA neck:  1. The bilateral common and internal carotid arteries are patent within the neck without stenosis or significant atherosclerotic disease. 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 2. Vertebral arteries patent within the neck without stenosis.  The left vertebral artery is significantly dominant. The right vertebral artery is developmentally diminutive.  CTA head: 1. Diminutive appearance of the non-dominant intracranial right vertebral artery beyond the right PICA origin, and of the proximal right PICA. Findings are indeterminate in etiology and could be secondary to developmentally small vessel size or atherosclerotic stenosis. Dissection is difficult to definitively exclude, but this would be an atypical location. 2. 6 mm flow gap within the P2 left posterior cerebral artery. This may reflect occlusion with distal reconstitution or severe stenosis. 3. Severe right PCA stenosis at the P2/P3 junction.  Impression: Tricia Newman is a 79 year old female with HTN, migraine headaches, and a GIB with use of Aspirin (patient reports this was over 3 years ago). At baseline, she is able to live independently and manage her own medications but today she presented today with acute confusion. She was last known well 3 days ago when she went to dinner with her son. MRI revealed patchy areas of restricted diffusion of the thalamus bilaterally with left diffusion changes greater than right. She was activated as a code stroke and a CTA head and neck were obtained revealing 54mm flow  gap within the P2 left posterior cerebral artery and severe right PCA stenosis. Due to last known well being 3 days ago and no LVO identified, no immediate interventions are appropriate.   As patient is reluctant to take aspirin, and there is some weak data that Plavix may be associated with less GI bleeding, we will start with Plavix monotherapy at this time  Recommendations:  #Posterior circulation strokes, atheroembolic from potential right vertebral atherosclerosis versus cardioembolic - CBUL8G, fasting lipid panel - Frequent neuro checks - Echocardiogram - Prophylactic therapy-clopidogrel 300 mg once then clopidogrel 75mg  daily due to patient intolerance of aspirin  in the past (self-reported hematemesis)  -If patient is amenable to aspirin once she is less confused as she recovers from her stroke, consider dual antiplatelet therapy for 21 to 90 days followed by  monotherapy lifelong - Given concern for stenoses on vessel imaging, permissive hypertension overnight up to 200/100 - Risk factor modification - Telemetry monitoring.  Given high concern for embolic appearance, 53-MIW event monitor on discharge if no arrhythmias captured inpatient - PT consult, OT consult, Speech consult - Stroke team to follow  Pt seen by NP/Neuro and later by MD. Note/plan to be edited by MD as needed.  Anibal Henderson, AGAC-NP [facility] Pager: [phone]  Lesleigh Noe MD-PhD [facility] [phone]

## 2020-10-05 NOTE — ED Notes (Signed)
Urine culture sent down w/ UA

## 2020-10-05 NOTE — ED Notes (Signed)
Patient transported to MRI 

## 2020-10-05 NOTE — ED Notes (Signed)
The son has returned the doctor has spoken to the son

## 2020-10-05 NOTE — H&P (Signed)
History and Physical    Tricia Newman IZT:[id number] DOB: [birth year] DOA: 10/05/2020  PCP: [name], [name], MD (Confirm with patient/family/NH records and if not entered, this has to be entered at [facility] point of entry) Patient coming from: Home  I have personally briefly reviewed patient's old medical records in [place]  Chief Complaint: I don't remember much  HPI: Tricia Newman is a 79 y.o. female with medical history significant of HTN, migraines, chronic back pain, presented with change in mental status.  Patient son reported patient baseline has no dementia and patient lives by herself able to take care of self.  This morning, patient was noted sitting in the hallway very confused not remember why she was there and her gait was very verbally.  Patient was very confused at time I interviewed her, kept repeating "How come I do not remember anything, I can not remember my doctor's name!"  Patient does remember she takes her BP meds once a day in the evening.  She denied any headache, no numbness or weakness of her limbs. ED Course: MRI concern about bilateral thalamus acute infarct.  CT angiogram showed narrowing of PICA and left P2 branch of PCA and severe right PCA stenosis.  Review of Systems: Unable to perform patient confused  Past Medical History:  Diagnosis Date  . Allergic rhinitis   . Hypertension   . Migraine headache   . Osteoporosis   . PMS (premenstrual syndrome)   . Rosacea     Past Surgical History:  Procedure Laterality Date  . childbirthx2    . COLONOSCOPY  06/2003     reports that she quit smoking about 43 years ago. She has never used smokeless tobacco. She reports that she does not drink alcohol and does not use drugs.  No Known Allergies  Family History  Problem Relation Age of Onset  . Colon polyps Sister   . Colon polyps Brother   . Breast cancer Maternal Aunt   . Colon cancer Neg Hx      Prior to Admission medications   Medication  Sig Start Date End Date Taking? Authorizing Provider  benazepril (LOTENSIN) 10 MG tablet TAKE 1 TABLET DAILY 07/16/20   [name], [name], MD  Calcium Carbonate-Vitamin D (CALCIUM 600 + D PO) Take by mouth daily.      [provider]  fish oil-omega-3 fatty acids 1000 MG capsule Take 2 g by mouth 2 (two) times daily.      [provider]  loratadine (CLARITIN) 10 MG tablet Take 10 mg by mouth daily.      [provider]  Multiple Vitamins-Minerals (CENTRUM SILVER PO) Take by mouth daily.      [provider]  tiZANidine (ZANAFLEX) 4 MG tablet Take 0.5-1 tablets (2-4 mg total) by mouth every 12 (twelve) hours as needed for muscle spasms. Please establish with a new provider for further refills 08/01/20   [name], [name], MD  traMADol (ULTRAM) 50 MG tablet TAKE 1 TABLET BY MOUTH AT BEDTIME AS NEEDED FOR PAIN 08/05/20   [name], [name], MD    Physical Exam: Vitals:   10/05/20 1745 10/05/20 1800 10/05/20 1815 10/05/20 1830  BP: (!) 141/75 (!) 147/58 134/76 138/79  Pulse: 84 85 85 71  Resp: 20 16 18 15   Temp:      TempSrc:      SpO2: 97% 97% 98% 98%  Weight:      Height:  Constitutional: NAD, calm, comfortable Vitals:   10/05/20 1745 10/05/20 1800 10/05/20 1815 10/05/20 1830  BP: (!) 141/75 (!) 147/58 134/76 138/79  Pulse: 84 85 85 71  Resp: 20 16 18 15   Temp:      TempSrc:      SpO2: 97% 97% 98% 98%  Weight:      Height:       Eyes: PERRL, lids and conjunctivae normal ENMT: Mucous membranes are moist. Posterior pharynx clear of any exudate or lesions.Normal dentition.  Neck: normal, supple, no masses, no thyromegaly Respiratory: clear to auscultation bilaterally, no wheezing, no crackles. Normal respiratory effort. No accessory muscle use.  Cardiovascular: Regular rate and rhythm, no murmurs / rubs / gallops. No extremity edema. 2+ pedal pulses. No carotid bruits.  Abdomen: no tenderness, no masses palpated. No hepatosplenomegaly. Bowel  sounds positive.  Musculoskeletal: no clubbing / cyanosis. No joint deformity upper and lower extremities. Good ROM, no contractures. Normal muscle tone.  Skin: no rashes, lesions, ulcers. No induration Neurologic: CN 2-12 grossly intact. Sensation intact, DTR normal. Strength 5/5 in all 4.  Coordination sluggish on bilateral arms Psychiatric: Oriented to herself confused about time and place    Labs on Admission: I have personally reviewed following labs and imaging studies  CBC: Recent Labs  Lab 10/05/20 1117  WBC 4.2  NEUTROABS 2.3  HGB 12.4  HCT 38.1  MCV 91.1  PLT 443*   Basic Metabolic Panel: Recent Labs  Lab 10/05/20 1117  NA 138  K 4.2  CL 105  CO2 22  GLUCOSE 99  BUN 15  CREATININE 0.58  CALCIUM 8.8*   GFR: Estimated Creatinine Clearance: 48.1 mL/min (by C-G formula based on SCr of 0.58 mg/dL). Liver Function Tests: Recent Labs  Lab 10/05/20 1117  AST 33  ALT 20  ALKPHOS 67  BILITOT 0.9  PROT 6.3*  ALBUMIN 3.7   No results for input(s): LIPASE, AMYLASE in the last 168 hours. No results for input(s): AMMONIA in the last 168 hours. Coagulation Profile: No results for input(s): INR, PROTIME in the last 168 hours. Cardiac Enzymes: No results for input(s): CKTOTAL, CKMB, CKMBINDEX, TROPONINI in the last 168 hours. BNP (last 3 results) No results for input(s): PROBNP in the last 8760 hours. HbA1C: No results for input(s): HGBA1C in the last 72 hours. CBG: Recent Labs  Lab 10/05/20 1116  GLUCAP 99   Lipid Profile: No results for input(s): CHOL, HDL, LDLCALC, TRIG, CHOLHDL, LDLDIRECT in the last 72 hours. Thyroid Function Tests: No results for input(s): TSH, T4TOTAL, FREET4, T3FREE, THYROIDAB in the last 72 hours. Anemia Panel: No results for input(s): VITAMINB12, FOLATE, FERRITIN, TIBC, IRON, RETICCTPCT in the last 72 hours. Urine analysis:    Component Value Date/Time   COLORURINE STRAW (A) 10/05/2020 1407   APPEARANCEUR CLEAR 10/05/2020  1407   LABSPEC 1.010 10/05/2020 1407   PHURINE 7.0 10/05/2020 1407   GLUCOSEU NEGATIVE 10/05/2020 1407   HGBUR NEGATIVE 10/05/2020 1407   HGBUR negative 06/27/2010 0000   BILIRUBINUR NEGATIVE 10/05/2020 1407   BILIRUBINUR n 10/06/2017 1151   KETONESUR 5 (A) 10/05/2020 1407   PROTEINUR NEGATIVE 10/05/2020 1407   UROBILINOGEN 0.2 10/06/2017 1151   UROBILINOGEN 0.2 03/16/2011 0941   NITRITE NEGATIVE 10/05/2020 1407   LEUKOCYTESUR NEGATIVE 10/05/2020 1407    Radiological Exams on Admission: CT Angio Head W or Wo Contrast  Result Date: 10/05/2020 CLINICAL DATA:  Neuro deficit, acute, stroke suspected. EXAM: CT ANGIOGRAPHY HEAD AND NECK TECHNIQUE: Multidetector CT imaging of the  head and neck was performed using the standard protocol during bolus administration of intravenous contrast. Multiplanar CT image reconstructions and MIPs were obtained to evaluate the vascular anatomy. Carotid stenosis measurements (when applicable) are obtained utilizing NASCET criteria, using the distal internal carotid diameter as the denominator. CONTRAST:  76mL OMNIPAQUE IOHEXOL 350 MG/ML SOLN COMPARISON:  Brain MRI performed earlier today 10/05/2020. Head CT 10/05/2020. FINDINGS: CTA NECK FINDINGS Aortic arch: Standard aortic branching. Atherosclerotic plaque within the visualized aortic arch and proximal major branch vessels of the neck. No hemodynamically significant innominate or proximal subclavian artery stenosis. Right carotid system: CCA and ICA patent within the neck without stenosis. No significant atherosclerotic disease. Carotid web within the right carotid bulb (series 9, image 60). Left carotid system: CCA and ICA patent within the neck without stenosis. No significant atherosclerotic disease. Vertebral arteries: Patent within the neck without stenosis. The left vertebral artery is dominant. The right vertebral artery is developmentally diminutive. Skeleton: No acute bony abnormality or aggressive osseous  lesion. Reversal of the expected cervical lordosis. C3-C4 and C4-C5 grade 1 anterolisthesis. Other neck: No neck mass or cervical lymphadenopathy. Subcentimeter thyroid nodules not meeting consensus criteria for ultrasound follow-up. Upper chest: No consolidation within the imaged lung apices. Review of the MIP images confirms the above findings CTA HEAD FINDINGS Anterior circulation: The intracranial internal carotid arteries are patent. Calcified plaque within both vessels with no more than mild stenosis. The M1 middle cerebral arteries are patent. No M2 proximal branch occlusion or high-grade proximal stenosis is identified. The anterior cerebral arteries are patent. No intracranial aneurysm is identified. Posterior circulation: Diminutive appearance of the intracranial right vertebral artery beyond the right PICA origin, and of the proximal right PICA. The dominant intracranial left vertebral artery is patent without stenosis, as is the basilar artery. Apparent 6 mm flow gap within the P2 left posterior cerebral artery. (Series 10, image 21). Severe focal stenosis within the right PCA at the P2/P3 junction (series 10, image 22) (series 12, image 15). Posterior communicating arteries are hypoplastic or absent bilaterally. Venous sinuses: Within the limitations of contrast timing, no convincing thrombus. Anatomic variants: As described Review of the MIP images confirms the above findings These results were called by telephone at the time of interpretation on 10/05/2020 at 5:1o pm to provider Dr. Curly Shores, who verbally acknowledged these results. IMPRESSION: CTA neck: 1. The bilateral common and internal carotid arteries are patent within the neck without stenosis or significant atherosclerotic disease. 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 2. Vertebral arteries patent within the neck without stenosis. The left vertebral artery is significantly dominant. The right vertebral artery is developmentally diminutive. CTA  head: 1. Diminutive appearance of the non-dominant intracranial right vertebral artery beyond the right PICA origin, and of the proximal right PICA. Findings are indeterminate in etiology and could be secondary to developmentally small vessel size or atherosclerotic stenosis. Dissection is difficult to definitively exclude, but this would be an atypical location. 2. 6 mm flow gap within the P2 left posterior cerebral artery. This may reflect occlusion with distal reconstitution or severe stenosis. 3. Severe right PCA stenosis at the P2/P3 junction. Electronically Signed   By: Kellie Simmering DO   On: 10/05/2020 17:39   DG Chest 2 View  Result Date: 10/05/2020 CLINICAL DATA:  Altered mental status and nausea EXAM: CHEST - 2 VIEW COMPARISON:  None. FINDINGS: Cardiac shadow is mildly enlarged in size but accentuated by the frontal technique. Aortic calcifications are noted. The lungs are clear.  Mild degenerative changes of the thoracolumbar spine are noted. IMPRESSION: No acute abnormality noted. Electronically Signed   By: Inez Catalina M.D.   On: 10/05/2020 11:42   CT Head Wo Contrast  Result Date: 10/05/2020 CLINICAL DATA:  Altered mental status EXAM: CT HEAD WITHOUT CONTRAST TECHNIQUE: Contiguous axial images were obtained from the base of the skull through the vertex without intravenous contrast. COMPARISON:  None. FINDINGS: Brain: No evidence of acute infarction, hemorrhage, hydrocephalus, extra-axial collection or mass lesion/mass effect. Mild atrophic and chronic white matter ischemic changes are noted. Vascular: No hyperdense vessel or unexpected calcification. Skull: Normal. Negative for fracture or focal lesion. Sinuses/Orbits: No acute finding. Other: None. IMPRESSION: Chronic atrophic and ischemic changes without acute abnormality. Electronically Signed   By: Inez Catalina M.D.   On: 10/05/2020 12:37   CT Angio Neck W and/or Wo Contrast  Result Date: 10/05/2020 CLINICAL DATA:  Neuro deficit,  acute, stroke suspected. EXAM: CT ANGIOGRAPHY HEAD AND NECK TECHNIQUE: Multidetector CT imaging of the head and neck was performed using the standard protocol during bolus administration of intravenous contrast. Multiplanar CT image reconstructions and MIPs were obtained to evaluate the vascular anatomy. Carotid stenosis measurements (when applicable) are obtained utilizing NASCET criteria, using the distal internal carotid diameter as the denominator. CONTRAST:  12mL OMNIPAQUE IOHEXOL 350 MG/ML SOLN COMPARISON:  Brain MRI performed earlier today 10/05/2020. Head CT 10/05/2020. FINDINGS: CTA NECK FINDINGS Aortic arch: Standard aortic branching. Atherosclerotic plaque within the visualized aortic arch and proximal major branch vessels of the neck. No hemodynamically significant innominate or proximal subclavian artery stenosis. Right carotid system: CCA and ICA patent within the neck without stenosis. No significant atherosclerotic disease. Carotid web within the right carotid bulb (series 9, image 60). Left carotid system: CCA and ICA patent within the neck without stenosis. No significant atherosclerotic disease. Vertebral arteries: Patent within the neck without stenosis. The left vertebral artery is dominant. The right vertebral artery is developmentally diminutive. Skeleton: No acute bony abnormality or aggressive osseous lesion. Reversal of the expected cervical lordosis. C3-C4 and C4-C5 grade 1 anterolisthesis. Other neck: No neck mass or cervical lymphadenopathy. Subcentimeter thyroid nodules not meeting consensus criteria for ultrasound follow-up. Upper chest: No consolidation within the imaged lung apices. Review of the MIP images confirms the above findings CTA HEAD FINDINGS Anterior circulation: The intracranial internal carotid arteries are patent. Calcified plaque within both vessels with no more than mild stenosis. The M1 middle cerebral arteries are patent. No M2 proximal branch occlusion or  high-grade proximal stenosis is identified. The anterior cerebral arteries are patent. No intracranial aneurysm is identified. Posterior circulation: Diminutive appearance of the intracranial right vertebral artery beyond the right PICA origin, and of the proximal right PICA. The dominant intracranial left vertebral artery is patent without stenosis, as is the basilar artery. Apparent 6 mm flow gap within the P2 left posterior cerebral artery. (Series 10, image 21). Severe focal stenosis within the right PCA at the P2/P3 junction (series 10, image 22) (series 12, image 15). Posterior communicating arteries are hypoplastic or absent bilaterally. Venous sinuses: Within the limitations of contrast timing, no convincing thrombus. Anatomic variants: As described Review of the MIP images confirms the above findings These results were called by telephone at the time of interpretation on 10/05/2020 at 5:1o pm to provider Dr. Curly Shores, who verbally acknowledged these results. IMPRESSION: CTA neck: 1. The bilateral common and internal carotid arteries are patent within the neck without stenosis or significant atherosclerotic disease. Carotid web within the  right ICA bulb. 2. Vertebral arteries patent within the neck without stenosis. The left vertebral artery is significantly dominant. The right vertebral artery is developmentally diminutive. CTA head: 1. Diminutive appearance of the non-dominant intracranial right vertebral artery beyond the right PICA origin, and of the proximal right PICA. Findings are indeterminate in etiology and could be secondary to developmentally small vessel size or atherosclerotic stenosis. Dissection is difficult to definitively exclude, but this would be an atypical location. 2. 6 mm flow gap within the P2 left posterior cerebral artery. This may reflect occlusion with distal reconstitution or severe stenosis. 3. Severe right PCA stenosis at the P2/P3 junction. Electronically Signed   By: Kellie Simmering DO   On: 10/05/2020 17:39   MR BRAIN WO CONTRAST  Result Date: 10/05/2020 CLINICAL DATA:  Acute neuro deficit EXAM: MRI HEAD WITHOUT CONTRAST TECHNIQUE: Multiplanar, multiecho pulse sequences of the brain and surrounding structures were obtained without intravenous contrast. COMPARISON:  CT head 10/05/2020 FINDINGS: Brain: Restricted diffusion in the tail of the hippocampus on the right. Small areas of restricted diffusion in the thalamus bilaterally most notably in the left posterior thalamus. Midbrain spared. No supratentorial acute infarct. Generalized atrophy. Patchy deep white matter hyperintensities bilaterally, mild-to-moderate in degree. Negative for hemorrhage or mass. Vascular: Normal arterial flow voids Skull and upper cervical spine: No focal skeletal lesion. Sinuses/Orbits: Paranasal sinuses clear. Bilateral cataract extraction Other: None IMPRESSION: Patchy areas of restricted diffusion in the thalamus bilaterally left greater than right, and in the tail the hippocampus on the right. Midbrain spared. This is consistent with posterior circulation infarct. Consider distal basilar thrombus or artery of [place] incomplete infarction. CT angio head and neck recommended for further evaluation. Chronic microvascular ischemic change in the white matter. Electronically Signed   By: Franchot Gallo M.D.   On: 10/05/2020 16:18    EKG: Independently reviewed.  Sinus rhythm, no PR or QTC.  all Assessment/Plan Active Problems:   CVA (cerebral vascular accident) ([place])  (please populate well all problems here in Problem List. (For example, if patient is on BP meds at home and you resume or decide to hold them, it is a problem that needs to be her. Same for CAD, COPD, HLD and so on)  CVA with bilateral thalamus infarct -CTA revealed multiple foci mitral stenosis, neurology reviewed the case, concluded not candidate for IR intervention.  Will check A1c and lipid panel, for risk factor  modification. -Symptoms involve mentation changes in the probably also ataxia compatible with posterior circulation impairment. -Echo, PT OT evaluation -Asa and Plavix regimen as per neurology  HTN -Allow permissive hypertension -As needed hydralazine for now    DVT prophylaxis: Lovenox  code Status: Full code Family Communication: Son at bedside Disposition Plan: Expect more than 2 midnight hospital stay for stroke work-up and PT evaluation Consults called: Neurology Admission status: Telemetry admission   Lequita Halt MD [facility] Pager [phone]  10/05/2020, 7:21 PM

## 2020-10-05 NOTE — Code Documentation (Signed)
Stroke Response Nurse Documentation Code Documentation  Tricia Newman is a 79 y.o. female arriving to [place]. [facility] ED via EMS on 10/05/2020 with past medical hx of HTN, migraine headaches. Code stroke was activated by ED MD. Patient from home where she was LKW at [place] and now complaining of confusion. Pt states she woke up this morning and got ready to do some outside work. She last remembers getting ready and walking outside her house. This morning EMS was called because she was found sitting in the driveway by a neighbor. On No antithrombotic. Stroke team at the bedside on patient arrival. Labs drawn and patient cleared for CT by Dr. Ralene Bathe. Patient to CT. NIHSS 1, see documentation for details and code stroke times. Patient with disorientation to age on exam. The following imaging was completed: CT, CTA head and neck. Patient is not a candidate for tPA due to LKW >4.5 hours.   Care/Plan: q2h NIHSS and VS Bedside handoff with ED RN C. [place]  Rapid Response RN

## 2020-10-05 NOTE — ED Notes (Signed)
covid swab results not found  Sending another one to lab

## 2020-10-05 NOTE — ED Provider Notes (Signed)
[facility] EMERGENCY DEPARTMENT Provider Note   CSN: [id number] Arrival date & time: 10/05/20  1106  An emergency department physician performed an initial assessment on this suspected stroke patient at 1635.  History Chief Complaint  Patient presents with  . Altered Mental Status    Tricia Newman is a 79 y.o. female.  Pt presents to the ED today with AMS.  Pt lives by herself and normally does well.  She was found this am sitting outside her house in the driveway.  The pt does not know why she was there.  It is unclear how long she was there, but she does not feel cold.  LSN was about 3 days ago when she spoke to her son.  Son said she texted him this am around 0830 and it was a normal text. Pt given 4 mg zofran en route due to nausea.  She is perseverating and keeps asking what happened.  No obvious head trauma.  Speech is fluent and she is moving all 4 extremities.        Past Medical History:  Diagnosis Date  . Allergic rhinitis   . Hypertension   . Migraine headache   . Osteoporosis   . PMS (premenstrual syndrome)   . Rosacea     Patient Active Problem List   Diagnosis Date Noted  . CVA (cerebral vascular accident) ([place]) 10/05/2020  . Chronic bilateral low back pain with bilateral sciatica 08/01/2019  . Chronic pain disorder 10/06/2018  . Hyperlipidemia 10/06/2018  . Back pain, chronic 08/02/2012  . SEBACEOUS GLAND DISORDER 07/08/2010  . Essential hypertension 07/21/2008  . ACNE ROSACEA 06/24/2007  . Allergic rhinitis 05/26/2007  . Osteopenia of hip 05/26/2007    Past Surgical History:  Procedure Laterality Date  . childbirthx2    . COLONOSCOPY  06/2003     OB History   No obstetric history on file.     Family History  Problem Relation Age of Onset  . Colon polyps Sister   . Colon polyps Brother   . Breast cancer Maternal Aunt   . Colon cancer Neg Hx     Social History   Tobacco Use  . Smoking status: Former Smoker     Quit date: 10/20/1976    Years since quitting: 43.9  . Smokeless tobacco: Never Used  Substance Use Topics  . Alcohol use: No  . Drug use: No    Home Medications Prior to Admission medications   Medication Sig Start Date End Date Taking? Authorizing Provider  benazepril (LOTENSIN) 10 MG tablet TAKE 1 TABLET DAILY 07/16/20  Yes [name], [name], MD  Calcium Carbonate-Vitamin D (CALCIUM 600 + D PO) Take 1 tablet by mouth daily.   Yes [provider]  fish oil-omega-3 fatty acids 1000 MG capsule Take 2 g by mouth 2 (two) times daily.   Yes [provider]  loratadine (CLARITIN) 10 MG tablet Take 10 mg by mouth daily.   Yes [provider]  Multiple Vitamins-Minerals (CENTRUM SILVER PO) Take 1 tablet by mouth daily.   Yes [provider]  tiZANidine (ZANAFLEX) 4 MG tablet Take 0.5-1 tablets (2-4 mg total) by mouth every 12 (twelve) hours as needed for muscle spasms. Please establish with a new provider for further refills 08/01/20  Yes [name], [name], MD  traMADol (ULTRAM) 50 MG tablet TAKE 1 TABLET BY MOUTH AT BEDTIME AS NEEDED FOR PAIN 08/05/20  Yes [name], [name], MD    Allergies  Patient has no known allergies.  Review of Systems   Review of Systems  All other systems reviewed and are negative.   Physical Exam Updated Vital Signs BP (!) 154/78   Pulse 77   Temp 98.2 F (36.8 C) (Oral)   Resp 17   Ht 5\' 1"  (1.549 m)   Wt 64 kg   SpO2 99%   BMI 26.66 kg/m   Physical Exam Vitals and nursing note reviewed.  Constitutional:      Appearance: Normal appearance.  HENT:     Head: Normocephalic and atraumatic.     Right Ear: External ear normal.     Left Ear: External ear normal.     Nose: Nose normal.     Mouth/Throat:     Mouth: Mucous membranes are moist.     Pharynx: Oropharynx is clear.  Eyes:     Extraocular Movements: Extraocular movements intact.     Conjunctiva/sclera: Conjunctivae normal.     Pupils: Pupils are equal, round,  and reactive to light.  Cardiovascular:     Rate and Rhythm: Normal rate and regular rhythm.     Pulses: Normal pulses.     Heart sounds: Normal heart sounds.  Pulmonary:     Effort: Pulmonary effort is normal.     Breath sounds: Normal breath sounds.  Abdominal:     General: Abdomen is flat. Bowel sounds are normal.     Palpations: Abdomen is soft.  Musculoskeletal:     Cervical back: Normal range of motion and neck supple.     Comments: Small abrasions to both knees  Skin:    General: Skin is warm.     Capillary Refill: Capillary refill takes less than 2 seconds.  Neurological:     Mental Status: She is alert. She is disoriented.     Comments: Pt knows her name, her DOB.  She knows she lives in [place].  She does not know the date.  She is moving all 4 extremities and is following commands.  Speech is fluent.  She keeps asking repetitive questions.       ED Results / Procedures / Treatments   Labs (all labs ordered are listed, but only abnormal results are displayed) Labs Reviewed  CBC WITH DIFFERENTIAL/PLATELET - Abnormal; Notable for the following components:      Result Value   Platelets 100 (*)    All other components within normal limits  COMPREHENSIVE METABOLIC PANEL - Abnormal; Notable for the following components:   Calcium 8.8 (*)    Total Protein 6.3 (*)    All other components within normal limits  URINALYSIS, ROUTINE W REFLEX MICROSCOPIC - Abnormal; Notable for the following components:   Color, Urine STRAW (*)    Ketones, ur 5 (*)    All other components within normal limits  RESP PANEL BY RT-PCR (FLU A&B, COVID) ARPGX2  LACTIC ACID, PLASMA  HEMOGLOBIN A1C  LIPID PANEL  CBG MONITORING, ED    EKG EKG Interpretation  Date/Time:  Friday October 05 2020 11:10:22 EST Ventricular Rate:  76 PR Interval:    QRS Duration: 81 QT Interval:  413 QTC Calculation: 465 R Axis:   87 Text Interpretation: Poor data quality Borderline right axis deviation ST  elevation, consider inferior injury No old tracing to compare Confirmed by Isla Pence [phone]) on 10/05/2020 3:02:39 PM   Radiology CT Angio Head W or Wo Contrast  Result Date: 10/05/2020 CLINICAL DATA:  Neuro deficit, acute, stroke suspected. EXAM: CT ANGIOGRAPHY HEAD  AND NECK TECHNIQUE: Multidetector CT imaging of the head and neck was performed using the standard protocol during bolus administration of intravenous contrast. Multiplanar CT image reconstructions and MIPs were obtained to evaluate the vascular anatomy. Carotid stenosis measurements (when applicable) are obtained utilizing NASCET criteria, using the distal internal carotid diameter as the denominator. CONTRAST:  57mL OMNIPAQUE IOHEXOL 350 MG/ML SOLN COMPARISON:  Brain MRI performed earlier today 10/05/2020. Head CT 10/05/2020. FINDINGS: CTA NECK FINDINGS Aortic arch: Standard aortic branching. Atherosclerotic plaque within the visualized aortic arch and proximal major branch vessels of the neck. No hemodynamically significant innominate or proximal subclavian artery stenosis. Right carotid system: CCA and ICA patent within the neck without stenosis. No significant atherosclerotic disease. Carotid web within the right carotid bulb (series 9, image 60). Left carotid system: CCA and ICA patent within the neck without stenosis. No significant atherosclerotic disease. Vertebral arteries: Patent within the neck without stenosis. The left vertebral artery is dominant. The right vertebral artery is developmentally diminutive. Skeleton: No acute bony abnormality or aggressive osseous lesion. Reversal of the expected cervical lordosis. C3-C4 and C4-C5 grade 1 anterolisthesis. Other neck: No neck mass or cervical lymphadenopathy. Subcentimeter thyroid nodules not meeting consensus criteria for ultrasound follow-up. Upper chest: No consolidation within the imaged lung apices. Review of the MIP images confirms the above findings CTA HEAD FINDINGS  Anterior circulation: The intracranial internal carotid arteries are patent. Calcified plaque within both vessels with no more than mild stenosis. The M1 middle cerebral arteries are patent. No M2 proximal branch occlusion or high-grade proximal stenosis is identified. The anterior cerebral arteries are patent. No intracranial aneurysm is identified. Posterior circulation: Diminutive appearance of the intracranial right vertebral artery beyond the right PICA origin, and of the proximal right PICA. The dominant intracranial left vertebral artery is patent without stenosis, as is the basilar artery. Apparent 6 mm flow gap within the P2 left posterior cerebral artery. (Series 10, image 21). Severe focal stenosis within the right PCA at the P2/P3 junction (series 10, image 22) (series 12, image 15). Posterior communicating arteries are hypoplastic or absent bilaterally. Venous sinuses: Within the limitations of contrast timing, no convincing thrombus. Anatomic variants: As described Review of the MIP images confirms the above findings These results were called by telephone at the time of interpretation on 10/05/2020 at 5:1o pm to provider Dr. Curly Shores, who verbally acknowledged these results. IMPRESSION: CTA neck: 1. The bilateral common and internal carotid arteries are patent within the neck without stenosis or significant atherosclerotic disease. 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 2. Vertebral arteries patent within the neck without stenosis. The left vertebral artery is significantly dominant. The right vertebral artery is developmentally diminutive. CTA head: 1. Diminutive appearance of the non-dominant intracranial right vertebral artery beyond the right PICA origin, and of the proximal right PICA. Findings are indeterminate in etiology and could be secondary to developmentally small vessel size or atherosclerotic stenosis. Dissection is difficult to definitively exclude, but this would be an atypical location.  2. 6 mm flow gap within the P2 left posterior cerebral artery. This may reflect occlusion with distal reconstitution or severe stenosis. 3. Severe right PCA stenosis at the P2/P3 junction. Electronically Signed   By: Kellie Simmering DO   On: 10/05/2020 17:39   DG Chest 2 View  Result Date: 10/05/2020 CLINICAL DATA:  Altered mental status and nausea EXAM: CHEST - 2 VIEW COMPARISON:  None. FINDINGS: Cardiac shadow is mildly enlarged in size but accentuated by the frontal technique.  Aortic calcifications are noted. The lungs are clear. Mild degenerative changes of the thoracolumbar spine are noted. IMPRESSION: No acute abnormality noted. Electronically Signed   By: Inez Catalina M.D.   On: 10/05/2020 11:42   CT Head Wo Contrast  Result Date: 10/05/2020 CLINICAL DATA:  Altered mental status EXAM: CT HEAD WITHOUT CONTRAST TECHNIQUE: Contiguous axial images were obtained from the base of the skull through the vertex without intravenous contrast. COMPARISON:  None. FINDINGS: Brain: No evidence of acute infarction, hemorrhage, hydrocephalus, extra-axial collection or mass lesion/mass effect. Mild atrophic and chronic white matter ischemic changes are noted. Vascular: No hyperdense vessel or unexpected calcification. Skull: Normal. Negative for fracture or focal lesion. Sinuses/Orbits: No acute finding. Other: None. IMPRESSION: Chronic atrophic and ischemic changes without acute abnormality. Electronically Signed   By: Inez Catalina M.D.   On: 10/05/2020 12:37   CT Angio Neck W and/or Wo Contrast  Result Date: 10/05/2020 CLINICAL DATA:  Neuro deficit, acute, stroke suspected. EXAM: CT ANGIOGRAPHY HEAD AND NECK TECHNIQUE: Multidetector CT imaging of the head and neck was performed using the standard protocol during bolus administration of intravenous contrast. Multiplanar CT image reconstructions and MIPs were obtained to evaluate the vascular anatomy. Carotid stenosis measurements (when applicable) are obtained  utilizing NASCET criteria, using the distal internal carotid diameter as the denominator. CONTRAST:  10mL OMNIPAQUE IOHEXOL 350 MG/ML SOLN COMPARISON:  Brain MRI performed earlier today 10/05/2020. Head CT 10/05/2020. FINDINGS: CTA NECK FINDINGS Aortic arch: Standard aortic branching. Atherosclerotic plaque within the visualized aortic arch and proximal major branch vessels of the neck. No hemodynamically significant innominate or proximal subclavian artery stenosis. Right carotid system: CCA and ICA patent within the neck without stenosis. No significant atherosclerotic disease. Carotid web within the right carotid bulb (series 9, image 60). Left carotid system: CCA and ICA patent within the neck without stenosis. No significant atherosclerotic disease. Vertebral arteries: Patent within the neck without stenosis. The left vertebral artery is dominant. The right vertebral artery is developmentally diminutive. Skeleton: No acute bony abnormality or aggressive osseous lesion. Reversal of the expected cervical lordosis. C3-C4 and C4-C5 grade 1 anterolisthesis. Other neck: No neck mass or cervical lymphadenopathy. Subcentimeter thyroid nodules not meeting consensus criteria for ultrasound follow-up. Upper chest: No consolidation within the imaged lung apices. Review of the MIP images confirms the above findings CTA HEAD FINDINGS Anterior circulation: The intracranial internal carotid arteries are patent. Calcified plaque within both vessels with no more than mild stenosis. The M1 middle cerebral arteries are patent. No M2 proximal branch occlusion or high-grade proximal stenosis is identified. The anterior cerebral arteries are patent. No intracranial aneurysm is identified. Posterior circulation: Diminutive appearance of the intracranial right vertebral artery beyond the right PICA origin, and of the proximal right PICA. The dominant intracranial left vertebral artery is patent without stenosis, as is the basilar  artery. Apparent 6 mm flow gap within the P2 left posterior cerebral artery. (Series 10, image 21). Severe focal stenosis within the right PCA at the P2/P3 junction (series 10, image 22) (series 12, image 15). Posterior communicating arteries are hypoplastic or absent bilaterally. Venous sinuses: Within the limitations of contrast timing, no convincing thrombus. Anatomic variants: As described Review of the MIP images confirms the above findings These results were called by telephone at the time of interpretation on 10/05/2020 at 5:1o pm to provider Dr. Curly Shores, who verbally acknowledged these results. IMPRESSION: CTA neck: 1. The bilateral common and internal carotid arteries are patent within the neck without stenosis  or significant atherosclerotic disease. 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 2. Vertebral arteries patent within the neck without stenosis. The left vertebral artery is significantly dominant. The right vertebral artery is developmentally diminutive. CTA head: 1. Diminutive appearance of the non-dominant intracranial right vertebral artery beyond the right PICA origin, and of the proximal right PICA. Findings are indeterminate in etiology and could be secondary to developmentally small vessel size or atherosclerotic stenosis. Dissection is difficult to definitively exclude, but this would be an atypical location. 2. 6 mm flow gap within the P2 left posterior cerebral artery. This may reflect occlusion with distal reconstitution or severe stenosis. 3. Severe right PCA stenosis at the P2/P3 junction. Electronically Signed   By: Kellie Simmering DO   On: 10/05/2020 17:39   MR BRAIN WO CONTRAST  Result Date: 10/05/2020 CLINICAL DATA:  Acute neuro deficit EXAM: MRI HEAD WITHOUT CONTRAST TECHNIQUE: Multiplanar, multiecho pulse sequences of the brain and surrounding structures were obtained without intravenous contrast. COMPARISON:  CT head 10/05/2020 FINDINGS: Brain: Restricted diffusion in the tail of  the hippocampus on the right. Small areas of restricted diffusion in the thalamus bilaterally most notably in the left posterior thalamus. Midbrain spared. No supratentorial acute infarct. Generalized atrophy. Patchy deep white matter hyperintensities bilaterally, mild-to-moderate in degree. Negative for hemorrhage or mass. Vascular: Normal arterial flow voids Skull and upper cervical spine: No focal skeletal lesion. Sinuses/Orbits: Paranasal sinuses clear. Bilateral cataract extraction Other: None IMPRESSION: Patchy areas of restricted diffusion in the thalamus bilaterally left greater than right, and in the tail the hippocampus on the right. Midbrain spared. This is consistent with posterior circulation infarct. Consider distal basilar thrombus or artery of [place] incomplete infarction. CT angio head and neck recommended for further evaluation. Chronic microvascular ischemic change in the white matter. Electronically Signed   By: Franchot Gallo M.D.   On: 10/05/2020 16:18    Procedures Procedures (including critical care time)  Medications Ordered in ED Medications  clopidogrel (PLAVIX) tablet 300 mg (300 mg Oral Given 10/05/20 1944)    Followed by  clopidogrel (PLAVIX) tablet 75 mg (has no administration in time range)  traMADol (ULTRAM) tablet 50 mg (has no administration in time range)  tiZANidine (ZANAFLEX) tablet 2-4 mg (has no administration in time range)  loratadine (CLARITIN) tablet 10 mg (10 mg Oral Not Given 10/05/20 2323)   stroke: mapping our early stages of recovery book (has no administration in time range)  acetaminophen (TYLENOL) tablet 650 mg (has no administration in time range)    Or  acetaminophen (TYLENOL) 160 MG/5ML solution 650 mg (has no administration in time range)    Or  acetaminophen (TYLENOL) suppository 650 mg (has no administration in time range)  enoxaparin (LOVENOX) injection 40 mg (40 mg Subcutaneous Not Given 10/05/20 2324)  senna-docusate (Senokot-S)  tablet 1 tablet (has no administration in time range)  hydrALAZINE (APRESOLINE) tablet 25 mg (has no administration in time range)  sodium chloride 0.9 % bolus 1,000 mL (0 mLs Intravenous Stopped 10/05/20 1854)  iohexol (OMNIPAQUE) 350 MG/ML injection 75 mL (75 mLs Intravenous Contrast Given 10/05/20 1701)    ED Course  I have reviewed the triage vital signs and the nursing notes.  Pertinent labs & imaging results that were available during my care of the patient were reviewed by me and considered in my medical decision making (see chart for details).    MDM Rules/Calculators/A&P  Ct scans and labs nl.  I have ordered a MRI as I am worried about a possible stroke.  She is not VAN +, so a code stroke was not called.  MRI still pending at shift change.  Pt signed out to Dr. Ralene Bathe at shift change.  Final Clinical Impression(s) / ED Diagnoses Final diagnoses:  Acute CVA (cerebrovascular accident) ([place])  Altered mental status, unspecified altered mental status type    Rx / DC Orders ED Discharge Orders    None       Isla Pence, MD 10/06/20 [phone]

## 2020-10-05 NOTE — ED Notes (Signed)
She already had a covid test that was negative

## 2020-10-05 NOTE — ED Triage Notes (Signed)
Pt brought to ED via EMS from home for AMS. Pt found sitting in driveway by neighbors. EMS reports son last spoke with pt 3 days ago and she was at her normal baseline. Pt given 4mg  zofran PTA for nausea. Pt currently alert and oriented to self and place. EDP at bedside on arrival.  EMS v/s: 170/90 BP 96 HR 18 RR 96% on room air 109 CBG

## 2020-10-05 NOTE — ED Notes (Signed)
The pt repeats herself over and over still same statements

## 2020-10-05 NOTE — ED Provider Notes (Signed)
Patient care assumed at 1500.  Pt here with AMS, usually lives at home alone.  MRI pending.   MRI concerning for possible basilar artery occlusion. Discussed with neurologist, will activate code stroke given this new finding. On examination patient is confused, repetitive but has no focal neurologic deficits. After CTA obtained she is found not to be an IR candidate. Plan to admit to medicine service for further workup and treatment. Hospitalist consulted for admission.   Quintella Reichert, MD 10/05/20 [phone]

## 2020-10-06 ENCOUNTER — Encounter (HOSPITAL_COMMUNITY): Payer: Self-pay | Admitting: Internal Medicine

## 2020-10-06 ENCOUNTER — Inpatient Hospital Stay (HOSPITAL_COMMUNITY): Payer: Medicare Other

## 2020-10-06 DIAGNOSIS — I6389 Other cerebral infarction: Secondary | ICD-10-CM

## 2020-10-06 DIAGNOSIS — I63213 Cerebral infarction due to unspecified occlusion or stenosis of bilateral vertebral arteries: Secondary | ICD-10-CM

## 2020-10-06 DIAGNOSIS — I361 Nonrheumatic tricuspid (valve) insufficiency: Secondary | ICD-10-CM

## 2020-10-06 LAB — ECHOCARDIOGRAM COMPLETE BUBBLE STUDY
AR max vel: 2.57 cm2
AV Area VTI: 2.6 cm2
AV Area mean vel: 2.61 cm2
AV Mean grad: 2.3 mmHg
AV Peak grad: 4.6 mmHg
Ao pk vel: 1.07 m/s
Area-P 1/2: 4.8 cm2
S' Lateral: 2.9 cm

## 2020-10-06 NOTE — Progress Notes (Signed)
Checking patient into 3W. Patient oriented to room and equipment. Patient demonstrated proper use of equipment. Patient ambulatory with stand-by assist to restroom.  Patient has clothing items, shoes, a bag of snacks, and bottled water at admission. Patient states she had her sons cell phone in the ED, but could not find it. I called ED to inform RN that patient states she was missing her sons cell phone.

## 2020-10-06 NOTE — ED Notes (Signed)
The pts bed changed fresh sheets pure wick still in place

## 2020-10-06 NOTE — Progress Notes (Signed)
Paged MD r/t possible new afib/flutter - Dr Myna Hidalgo "her EKG from admit says afib at the top but I'm thinking it might have been some artifact or something, maybe that's what is going on here." Did relay that a new EKG is currently being collected per standing orders for rhythm change. Dr Myna Hidalgo "okay, I will keep an eye out for that." Did also receive orders for a UA/culture, as the patient has had increased frequency and is complaining of lower back pain and burning upon urination.

## 2020-10-06 NOTE — Progress Notes (Signed)
  Echocardiogram 2D Echocardiogram has been performed.  Tricia Newman 10/06/2020, 4:15 PM

## 2020-10-06 NOTE — Progress Notes (Signed)
STROKE TEAM PROGRESS NOTE   HISTORY OF PRESENT ILLNESS (per record) Tricia Newman is a 79 y.o. female with a medical history significant for HTN, migraine headaches, and gastrointestinal bleeding (hematemesis) while on aspirin who presented to [facility] ED via EMS 12/17 for AMS. The most recent report of patient's baseline mental status was from her son who went to dinner with her 3 days ago at her baseline mental status. EMS was called today when neighbors found Tricia Newman sitting in the driveway confused and perseverating.  Son reports that at baseline she lives independently, manages her own finances, her medications, enjoys gardening and participates regularly with her preventative medical care On ED arrival she was noted to be confused and MRI brain was obtained.  This revealed concern for bilateral posterior circulation strokes concerning for potential basilar thrombus.  Therefore code stroke was activated and patient was taken emergently for CTA, which cleared her basilar artery.  NIH stroke scale remained 0 LKW: 3 days ago (10/02/2020) tpa given?: no, outside of time window    INTERVAL HISTORY Her family is not at bedside.  Patient says that she feels better but still "not herself".  MRI showed bilateral posterior circulation strokes.    OBJECTIVE Vitals:   10/06/20 0300 10/06/20 0301 10/06/20 0335 10/06/20 0350  BP: (!) 155/70  (!) 143/86   Pulse: 79  66   Resp: 19 18 18    Temp:  98.3 F (36.8 C)  98.2 F (36.8 C)  TempSrc:    Oral  SpO2: 98%  98%   Weight:      Height:        CBC:  Recent Labs  Lab 10/05/20 1117  WBC 4.2  NEUTROABS 2.3  HGB 12.4  HCT 38.1  MCV 91.1  PLT 100*    Basic Metabolic Panel:  Recent Labs  Lab 10/05/20 1117  NA 138  K 4.2  CL 105  CO2 22  GLUCOSE 99  BUN 15  CREATININE 0.58  CALCIUM 8.8*    Lipid Panel:     Component Value Date/Time   CHOL 183 10/05/2020 1117   TRIG 67 10/05/2020 1117   HDL 71 10/05/2020 1117   CHOLHDL 2.6  10/05/2020 1117   VLDL 13 10/05/2020 1117   LDLCALC 99 10/05/2020 1117   LDLCALC 95 08/01/2020 0754   HgbA1c:  Lab Results  Component Value Date   HGBA1C 4.9 10/05/2020   Urine Drug Screen: No results found for: LABOPIA, COCAINSCRNUR, LABBENZ, AMPHETMU, THCU, LABBARB  Alcohol Level No results found for: ETH  IMAGING  CT Angio Head W or Wo Contrast  CT Angio Neck W and/or Wo Contrast 10/05/2020 IMPRESSION:   CTA neck:  1. The bilateral common and internal carotid arteries are patent within the neck without stenosis or significant atherosclerotic disease. 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  2. Vertebral arteries patent within the neck without stenosis. The left vertebral artery is significantly dominant. The right vertebral artery is developmentally diminutive.   CTA head:  1. Diminutive appearance of the non-dominant intracranial right vertebral artery beyond the right PICA origin, and of the proximal right PICA. Findings are indeterminate in etiology and could be secondary to developmentally small vessel size or atherosclerotic stenosis. Dissection is difficult to definitively exclude, but this would be an atypical location.  2. 6 mm flow gap within the P2 left posterior cerebral artery. This may reflect occlusion with distal reconstitution or severe stenosis.  3. Severe right PCA stenosis at the P2/P3  junction.    CT Head Wo Contrast 10/05/2020 IMPRESSION:  Chronic atrophic and ischemic changes without acute abnormality.   MR BRAIN WO CONTRAST 10/05/2020 IMPRESSION:  Patchy areas of restricted diffusion in the thalamus bilaterally left greater than right, and in the tail the hippocampus on the right. Midbrain spared. This is consistent with posterior circulation infarct. Consider distal basilar thrombus or artery of Percher[place] incomplete infarction. CT angio head and neck recommended for further evaluation. Chronic microvascular ischemic change in the white matter.   DG  Chest 2 View 10/05/2020 IMPRESSION:  No acute abnormality noted.  Transthoracic Echocardiogram With Bubble Study 00/00/2021 Pending  ECG - SR rate 76 BPM. Computer read as atrial fibrillation but appears to be SR. (See cardiology reading for complete details)  PHYSICAL EXAM Blood pressure (!) 143/86, pulse 66, temperature 98.2 F (36.8 C), temperature source Oral, resp. rate 18, height 5\' 1"  (1.549 m), weight 64 kg, SpO2 98 %.  An elderly white female laying in bed no acute distress, awake and alert, appears to have baseline memory changes, speech is normal without dysarthria or aphasia, naming repetition intact, pupils equally round and reactive to light, visual fields full, extraocular movements intact, facial sensation intact, smile is symmetric, hearing intact to voice, tongue midline without fasciculations, strength is antigravity and symmetric without drift in all extremities, tone is normal, bulk is normal, intact to light touch in all extremities, coordination and gait deferred.  ASSESSMENT/PLAN Tricia Newman is a 79 y.o. female with history of HTN, migraine headaches, and gastrointestinal bleeding (hematemesis) while on aspirin who presented to [facility] ED via EMS 12/17 after she was found confused and perseverating.  She did not receive IV t-PA due to late presentation (>4.5 hours from time of onset)  Stroke: bilateral posterior infarcts - embolic - unknown etiology.  Code Stroke CT Head - not ordered  CT head - Chronic atrophic and ischemic changes without acute abnormality.   MRI head - Patchy areas of restricted diffusion in the thalamus bilaterally left greater than right, and in the tail the hippocampus on the right. Midbrain spared. This is consistent with posterior circulation infarct. Consider distal basilar thrombus or artery of P[place]heron incomplete infarction. Chronic microvascular ischemic change in the white matter.   MRA head - not ordered  CTA Head -   Diminutive appearance of the non-dominant intracranial right vertebral artery beyond the right PICA origin, and of the proximal right PICA. Findings are indeterminate in etiology and could be secondary to developmentally small vessel size or atherosclerotic stenosis. Dissection is difficult to definitively exclude, but this would be an atypical location. 6 mm flow gap within the P2 left posterior cerebral artery. This may reflect occlusion with distal reconstitution or severe stenosis. Severe right PCA stenosis at the P2/P3 junction.    CTA Neck - The bilateral common and internal carotid arteries are patent within the neck without stenosis or significant atherosclerotic disease. 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 Vertebral arteries patent within the neck without stenosis. The left vertebral artery is significantly dominant. The right vertebral artery is developmentally diminutive.   CT Perfusion - not ordered  Carotid Doppler - CTA neck ordered - carotid dopplers not indicated.  2D Echo w bubble study - pending  Patient may need further evaluation with TEE and loop, pending 2D echo and discussions with family, patient still in the emergency room, will see patient in the morning and hopefully family is available to discuss.  [employer] Virus 2 -  negative  LDL - 99  HgbA1c - 4.9  UDS - not ordered VTE prophylaxis - Lovenox 40 mg daily  Diet  Diet Order            Diet Heart Room service appropriate? Yes; Fluid consistency: Thin  Diet effective now                 No antithrombotic prior to admission, now on clopidogrel 75 mg daily  Patient counseled to be compliant with her antithrombotic medications  Ongoing aggressive stroke risk factor management  Therapy recommendations:  pending  Disposition:  Pending  Hypertension  Home BP meds: Lotensin  Current BP meds: Hydralazine prn  Stable . Permissive hypertension (OK if < 220/120) but gradually normalize in 5-7 days   . Long-term BP goal normotensive  Hyperlipidemia  Home Lipid lowering medication: none   LDL 99, goal < 70  Current lipid lowering medication: none - will add Lipitor 40 mg daily  Continue statin at discharge  Other Stroke Risk Factors  Advanced age  Former cigarette smoker - quit > 40 years ago  Migraines  Other Active Problems, Findings and Recommendations  Code status - Full code  Hx of Gi bleed with ASA (Per Dr Curly [place] - consider adding ASA 81mg  daily later if pt agreeable)  Mild thrombocytopenia - platelets - [facility] day # 1  Personally examined patient and images, and have participated in and made any corrections needed to history, physical, neuro exam,assessment and plan as stated above.  I have personally obtained the history, evaluated lab date, reviewed imaging studies and agree with radiology interpretations.    Sarina Ill, MD Stroke Neurology  I spent 35 minutes of face-to-face and non-face-to-face time with patient. This included prechart review, lab review, study review, order entry, electronic health record documentation, patient education on the different diagnostic and therapeutic options, counseling and coordination of care, risks and benefits of management, compliance, or risk factor reduction   To contact Stroke Continuity provider, please refer to [email or web address] After hours, contact General Neurology

## 2020-10-06 NOTE — ED Notes (Signed)
Tele; Breakfast Order placed 

## 2020-10-06 NOTE — Progress Notes (Signed)
[facility] Progress Note  Patient: Tricia Newman    LAG:[id number]  DOA: 10/05/2020     Date of Service: the patient was seen and examined on 10/06/2020  Brief hospital course: Past medical history of HTN, migraine, GI bleed.  Presents with confusion found to have acute stroke. Currently plan is for the stroke work-up.  Assessment and Plan: 1.  Bilateral thalamic stroke Unknown etiology. CT head chronic changes. MRI brain bilateral posterior circulation infarct. CTA posterior circulation stenosis. Echocardiogram currently pending. Currently on aspirin. Per neurology may require TEE and loop recorder. LDL 99 on Lipitor.  2.  Essential hypertension Allowing permissive hypertension. Currently holding home medication.  3.  Migraine Continue to monitor  4.  Thrombocytopenia Etiology not clear but functional continue to monitor.  5.  Chronic muscle spasm On Zanaflex as needed  Diet: Cardiac diet DVT Prophylaxis:   enoxaparin (LOVENOX) injection 40 mg Start: 10/05/20 1930    Advance goals of care discussion: Full code  Family Communication: no family was present at bedside, at the time of interview.   Disposition:  Status is: Inpatient  Remains inpatient appropriate because:IV treatments appropriate due to intensity of illness or inability to take PO   Dispo: The patient is from: Home              Anticipated d/c is to: Home              Anticipated d/c date is: 2 days              Patient currently is not medically stable to d/c.  Subjective: No nausea no vomiting.  No fever no chills.  Physical Exam:  General: Appear in mild distress, no Rash; Oral Mucosa Clear, moist. no Abnormal Neck Mass Or lumps, Conjunctiva normal  Cardiovascular: S1 and S2 Present, no Murmur, Respiratory: good respiratory effort, Bilateral Air entry present and CTA, no Crackles, no wheezes Abdomen: Bowel Sound present, Soft and no tenderness Extremities: no Pedal  edema Neurology: alert and oriented to time, place, and person affect appropriate. no new focal deficit Gait not checked due to patient safety concerns  Vitals:   10/06/20 1300 10/06/20 1330 10/06/20 1400 10/06/20 1430  BP: (!) 167/73 (!) 160/75 (!) 164/83 (!) 173/82  Pulse: 77 76 76 71  Resp: 18 20 12 15   Temp:    98.3 F (36.8 C)  TempSrc:    Oral  SpO2: 98% 96% 99% 98%  Weight:      Height:        Intake/Output Summary (Last 24 hours) at 10/06/2020 1809 Last data filed at 10/06/2020 0305 Gross per 24 hour  Intake --  Output 550 ml  Net -550 ml   Filed Weights   10/05/20 1112  Weight: 64 kg    Data Reviewed: I have personally reviewed and interpreted daily labs, tele strips, imagings as discussed above. I reviewed all nursing notes, pharmacy notes, vitals, pertinent old records I have discussed plan of care as described above with RN and patient/family.  CBC: Recent Labs  Lab 10/05/20 1117  WBC 4.2  NEUTROABS 2.3  HGB 12.4  HCT 38.1  MCV 91.1  PLT 122*   Basic Metabolic Panel: Recent Labs  Lab 10/05/20 1117  NA 138  K 4.2  CL 105  CO2 22  GLUCOSE 99  BUN 15  CREATININE 0.58  CALCIUM 8.8*    Studies: ECHOCARDIOGRAM COMPLETE BUBBLE STUDY  Result Date: 10/06/2020    ECHOCARDIOGRAM REPORT   Patient  Name:   Tricia Newman [facility] Date of Exam: 10/06/2020 Medical Rec #:  [id number]         Height:       61.0 in Accession #:    [id number]        Weight:       141.1 lb Date of Birth:  18-Dec-1940         BSA:          1.628 m Patient Age:    79 years          BP:           173/82 mmHg Patient Gender: F                 HR:           76 bpm. Exam Location:  Inpatient Procedure: 2D Echo Indications:    stroke  History:        Patient has no prior history of Echocardiogram examinations.                 Signs/Symptoms:Altered Mental Status; Risk Factors:Hypertension.  Sonographer:    Johny Chess Referring Phys: [id number] [place]  1. Left  ventricular ejection fraction, by estimation, is 60 to 65%. The left ventricle has normal function. The left ventricle has no regional wall motion abnormalities. Left ventricular diastolic parameters were normal.  2. Right ventricular systolic function is normal. The right ventricular size is normal. There is normal pulmonary artery systolic pressure.  3. The mitral valve is normal in structure. Trivial mitral valve regurgitation. No evidence of mitral stenosis.  4. The aortic valve is tricuspid. Aortic valve regurgitation is not visualized. No aortic stenosis is present.  5. The inferior vena cava is normal in size with greater than 50% respiratory variability, suggesting right atrial pressure of 3 mmHg.  6. Agitated saline contrast bubble study was negative, with no evidence of any interatrial shunt. FINDINGS  Left Ventricle: Left ventricular ejection fraction, by estimation, is 60 to 65%. The left ventricle has normal function. The left ventricle has no regional wall motion abnormalities. The left ventricular internal cavity size was normal in size. There is  no left ventricular hypertrophy. Left ventricular diastolic parameters were normal. Right Ventricle: The right ventricular size is normal. No increase in right ventricular wall thickness. Right ventricular systolic function is normal. There is normal pulmonary artery systolic pressure. The tricuspid regurgitant velocity is 2.73 m/s, and  with an assumed right atrial pressure of 3 mmHg, the estimated right ventricular systolic pressure is 46.2 mmHg. Left Atrium: Left atrial size was normal in size. Right Atrium: Right atrial size was normal in size. Pericardium: There is no evidence of pericardial effusion. Mitral Valve: The mitral valve is normal in structure. Trivial mitral valve regurgitation. No evidence of mitral valve stenosis. Tricuspid Valve: The tricuspid valve is normal in structure. Tricuspid valve regurgitation is mild . No evidence of tricuspid  stenosis. Aortic Valve: The aortic valve is tricuspid. Aortic valve regurgitation is not visualized. No aortic stenosis is present. Aortic valve mean gradient measures 2.3 mmHg. Aortic valve peak gradient measures 4.6 mmHg. Aortic valve area, by VTI measures 2.60 cm. Pulmonic Valve: The pulmonic valve was not well visualized. Pulmonic valve regurgitation is not visualized. No evidence of pulmonic stenosis. Aorta: The aortic root is normal in size and structure. Pulmonary Artery: Borderline pulmonary HTN, PASP is 30 mmHg.  Venous: The inferior vena cava is normal in size with greater than 50%  respiratory variability, suggesting right atrial pressure of 3 mmHg. IAS/Shunts: No atrial level shunt detected by color flow Doppler. Agitated saline contrast was given intravenously to evaluate for intracardiac shunting. Agitated saline contrast bubble study was negative, with no evidence of any interatrial shunt.  LEFT VENTRICLE PLAX 2D LVIDd:         4.20 cm  Diastology LVIDs:         2.90 cm  LV e' medial:    9.36 cm/s LV PW:         1.00 cm  LV E/e' medial:  11.2 LV IVS:        0.80 cm  LV e' lateral:   11.70 cm/s LVOT diam:     1.90 cm  LV E/e' lateral: 9.0 LV SV:         59 LV SV Index:   36 LVOT Area:     2.84 cm  RIGHT VENTRICLE             IVC RV S prime:     14.40 cm/s  IVC diam: 1.70 cm TAPSE (M-mode): 2.4 cm LEFT ATRIUM           Index       RIGHT ATRIUM           Index LA diam:      3.60 cm 2.21 cm/m  RA Area:     13.30 cm LA Vol (A4C): 37.2 ml 22.84 ml/m RA Volume:   30.00 ml  18.42 ml/m  AORTIC VALVE AV Area (Vmax):    2.57 cm AV Area (Vmean):   2.61 cm AV Area (VTI):     2.60 cm AV Vmax:           107.23 cm/s AV Vmean:          72.795 cm/s AV VTI:            0.228 m AV Peak Grad:      4.6 mmHg AV Mean Grad:      2.3 mmHg LVOT Vmax:         97.16 cm/s LVOT Vmean:        66.947 cm/s LVOT VTI:          0.210 m LVOT/AV VTI ratio: 0.92  AORTA Ao Root diam: 3.00 cm Ao Asc diam:  3.40 cm MITRAL VALVE                 TRICUSPID VALVE MV Area (PHT): 4.80 cm     TR Peak grad:   29.8 mmHg MV Decel Time: 158 msec     TR Vmax:        273.00 cm/s MV E velocity: 105.00 cm/s MV A velocity: 97.70 cm/s   SHUNTS MV E/A ratio:  1.07         Systemic VTI:  0.21 m                             Systemic Diam: 1.90 cm Carlyle Dolly MD Electronically signed by Carlyle Dolly MD Signature Date/Time: 10/06/2020/4:13:12 PM    Final     Scheduled Meds:   stroke: mapping our early stages of recovery book   Does not apply Once   clopidogrel  75 mg Oral Daily   enoxaparin (LOVENOX) injection  40 mg Subcutaneous Q24H   loratadine  10 mg Oral Daily   Continuous Infusions: PRN Meds: acetaminophen **OR** acetaminophen (TYLENOL) oral liquid 160 mg/5 mL **  OR** acetaminophen, hydrALAZINE, senna-docusate, tiZANidine, traMADol  Time spent: 35 minutes  Author: Berle Mull, MD [facility] 10/06/2020 6:09 PM  To reach On-call, see care teams to locate the attending and reach out via [email or web address]. Between 7PM-7AM, please contact night-coverage If you still have difficulty reaching the attending provider, please page the [facility] (Director on Call) for [facility] on amion for assistance.

## 2020-10-07 DIAGNOSIS — I483 Typical atrial flutter: Secondary | ICD-10-CM

## 2020-10-07 LAB — CBC WITH DIFFERENTIAL/PLATELET
Abs Immature Granulocytes: 0.01 10*3/uL (ref 0.00–0.07)
Basophils Absolute: 0 10*3/uL (ref 0.0–0.1)
Basophils Relative: 0 %
Eosinophils Absolute: 0 10*3/uL (ref 0.0–0.5)
Eosinophils Relative: 0 %
HCT: 39.9 % (ref 36.0–46.0)
Hemoglobin: 13.2 g/dL (ref 12.0–15.0)
Immature Granulocytes: 0 %
Lymphocytes Relative: 17 %
Lymphs Abs: 1.2 10*3/uL (ref 0.7–4.0)
MCH: 29.1 pg (ref 26.0–34.0)
MCHC: 33.1 g/dL (ref 30.0–36.0)
MCV: 88.1 fL (ref 80.0–100.0)
Monocytes Absolute: 0.5 10*3/uL (ref 0.1–1.0)
Monocytes Relative: 7 %
Neutro Abs: 5.2 10*3/uL (ref 1.7–7.7)
Neutrophils Relative %: 76 %
Platelets: 143 10*3/uL — ABNORMAL LOW (ref 150–400)
RBC: 4.53 MIL/uL (ref 3.87–5.11)
RDW: 13.9 % (ref 11.5–15.5)
WBC: 6.9 10*3/uL (ref 4.0–10.5)
nRBC: 0 % (ref 0.0–0.2)

## 2020-10-07 LAB — URINALYSIS, ROUTINE W REFLEX MICROSCOPIC
Bilirubin Urine: NEGATIVE
Glucose, UA: NEGATIVE mg/dL
Hgb urine dipstick: NEGATIVE
Ketones, ur: NEGATIVE mg/dL
Leukocytes,Ua: NEGATIVE
Nitrite: NEGATIVE
Protein, ur: NEGATIVE mg/dL
Specific Gravity, Urine: 1.012 (ref 1.005–1.030)
pH: 7 (ref 5.0–8.0)

## 2020-10-07 LAB — GLUCOSE, CAPILLARY: Glucose-Capillary: 154 mg/dL — ABNORMAL HIGH (ref 70–99)

## 2020-10-07 MED ORDER — APIXABAN 5 MG PO TABS
5.0000 mg | ORAL_TABLET | Freq: Two times a day (BID) | ORAL | Status: DC
  Administered 2020-10-07: 5 mg via ORAL
  Filled 2020-10-07: qty 1

## 2020-10-07 MED ORDER — APIXABAN 5 MG PO TABS: 5.0000 mg | ORAL_TABLET | Freq: Two times a day (BID) | ORAL | 0 refills | Status: DC

## 2020-10-07 MED ORDER — ATORVASTATIN CALCIUM 40 MG PO TABS: 40.0000 mg | ORAL_TABLET | Freq: Every day | ORAL | 0 refills | Status: DC

## 2020-10-07 MED ORDER — ATORVASTATIN CALCIUM 40 MG PO TABS: 40.0000 mg | ORAL_TABLET | Freq: Every day | ORAL | Status: DC

## 2020-10-07 MED ORDER — ONDANSETRON HCL 4 MG/2ML IJ SOLN
4.0000 mg | Freq: Four times a day (QID) | INTRAMUSCULAR | Status: DC | PRN
  Administered 2020-10-07: 4 mg via INTRAVENOUS
  Filled 2020-10-07: qty 2

## 2020-10-07 NOTE — Evaluation (Addendum)
Physical Therapy Evaluation Patient Details Name: Tricia Newman MRN: [id number] DOB: [birth year] Today's Date: 10/07/2020   History of Present Illness  79 y.o. female with medical history significant of HTN, migraines, chronic back pain, presented with change in mental status and gait instability, found to have bilateral thalamic infarcts on MRI. Pt also found to be in a-flutter during this admission.  Clinical Impression  Pt presents to PT with deficits in cognition, gait, balance, and reports weakness. Pt is able to perform all mobility required in the home without physical assistance at this time although she does demonstrate balance deviations when negotiating stairs or when performing dynamic gait tasks. PT also notes some short term memory deficits during session as the pt repeats facts she has already told this PT earlier in the session and is unable to recall the day of the week after reviewing earlier in the session. Pt will benefit from continued acute PT POC to improve balance and restore independence. PT recommends discharge home with 24/7 supervision of family and outpatient PT. If family is unable to provide 24/7 assist then may need to consider ALF.    Follow Up Recommendations Outpatient PT;Supervision/Assistance - 24 hour    Equipment Recommendations  None recommended by PT    Recommendations for Other Services       Precautions / Restrictions Precautions Precautions: Fall Precaution Comments: A-fib Restrictions Weight Bearing Restrictions: No      Mobility  Bed Mobility Overal bed mobility: Independent             General bed mobility comments: not assessed, pt received sitting in recliner and left in same position    Transfers Overall transfer level: Independent Equipment used: None Transfers: Sit to/from [employer] to Stand: Supervision Stand pivot transfers: Min guard       General transfer comment: fair safety, no  LOB  Ambulation/Gait Ambulation/Gait assistance: Supervision Gait Distance (Feet): 300 Feet Assistive device: None Gait Pattern/deviations: Step-through pattern Gait velocity: functional Gait velocity interpretation: >2.62 ft/sec, indicative of community ambulatory General Gait Details: pt with slowed step through gait, able to increase gait speed with PT cues, increased time for turns and with changes in direction  Stairs Stairs: Yes Stairs assistance: Supervision Stair Management: No rails;Forwards;Step to pattern Number of Stairs: 3    Wheelchair Mobility    Modified Rankin (Stroke Patients Only) Modified Rankin (Stroke Patients Only) Pre-Morbid Rankin Score: No symptoms Modified Rankin: Slight disability     Balance Overall balance assessment: Needs assistance Sitting-balance support: No upper extremity supported;Feet supported Sitting balance-Leahy Scale: Good     Standing balance support: No upper extremity supported;During functional activity Standing balance-Leahy Scale: Good                               Pertinent Vitals/Pain Pain Assessment: No/denies pain    Home Living Family/patient expects to be discharged to:: Private residence Living Arrangements: Alone Available Help at Discharge: Neighbor;Family;Available PRN/intermittently Type of Home: House Home Access: Stairs to enter Entrance Stairs-Rails: None Entrance Stairs-Number of Steps: 3 Home Layout: One level Home Equipment: Walker - 2 wheels      Prior Function Level of Independence: Independent         Comments: loves gardening and the outdoors     Hand Dominance   Dominant Hand: Right    Extremity/Trunk Assessment   Upper Extremity Assessment Upper Extremity Assessment: Overall WFL for tasks assessed  Lower Extremity Assessment Lower Extremity Assessment: Overall WFL for tasks assessed    Cervical / Trunk Assessment Cervical / Trunk Assessment: Normal   Communication   Communication: No difficulties  Cognition Arousal/Alertness: Awake/alert Behavior During Therapy: WFL for tasks assessed/performed Overall Cognitive Status: Impaired/Different from baseline Area of Impairment: Memory                 Orientation Level: Disoriented to;Time Current Attention Level: Focused Memory: Decreased short-term memory       Problem Solving: Difficulty sequencing General Comments: difficulty with recall of year despite cuing from therapist on 3 occasions; perseverating on 2023. consistently endorsing difficulty with recall which is not baseline. 2 errors made on SBT with score <6 indicating normal cognition      General Comments General comments (skin integrity, edema, etc.): VSS on RA    Exercises     Assessment/Plan    PT Assessment Patient needs continued PT services  PT Problem List Decreased balance;Decreased mobility;Decreased cognition       PT Treatment Interventions Gait training;Functional mobility training;Stair training;Therapeutic activities;Balance training;Neuromuscular re-education;Cognitive remediation;Patient/family education    PT Goals (Current goals can be found in the Care Plan section)  Acute Rehab PT Goals Patient Stated Goal: to return to baseline, gardening PT Goal Formulation: With patient Time For Goal Achievement: 10/21/20 Potential to Achieve Goals: Good Additional Goals Additional Goal #1: Pt will score >19/24 on DGI to indicate a reduced risk for falls    Frequency Min 4X/week   Barriers to discharge        Co-evaluation               AM-PAC PT "6 Clicks" Mobility  Outcome Measure Help needed turning from your back to your side while in a flat bed without using bedrails?: None Help needed moving from lying on your back to sitting on the side of a flat bed without using bedrails?: None Help needed moving to and from a bed to a chair (including a wheelchair)?: None Help needed  standing up from a chair using your arms (e.g., wheelchair or bedside chair)?: None Help needed to walk in hospital room?: A Little Help needed climbing 3-5 steps with a railing? : A Little 6 Click Score: 22    End of Session   Activity Tolerance: Patient tolerated treatment well Patient left: in chair;with call bell/phone within reach;with chair alarm set Nurse Communication: Mobility status PT Visit Diagnosis: Other abnormalities of gait and mobility (R26.89)    Time: 8115-7262 PT Time Calculation (min) (ACUTE ONLY): 16 min   Charges:   PT Evaluation $PT Eval Low Complexity: [place], PT, DPT Acute Rehabilitation Pager: [phone]   Zenaida Niece 10/07/2020, 1:09 PM

## 2020-10-07 NOTE — Progress Notes (Signed)
Occupational Therapy Evaluation Patient Details Name: Tricia Newman MRN: [id number] DOB: October 12, 1941 Today's Date: 10/07/2020    History of Present Illness 79 y.o. female with medical history significant of HTN, migraines, chronic back pain, presented with change in mental status and gait instability, found to have bilateral thalamic infarcts on MRI. Pt also found to be in a-flutter during this admission.   Clinical Impression   At baseline pt endorsing indep with all ADL's and mobility and active gardening/outdoor activities and living alone. Pt presents with no significant acute focal deficits, with biggest concern at this time is cognition. Noted decreased attention, recall and sequencing during functional tasks despite SBT score <6. Pt requiring Sup-min guard for safety with transfers and ADL's, cues for scanning. ?visual status with difficulty with full assessment d/t difficulty with following commands for formal visual assessment. Functionally appears with decreased attention to peripheral visual fields requiring cues for scanning. Will benefit from continued skilled OT with recommendations listed below.     Follow Up Recommendations  Home health OT;Supervision/Assistance - 24 hour;Outpatient OT    Equipment Recommendations  None recommended by OT    Recommendations for Other Services       Precautions / Restrictions Precautions Precautions: Fall Precaution Comments: A-fib Restrictions Weight Bearing Restrictions: No      Mobility Bed Mobility Overal bed mobility: Independent             General bed mobility comments: Hob flat and no reliance on bed rail for transition to sitting.     Transfers Overall transfer level: supervision Equipment used: None Transfers: Sit to/from [employer] Sit to Stand: Supervision Stand pivot transfers: Min guard       General transfer comment: fair safety, no LOB    Balance Overall balance assessment: Needs  assistance Sitting-balance support: No upper extremity supported;Feet supported Sitting balance-Leahy Scale: Good     Standing balance support: No upper extremity supported;During functional activity Standing balance-Leahy Scale: Good                             ADL either performed or assessed with clinical judgement   ADL Overall ADL's : Needs assistance/impaired     Grooming: Standing;Supervision/safety;Cueing for safety Grooming Details (indicate cue type and reason): standing at sink, cues for scanning to identify needed items for grooming tasks         Upper Body Dressing : Independent   Lower Body Dressing: Supervision/safety   Toilet Transfer: Min guard   Toileting- Clothing Manipulation and Hygiene: Supervision/safety;Sit to/from stand   Tub/ Shower Transfer: Min guard   Functional mobility during ADLs: Min guard (for safety, no LOB observed during session) General ADL Comments: requiring cues for modifications and occasional sequencing/identification. grossly intact     Vision Baseline Vision/History: Wears glasses Wears Glasses: Reading only Vision Assessment?: Yes;Vision impaired- to be further tested in functional context Eye Alignment: Within Functional Limits Ocular Range of Motion: Within Functional Limits Saccades: Within functional limits Convergence: Within functional limits Additional Comments: during formal testing appears with slight concern for lateral field cuts bilaterally of ~30 degrees (mild difficulty with following commands for testing), during functional tasks observed needing to complete dramatic scanning/head turn to identify soap and paper towel dispenser outside of central vision. During session pt multiple times endorsing "light spots" in vision, but unable to describe and report that being baseline for months. PCP is aware. Monitored during session.      Perception  Perception Perception Tested?: No   Praxis Praxis Praxis  tested?: Within functional limits    Pertinent Vitals/Pain Pain Assessment: No/denies pain     Hand Dominance Right   Extremity/Trunk Assessment Upper Extremity Assessment Upper Extremity Assessment: Overall WFL for tasks assessed   Lower Extremity Assessment Lower Extremity Assessment: Overall WFL for tasks assessed   Cervical / Trunk Assessment Cervical / Trunk Assessment: Normal   Communication Communication Communication: No difficulties   Cognition Arousal/Alertness: Awake/alert Behavior During Therapy: WFL for tasks assessed/performed Overall Cognitive Status: Impaired/Different from baseline Area of Impairment: Memory Orientation Level: Disoriented to;Time Current Attention Level: Focused Memory: Decreased short-term memory       Problem Solving: Difficulty sequencing General Comments: difficulty with recall of year despite cuing from therapist on 3 occasions; perseverating on 2023. consistently endorsing difficulty with recall which is not baseline. 2 errors made on SBT with score <6 indicating normal cognition   General Comments  VSS on RA    Exercises     Shoulder Instructions      Home Living Family/patient expects to be discharged to:: Private residence Living Arrangements: Alone Available Help at Discharge: Neighbor;Family;Available PRN/intermittently Type of Home: House Home Access: Stairs to enter [employer] of Steps: 3 Entrance Stairs-Rails: None Home Layout: One level     Bathroom Shower/Tub: Occupational psychologist: Standard Bathroom Accessibility: Yes How Accessible: Accessible via walker Home Equipment: Walker - 2 wheels    Prior Functioning/Environment Level of Independence: Independent  Comments: loves gardening and the outdoors        OT Problem List: Decreased cognition;Decreased coordination;Decreased activity tolerance;Decreased safety awareness;Decreased knowledge of use of DME or AE;Decreased knowledge  of precautions      OT Treatment/Interventions: Self-care/ADL training;Therapeutic exercise;Neuromuscular education;Cognitive remediation/compensation;Visual/perceptual remediation/compensation;Therapeutic activities;DME and/or AE instruction;Patient/family education    OT Goals(Current goals can be found in the care plan section) Acute Rehab OT Goals Patient Stated Goal: to return to baseline, gardening OT Goal Formulation: With patient Time For Goal Achievement: 10/21/20 Potential to Achieve Goals: Good ADL Goals Pt Will Perform Upper Body Bathing: with modified independence Pt Will Perform Lower Body Bathing: with modified independence Pt Will Transfer to Toilet: Independently Pt Will Perform Toileting - Clothing Manipulation and hygiene: Independently Additional ADL Goal #1: pt will be able to complete pill box assessment with 0 errors by 10/21/2020 Additional ADL Goal #2: pt will be able to complete item retrieval and transport in prep for ADL completiong with mod indep  OT Frequency: Min 2X/week   Barriers to D/C: Decreased caregiver support          Co-evaluation              AM-PAC OT "6 Clicks" Daily Activity     Outcome Measure Help from another person eating meals?: None Help from another person taking care of personal grooming?: A Little Help from another person toileting, which includes using toliet, bedpan, or urinal?: A Little Help from another person bathing (including washing, rinsing, drying)?: A Little Help from another person to put on and taking off regular upper body clothing?: None Help from another person to put on and taking off regular lower body clothing?: A Little 6 Click Score: 20   End of Session Equipment Utilized During Treatment: Gait belt Nurse Communication: Mobility status  Activity Tolerance: Patient tolerated treatment well Patient left: in chair;with call bell/phone within reach;with chair alarm set  OT Visit Diagnosis: Unsteadiness  on feet (R26.81);Other symptoms and signs involving the nervous  system (R29.898)                Time: 0092-3300 OT Time Calculation (min): 32 min Charges:  OT General Charges $OT Visit: 1 Visit OT Evaluation $OT Eval Low Complexity: 1 Low OT Treatments $Self Care/Home Management : 8-22 mins  Auther Lyerly OTR/L acute rehab services Office: [phone]  Toula Moos Truth Wolaver 10/07/2020, 11:28 AM

## 2020-10-07 NOTE — Progress Notes (Signed)
Dr. Myna Hidalgo at pt bedside at this time.

## 2020-10-07 NOTE — Progress Notes (Signed)
Cross-coverage note:   Patient was seen for question of atrial fibrillation on monitor. EKG was obtained and placed in her paper chart, looks like atrial flutter with rate 100.   She presented 12/17 with confusion and was found to have bilateral posterior circulation strokes on MRI.   There was no significant valvular disease noted on echo yesterday and LA size was normal.   She has reported hx of GIB on ASA and was started on Plavix by stroke team.   Patient reports that she had hematemesis while on aspirin, believes that was more than 10 years ago, and has not had any problems with bleeding since.   Platelet count was 100k in ED, had previously been normal and has not yet been repeated.   CHADS-VASc is 3 (age x2, CVA x2, HTN, gender). HAS-BLED score is 5.   Discussed the atrial arrhythmia and associated stroke risk with patient, also discussed the risk of bleeding with anticoagulation, and she is agreeable to starting anticoagulation.   Plan to discuss with neurology.

## 2020-10-07 NOTE — Discharge Summary (Signed)
[facility] Discharge Summary   Patient: Tricia Newman FBP:[id number]  PCP: [name], [name], MD  Date of admission: 10/05/2020   Date of discharge: 10/07/2020      Discharge Diagnoses:  Principal diagnosis CVA   Active Problems:   CVA (cerebral vascular accident) [facility])   Admitted From: home Disposition:  Home with home health  Recommendations for Outpatient Follow-up:  1. PCP: please follow up with PCP and Neurology as recommended 2. Follow up LABS/TEST:  none   Follow-up Information    [name], [name], MD. Schedule an appointment as soon as possible for a visit in 1 week(s).   Specialty: Family Medicine Contact information: [place] [address] [phone]        [facility]. Schedule an appointment as soon as possible for a visit in 2 month(s).   Specialty: Neurology Contact information: [address] [place], [place] [facility] Follow up.   Specialty: Home Health Services Why: The home health agency will contact you for the first home visit. Contact information: [place] [address] [phone]              Discharge Instructions    Ambulatory referral to Neurology   Complete by: As directed    An appointment is requested in approximately: 8 weeks   Diet - low sodium heart healthy   Complete by: As directed    Increase activity slowly   Complete by: As directed       Diet recommendation: Cardiac diet  Activity: The patient is advised to gradually reintroduce usual activities, as tolerated  Discharge Condition: stable  Code Status: Full code   History of present illness: As per the H and P dictated on admission, ": Tricia Newman is a 79 y.o. female with medical history significant of HTN, migraines, chronic back pain, presented with change in mental status.  Patient son reported patient baseline  has no dementia and patient lives by herself able to take care of self.  This morning, patient was noted sitting in the hallway very confused not remember why she was there and her gait was very verbally.  Patient was very confused at time I interviewed her, kept repeating "How come I do not remember anything, I can not remember my doctor's name!"  Patient does remember she takes her BP meds once a day in the evening.  She denied any headache, no numbness or weakness of her limbs. ED Course: MRI concern about bilateral thalamus acute infarct.  CT angiogram showed narrowing of PICA and left P2 branch of PCA and severe right PCA stenosis. "  Hospital Course:  Summary of her active problems in the hospital is as following.   1.  Bilateral thalamic  Atrial flutter Unknown etiology. CT head chronic changes. MRI brain bilateral posterior circulation infarct. CTA posterior circulation stenosis. Echocardiogram shows preserved EF no WMA Currently on aspirin.  Tele shows atrial flutter Will add apixaban instead of aspirin. Currently rate controlled.  LDL 99 on Lipitor.  2.  Essential hypertension Allowing permissive hypertension. Currently holding home medication.  3.  Migraine Continue to monitor  4.  Thrombocytopenia Etiology not clear but functional continue to monitor.  5.  Chronic muscle spasm On Zanaflex as needed  Patient was seen by physical therapy, who recommended Home Health, . On the day of the discharge the patient's vitals were stable, and  no other acute medical condition were reported by patient. The patient was felt safe to be discharge at Home with Home health.  Consultants: Neurology  Procedures: Echocardiogram   DISCHARGE MEDICATION: Allergies as of 10/07/2020   No Known Allergies     Medication List    TAKE these medications   apixaban 5 MG Tabs tablet Commonly known as: ELIQUIS Take 1 tablet (5 mg total) by mouth 2 (two) times daily.   atorvastatin 40  MG tablet Commonly known as: LIPITOR Take 1 tablet (40 mg total) by mouth daily.   benazepril 10 MG tablet Commonly known as: LOTENSIN TAKE 1 TABLET DAILY   CALCIUM 600 + D PO Take 1 tablet by mouth daily.   CENTRUM SILVER PO Take 1 tablet by mouth daily.   fish oil-omega-3 fatty acids 1000 MG capsule Take 2 g by mouth 2 (two) times daily.   loratadine 10 MG tablet Commonly known as: CLARITIN Take 10 mg by mouth daily.   tiZANidine 4 MG tablet Commonly known as: Zanaflex Take 0.5-1 tablets (2-4 mg total) by mouth every 12 (twelve) hours as needed for muscle spasms. Please establish with a new provider for further refills   traMADol 50 MG tablet Commonly known as: ULTRAM TAKE 1 TABLET BY MOUTH AT BEDTIME AS NEEDED FOR PAIN       Discharge Exam: Filed Weights   10/05/20 1112  Weight: 64 kg   Vitals:   10/07/20 0856 10/07/20 1321  BP: 137/77 137/71  Pulse: 68 78  Resp: 18 18  Temp: 97.6 F (36.4 C) 98.5 F (36.9 C)  SpO2: 97% 99%   General: Appear in no distress, no Rash; Oral Mucosa Clear, moist. no Abnormal Neck Mass Or lumps, Conjunctiva normal  Cardiovascular: S1 and S2 Present, no Murmur Respiratory: good respiratory effort, Bilateral Air entry present and CTA, no Crackles, no wheezes Abdomen: Bowel Sound present, Soft and no tenderness Extremities: no Pedal edema Neurology: alert and oriented to time, place, and person affect appropriate. no new focal deficit  The results of significant diagnostics from this hospitalization (including imaging, microbiology, ancillary and laboratory) are listed below for reference.    Significant Diagnostic Studies: CT Angio Head W or Wo Contrast  Result Date: 10/05/2020 CLINICAL DATA:  Neuro deficit, acute, stroke suspected. EXAM: CT ANGIOGRAPHY HEAD AND NECK TECHNIQUE: Multidetector CT imaging of the head and neck was performed using the standard protocol during bolus administration of intravenous contrast. Multiplanar  CT image reconstructions and MIPs were obtained to evaluate the vascular anatomy. Carotid stenosis measurements (when applicable) are obtained utilizing NASCET criteria, using the distal internal carotid diameter as the denominator. CONTRAST:  75mL OMNIPAQUE IOHEXOL 350 MG/ML SOLN COMPARISON:  Brain MRI performed earlier today 10/05/2020. Head CT 10/05/2020. FINDINGS: CTA NECK FINDINGS Aortic arch: Standard aortic branching. Atherosclerotic plaque within the visualized aortic arch and proximal major branch vessels of the neck. No hemodynamically significant innominate or proximal subclavian artery stenosis. Right carotid system: CCA and ICA patent within the neck without stenosis. No significant atherosclerotic disease. Carotid web within the right carotid bulb (series 9, image 60). Left carotid system: CCA and ICA patent within the neck without stenosis. No significant atherosclerotic disease. Vertebral arteries: Patent within the neck without stenosis. The left vertebral artery is dominant. The right vertebral artery is developmentally diminutive. Skeleton: No acute bony abnormality or aggressive osseous lesion. Reversal of the expected cervical lordosis. C3-C4 and C4-C5 grade 1 anterolisthesis. Other neck: No neck mass or cervical lymphadenopathy. Subcentimeter thyroid  nodules not meeting consensus criteria for ultrasound follow-up. Upper chest: No consolidation within the imaged lung apices. Review of the MIP images confirms the above findings CTA HEAD FINDINGS Anterior circulation: The intracranial internal carotid arteries are patent. Calcified plaque within both vessels with no more than mild stenosis. The M1 middle cerebral arteries are patent. No M2 proximal branch occlusion or high-grade proximal stenosis is identified. The anterior cerebral arteries are patent. No intracranial aneurysm is identified. Posterior circulation: Diminutive appearance of the intracranial right vertebral artery beyond the right  PICA origin, and of the proximal right PICA. The dominant intracranial left vertebral artery is patent without stenosis, as is the basilar artery. Apparent 6 mm flow gap within the P2 left posterior cerebral artery. (Series 10, image 21). Severe focal stenosis within the right PCA at the P2/P3 junction (series 10, image 22) (series 12, image 15). Posterior communicating arteries are hypoplastic or absent bilaterally. Venous sinuses: Within the limitations of contrast timing, no convincing thrombus. Anatomic variants: As described Review of the MIP images confirms the above findings These results were called by telephone at the time of interpretation on 10/05/2020 at 5:1o pm to provider Dr. Curly Shores, who verbally acknowledged these results. IMPRESSION: CTA neck: 1. The bilateral common and internal carotid arteries are patent within the neck without stenosis or significant atherosclerotic disease. 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 2. Vertebral arteries patent within the neck without stenosis. The left vertebral artery is significantly dominant. The right vertebral artery is developmentally diminutive. CTA head: 1. Diminutive appearance of the non-dominant intracranial right vertebral artery beyond the right PICA origin, and of the proximal right PICA. Findings are indeterminate in etiology and could be secondary to developmentally small vessel size or atherosclerotic stenosis. Dissection is difficult to definitively exclude, but this would be an atypical location. 2. 6 mm flow gap within the P2 left posterior cerebral artery. This may reflect occlusion with distal reconstitution or severe stenosis. 3. Severe right PCA stenosis at the P2/P3 junction. Electronically Signed   By: Kellie Simmering DO   On: 10/05/2020 17:39   DG Chest 2 View  Result Date: 10/05/2020 CLINICAL DATA:  Altered mental status and nausea EXAM: CHEST - 2 VIEW COMPARISON:  None. FINDINGS: Cardiac shadow is mildly enlarged in size but  accentuated by the frontal technique. Aortic calcifications are noted. The lungs are clear. Mild degenerative changes of the thoracolumbar spine are noted. IMPRESSION: No acute abnormality noted. Electronically Signed   By: Inez Catalina M.D.   On: 10/05/2020 11:42   CT Head Wo Contrast  Result Date: 10/05/2020 CLINICAL DATA:  Altered mental status EXAM: CT HEAD WITHOUT CONTRAST TECHNIQUE: Contiguous axial images were obtained from the base of the skull through the vertex without intravenous contrast. COMPARISON:  None. FINDINGS: Brain: No evidence of acute infarction, hemorrhage, hydrocephalus, extra-axial collection or mass lesion/mass effect. Mild atrophic and chronic white matter ischemic changes are noted. Vascular: No hyperdense vessel or unexpected calcification. Skull: Normal. Negative for fracture or focal lesion. Sinuses/Orbits: No acute finding. Other: None. IMPRESSION: Chronic atrophic and ischemic changes without acute abnormality. Electronically Signed   By: Inez Catalina M.D.   On: 10/05/2020 12:37   CT Angio Neck W and/or Wo Contrast  Result Date: 10/05/2020 CLINICAL DATA:  Neuro deficit, acute, stroke suspected. EXAM: CT ANGIOGRAPHY HEAD AND NECK TECHNIQUE: Multidetector CT imaging of the head and neck was performed using the standard protocol during bolus administration of intravenous contrast. Multiplanar CT image reconstructions and MIPs were  obtained to evaluate the vascular anatomy. Carotid stenosis measurements (when applicable) are obtained utilizing NASCET criteria, using the distal internal carotid diameter as the denominator. CONTRAST:  14mL OMNIPAQUE IOHEXOL 350 MG/ML SOLN COMPARISON:  Brain MRI performed earlier today 10/05/2020. Head CT 10/05/2020. FINDINGS: CTA NECK FINDINGS Aortic arch: Standard aortic branching. Atherosclerotic plaque within the visualized aortic arch and proximal major branch vessels of the neck. No hemodynamically significant innominate or proximal  subclavian artery stenosis. Right carotid system: CCA and ICA patent within the neck without stenosis. No significant atherosclerotic disease. Carotid web within the right carotid bulb (series 9, image 60). Left carotid system: CCA and ICA patent within the neck without stenosis. No significant atherosclerotic disease. Vertebral arteries: Patent within the neck without stenosis. The left vertebral artery is dominant. The right vertebral artery is developmentally diminutive. Skeleton: No acute bony abnormality or aggressive osseous lesion. Reversal of the expected cervical lordosis. C3-C4 and C4-C5 grade 1 anterolisthesis. Other neck: No neck mass or cervical lymphadenopathy. Subcentimeter thyroid nodules not meeting consensus criteria for ultrasound follow-up. Upper chest: No consolidation within the imaged lung apices. Review of the MIP images confirms the above findings CTA HEAD FINDINGS Anterior circulation: The intracranial internal carotid arteries are patent. Calcified plaque within both vessels with no more than mild stenosis. The M1 middle cerebral arteries are patent. No M2 proximal branch occlusion or high-grade proximal stenosis is identified. The anterior cerebral arteries are patent. No intracranial aneurysm is identified. Posterior circulation: Diminutive appearance of the intracranial right vertebral artery beyond the right PICA origin, and of the proximal right PICA. The dominant intracranial left vertebral artery is patent without stenosis, as is the basilar artery. Apparent 6 mm flow gap within the P2 left posterior cerebral artery. (Series 10, image 21). Severe focal stenosis within the right PCA at the P2/P3 junction (series 10, image 22) (series 12, image 15). Posterior communicating arteries are hypoplastic or absent bilaterally. Venous sinuses: Within the limitations of contrast timing, no convincing thrombus. Anatomic variants: As described Review of the MIP images confirms the above findings  These results were called by telephone at the time of interpretation on 10/05/2020 at 5:1o pm to provider Dr. Curly Shores, who verbally acknowledged these results. IMPRESSION: CTA neck: 1. The bilateral common and internal carotid arteries are patent within the neck without stenosis or significant atherosclerotic disease. 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 2. Vertebral arteries patent within the neck without stenosis. The left vertebral artery is significantly dominant. The right vertebral artery is developmentally diminutive. CTA head: 1. Diminutive appearance of the non-dominant intracranial right vertebral artery beyond the right PICA origin, and of the proximal right PICA. Findings are indeterminate in etiology and could be secondary to developmentally small vessel size or atherosclerotic stenosis. Dissection is difficult to definitively exclude, but this would be an atypical location. 2. 6 mm flow gap within the P2 left posterior cerebral artery. This may reflect occlusion with distal reconstitution or severe stenosis. 3. Severe right PCA stenosis at the P2/P3 junction. Electronically Signed   By: Kellie Simmering DO   On: 10/05/2020 17:39   MR BRAIN WO CONTRAST  Result Date: 10/05/2020 CLINICAL DATA:  Acute neuro deficit EXAM: MRI HEAD WITHOUT CONTRAST TECHNIQUE: Multiplanar, multiecho pulse sequences of the brain and surrounding structures were obtained without intravenous contrast. COMPARISON:  CT head 10/05/2020 FINDINGS: Brain: Restricted diffusion in the tail of the hippocampus on the right. Small areas of restricted diffusion in the thalamus bilaterally most notably in the left posterior  thalamus. Midbrain spared. No supratentorial acute infarct. Generalized atrophy. Patchy deep white matter hyperintensities bilaterally, mild-to-moderate in degree. Negative for hemorrhage or mass. Vascular: Normal arterial flow voids Skull and upper cervical spine: No focal skeletal lesion. Sinuses/Orbits: Paranasal  sinuses clear. Bilateral cataract extraction Other: None IMPRESSION: Patchy areas of restricted diffusion in the thalamus bilaterally left greater than right, and in the tail the hippocampus on the right. Midbrain spared. This is consistent with posterior circulation infarct. Consider distal basilar thrombus or artery of [place] incomplete infarction. CT angio head and neck recommended for further evaluation. Chronic microvascular ischemic change in the white matter. Electronically Signed   By: Franchot Gallo M.D.   On: 10/05/2020 16:18   ECHOCARDIOGRAM COMPLETE BUBBLE STUDY  Result Date: 10/06/2020    ECHOCARDIOGRAM REPORT   Patient Name:   Tricia Newman [facility] Date of Exam: 10/06/2020 Medical Rec #:  [id number]         Height:       61.0 in Accession #:    [id number]        Weight:       141.1 lb Date of Birth:  1940-11-28         BSA:          1.628 m Patient Age:    79 years          BP:           173/82 mmHg Patient Gender: F                 HR:           76 bpm. Exam Location:  Inpatient Procedure: 2D Echo Indications:    stroke  History:        Patient has no prior history of Echocardiogram examinations.                 Signs/Symptoms:Altered Mental Status; Risk Factors:Hypertension.  Sonographer:    Johny Chess Referring Phys: [id number] [place]  1. Left ventricular ejection fraction, by estimation, is 60 to 65%. The left ventricle has normal function. The left ventricle has no regional wall motion abnormalities. Left ventricular diastolic parameters were normal.  2. Right ventricular systolic function is normal. The right ventricular size is normal. There is normal pulmonary artery systolic pressure.  3. The mitral valve is normal in structure. Trivial mitral valve regurgitation. No evidence of mitral stenosis.  4. The aortic valve is tricuspid. Aortic valve regurgitation is not visualized. No aortic stenosis is present.  5. The inferior vena cava is normal in size with greater  than 50% respiratory variability, suggesting right atrial pressure of 3 mmHg.  6. Agitated saline contrast bubble study was negative, with no evidence of any interatrial shunt. FINDINGS  Left Ventricle: Left ventricular ejection fraction, by estimation, is 60 to 65%. The left ventricle has normal function. The left ventricle has no regional wall motion abnormalities. The left ventricular internal cavity size was normal in size. There is  no left ventricular hypertrophy. Left ventricular diastolic parameters were normal. Right Ventricle: The right ventricular size is normal. No increase in right ventricular wall thickness. Right ventricular systolic function is normal. There is normal pulmonary artery systolic pressure. The tricuspid regurgitant velocity is 2.73 m/s, and  with an assumed right atrial pressure of 3 mmHg, the estimated right ventricular systolic pressure is 62.8 mmHg. Left Atrium: Left atrial size was normal in size. Right Atrium: Right atrial size was normal in size. Pericardium: There is  no evidence of pericardial effusion. Mitral Valve: The mitral valve is normal in structure. Trivial mitral valve regurgitation. No evidence of mitral valve stenosis. Tricuspid Valve: The tricuspid valve is normal in structure. Tricuspid valve regurgitation is mild . No evidence of tricuspid stenosis. Aortic Valve: The aortic valve is tricuspid. Aortic valve regurgitation is not visualized. No aortic stenosis is present. Aortic valve mean gradient measures 2.3 mmHg. Aortic valve peak gradient measures 4.6 mmHg. Aortic valve area, by VTI measures 2.60 cm. Pulmonic Valve: The pulmonic valve was not well visualized. Pulmonic valve regurgitation is not visualized. No evidence of pulmonic stenosis. Aorta: The aortic root is normal in size and structure. Pulmonary Artery: Borderline pulmonary HTN, PASP is 30 mmHg.  Venous: The inferior vena cava is normal in size with greater than 50% respiratory variability, suggesting  right atrial pressure of 3 mmHg. IAS/Shunts: No atrial level shunt detected by color flow Doppler. Agitated saline contrast was given intravenously to evaluate for intracardiac shunting. Agitated saline contrast bubble study was negative, with no evidence of any interatrial shunt.  LEFT VENTRICLE PLAX 2D LVIDd:         4.20 cm  Diastology LVIDs:         2.90 cm  LV e' medial:    9.36 cm/s LV PW:         1.00 cm  LV E/e' medial:  11.2 LV IVS:        0.80 cm  LV e' lateral:   11.70 cm/s LVOT diam:     1.90 cm  LV E/e' lateral: 9.0 LV SV:         59 LV SV Index:   36 LVOT Area:     2.84 cm  RIGHT VENTRICLE             IVC RV S prime:     14.40 cm/s  IVC diam: 1.70 cm TAPSE (M-mode): 2.4 cm LEFT ATRIUM           Index       RIGHT ATRIUM           Index LA diam:      3.60 cm 2.21 cm/m  RA Area:     13.30 cm LA Vol (A4C): 37.2 ml 22.84 ml/m RA Volume:   30.00 ml  18.42 ml/m  AORTIC VALVE AV Area (Vmax):    2.57 cm AV Area (Vmean):   2.61 cm AV Area (VTI):     2.60 cm AV Vmax:           107.23 cm/s AV Vmean:          72.795 cm/s AV VTI:            0.228 m AV Peak Grad:      4.6 mmHg AV Mean Grad:      2.3 mmHg LVOT Vmax:         97.16 cm/s LVOT Vmean:        66.947 cm/s LVOT VTI:          0.210 m LVOT/AV VTI ratio: 0.92  AORTA Ao Root diam: 3.00 cm Ao Asc diam:  3.40 cm MITRAL VALVE                TRICUSPID VALVE MV Area (PHT): 4.80 cm     TR Peak grad:   29.8 mmHg MV Decel Time: 158 msec     TR Vmax:        273.00 cm/s MV E velocity: 105.00 cm/s MV  A velocity: 97.70 cm/s   SHUNTS MV E/A ratio:  1.07         Systemic VTI:  0.21 m                             Systemic Diam: 1.90 cm Carlyle Dolly MD Electronically signed by Carlyle Dolly MD Signature Date/Time: 10/06/2020/4:13:12 PM    Final     Microbiology: Recent Results (from the past 240 hour(s))  Resp Panel by RT-PCR (Flu A&B, Covid) Nasopharyngeal Swab     Status: None   Collection Time: 10/05/20  7:06 PM   Specimen: Nasopharyngeal Swab;  Nasopharyngeal(NP) swabs in vial transport medium  Result Value Ref Range Status   SARS Coronavirus 2 by RT PCR NEGATIVE NEGATIVE Final    Comment: (NOTE) SARS-CoV-2 target nucleic acids are NOT DETECTED.  The SARS-CoV-2 RNA is generally detectable in upper respiratory specimens during the acute phase of infection. The lowest concentration of SARS-CoV-2 viral copies this assay can detect is 138 copies/mL. A negative result does not preclude SARS-Cov-2 infection and should not be used as the sole basis for treatment or other patient management decisions. A negative result may occur with  improper specimen collection/handling, submission of specimen other than nasopharyngeal swab, presence of viral mutation(s) within the areas targeted by this assay, and inadequate number of viral copies(<138 copies/mL). A negative result must be combined with clinical observations, patient history, and epidemiological information. The expected result is Negative.  Fact Sheet for Patients:  EntrepreneurPulse.com.au  Fact Sheet for Healthcare Providers:  IncredibleEmployment.be  This test is no t yet approved or cleared by the [place] FDA and  has been authorized for detection and/or diagnosis of SARS-CoV-2 by FDA under an Emergency Use Authorization (EUA). This EUA will remain  in effect (meaning this test can be used) for the duration of the COVID-19 declaration under Section 564(b)(1) of the Act, 21 U.S.C.section 360bbb-3(b)(1), unless the authorization is terminated  or revoked sooner.       Influenza A by PCR NEGATIVE NEGATIVE Final   Influenza B by PCR NEGATIVE NEGATIVE Final    Comment: (NOTE) The Xpert Xpress SARS-CoV-2/FLU/RSV plus assay is intended as an aid in the diagnosis of influenza from Nasopharyngeal swab specimens and should not be used as a sole basis for treatment. Nasal washings and aspirates are unacceptable for Xpert Xpress  SARS-CoV-2/FLU/RSV testing.  Fact Sheet for Patients: EntrepreneurPulse.com.au  Fact Sheet for Healthcare Providers: IncredibleEmployment.be  This test is not yet approved or cleared by the [place] FDA and has been authorized for detection and/or diagnosis of SARS-CoV-2 by FDA under an Emergency Use Authorization (EUA). This EUA will remain in effect (meaning this test can be used) for the duration of the COVID-19 declaration under Section 564(b)(1) of the Act, 21 U.S.C. section 360bbb-3(b)(1), unless the authorization is terminated or revoked.  Performed at [facility], [place] [address] [place], [place] [address]      Labs: CBC: Recent Labs  Lab 10/05/20 1117 10/07/20 0457  WBC 4.2 6.9  NEUTROABS 2.3 5.2  HGB 12.4 13.2  HCT 38.1 39.9  MCV 91.1 88.1  PLT 100* 097*   Basic Metabolic Panel: Recent Labs  Lab 10/05/20 1117  NA 138  K 4.2  CL 105  CO2 22  GLUCOSE 99  BUN 15  CREATININE 0.58  CALCIUM 8.8*   Liver Function Tests: Recent Labs  Lab 10/05/20 1117  AST 33  ALT 20  ALKPHOS 67  BILITOT 0.9  PROT 6.3*  ALBUMIN 3.7   CBG: Recent Labs  Lab 10/05/20 1116 10/07/20 0205  GLUCAP 99 154*    Time spent: 35 minutes  Signed:  Berle Mull  [facility] 10/07/2020 11:56 PM

## 2020-10-07 NOTE — Progress Notes (Signed)
Dr Myna Hidalgo on the floor at this time, did provide updates per the EKG at his request, as well as was able to send UA/culture down at this time as the pt was able to produce a sample. Also provided the pt with prune juice and coffee per request for BM assistance, at which time the pt mentions "This feels like a caffeine belly ache." Asked the pt if she normally drinks coffee daily and she replies "a ton!". Bed alarm on, bed locked and low, siderails x2 per pt request, call light within reach, nonskid socks in place. Pt verbalizes she will call when she is ready for bedpan and has consistently demonstrated appropriate use of the call light during this shift.

## 2020-10-07 NOTE — TOC Transition Note (Signed)
Transition of Care [facility]) - CM/SW Discharge Note   Patient Details  Name: Elzena Muston MRN: [id number] Date of Birth: 1940/11/26  Transition of Care [facility]) CM/SW Contact:  Pollie Friar, RN Phone Number: 10/07/2020, 4:07 PM   Clinical Narrative:    Pt is discharging home with [facility] services through [place].  No DME needs.  Pt provided 30 day free card for Eliquis. Pt to check with her pharmacy on the copays after the first 30 days.  Son at the bedside and will provide transport home.   Final next level of care: Home w Home Health Services Barriers to Discharge: No Barriers Identified   Patient Goals and CMS Choice   CMS Medicare.gov Compare Post Acute Care list provided to:: Patient Choice offered to / list presented to : Patient  Discharge Placement                       Discharge Plan and Services                          HH Arranged: PT,OT [place]: [place] Date [place]: 10/07/20   Representative spoke with at [place]: [place] ([place]) Interventions     Readmission Risk Interventions No flowsheet data found.

## 2020-10-07 NOTE — Progress Notes (Signed)
STROKE TEAM PROGRESS NOTE   HISTORY OF PRESENT ILLNESS (per record) Tricia Newman is a 79 y.o. female with a medical history significant for HTN, migraine headaches, and gastrointestinal bleeding (hematemesis) while on aspirin who presented to [facility] ED via EMS 12/17 for AMS. The most recent report of patient's baseline mental status was from her son who went to dinner with her 3 days ago at her baseline mental status. EMS was called today when neighbors found [name] sitting in the driveway confused and perseverating.  Son reports that at baseline she lives independently, manages her own finances, her medications, enjoys gardening and participates regularly with her preventative medical care On ED arrival she was noted to be confused and MRI brain was obtained.  This revealed concern for bilateral posterior circulation strokes concerning for potential basilar thrombus.  Therefore code stroke was activated and patient was taken emergently for CTA, which cleared her basilar artery.  NIH stroke scale remained 0 LKW: 3 days ago (10/02/2020) tpa given?: no, outside of time window    INTERVAL HISTORY Her family is not at bedside.  Patient says that she feels better but still "not herself".  MRI showed bilateral posterior circulation strokes.    OBJECTIVE Vitals:   10/06/20 2022 10/07/20 0211 10/07/20 0856 10/07/20 1321  BP: (!) 162/81 (!) 150/67 137/77 137/71  Pulse: 96 80 68 78  Resp: 18 20 18 18   Temp: 98 F (36.7 C) 98.9 F (37.2 C) 97.6 F (36.4 C) 98.5 F (36.9 C)  TempSrc: Oral Oral Oral Oral  SpO2: 99% 99% 97% 99%  Weight:      Height:        CBC:  Recent Labs  Lab 10/05/20 1117 10/07/20 0457  WBC 4.2 6.9  NEUTROABS 2.3 5.2  HGB 12.4 13.2  HCT 38.1 39.9  MCV 91.1 88.1  PLT 100* 143*    Basic Metabolic Panel:  Recent Labs  Lab 10/05/20 1117  NA 138  K 4.2  CL 105  CO2 22  GLUCOSE 99  BUN 15  CREATININE 0.58  CALCIUM 8.8*    Lipid Panel:     Component  Value Date/Time   CHOL 183 10/05/2020 1117   TRIG 67 10/05/2020 1117   HDL 71 10/05/2020 1117   CHOLHDL 2.6 10/05/2020 1117   VLDL 13 10/05/2020 1117   LDLCALC 99 10/05/2020 1117   LDLCALC 95 08/01/2020 0754   HgbA1c:  Lab Results  Component Value Date   HGBA1C 4.9 10/05/2020   Urine Drug Screen: No results found for: LABOPIA, COCAINSCRNUR, LABBENZ, AMPHETMU, THCU, LABBARB  Alcohol Level No results found for: ETH  IMAGING  CT Angio Head W or Wo Contrast  CT Angio Neck W and/or Wo Contrast 10/05/2020 IMPRESSION:   CTA neck:  1. The bilateral common and internal carotid arteries are patent within the neck without stenosis or significant atherosclerotic disease. 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  2. Vertebral arteries patent within the neck without stenosis. The left vertebral artery is significantly dominant. The right vertebral artery is developmentally diminutive.   CTA head:  1. Diminutive appearance of the non-dominant intracranial right vertebral artery beyond the right PICA origin, and of the proximal right PICA. Findings are indeterminate in etiology and could be secondary to developmentally small vessel size or atherosclerotic stenosis. Dissection is difficult to definitively exclude, but this would be an atypical location.  2. 6 mm flow gap within the P2 left posterior cerebral artery. This may reflect occlusion with  distal reconstitution or severe stenosis.  3. Severe right PCA stenosis at the P2/P3 junction.    CT Head Wo Contrast 10/05/2020 IMPRESSION:  Chronic atrophic and ischemic changes without acute abnormality.   MR BRAIN WO CONTRAST 10/05/2020 IMPRESSION:  Patchy areas of restricted diffusion in the thalamus bilaterally left greater than right, and in the tail the hippocampus on the right. Midbrain spared. This is consistent with posterior circulation infarct. Consider distal basilar thrombus or artery of Percheron incomplete infarction. CT angio head  and neck recommended for further evaluation. Chronic microvascular ischemic change in the white matter.   DG Chest 2 View 10/05/2020 IMPRESSION:  No acute abnormality noted.  Transthoracic Echocardiogram With Bubble Study 10/06/2020 IMPRESSIONS  1. Left ventricular ejection fraction, by estimation, is 60 to 65%. The  left ventricle has normal function. The left ventricle has no regional  wall motion abnormalities. Left ventricular diastolic parameters were  normal.  2. Right ventricular systolic function is normal. The right ventricular  size is normal. There is normal pulmonary artery systolic pressure.  3. The mitral valve is normal in structure. Trivial mitral valve  regurgitation. No evidence of mitral stenosis.  4. The aortic valve is tricuspid. Aortic valve regurgitation is not  visualized. No aortic stenosis is present.  5. The inferior vena cava is normal in size with greater than 50%  respiratory variability, suggesting right atrial pressure of 3 mmHg.  6. Agitated saline contrast bubble study was negative, with no evidence  of any interatrial shunt.   ECG - SR rate 76 BPM. Computer read as atrial fibrillation but appears to be SR. (See cardiology reading for complete details)  PHYSICAL EXAM Blood pressure 137/71, pulse 78, temperature 98.5 F (36.9 C), temperature source Oral, resp. rate 18, height 5\' 1"  (1.549 m), weight 64 kg, SpO2 99 %.  An elderly white female laying in bed no acute distress, awake and alert, appears to have baseline memory changes, speech is normal without dysarthria or aphasia, naming repetition intact, pupils equally round and reactive to light, visual fields full, extraocular movements intact, facial sensation intact, smile is symmetric, hearing intact to voice, tongue midline without fasciculations, strength is antigravity and symmetric without drift in all extremities, tone is normal, bulk is normal, intact to light touch in all extremities,  coordination and gait deferred.  ASSESSMENT/PLAN Tricia Newman is a 79 y.o. female with history of HTN, migraine headaches, and gastrointestinal bleeding (hematemesis) while on aspirin who presented to [facility] ED via EMS 12/17 after she was found confused and perseverating.  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   MRI head - Patchy areas of restricted diffusion in the thalamus bilaterally left greater than right, and in the tail the hippocampus on the right. Midbrain spared. This is consistent with posterior circulation infarct. Consider distal basilar thrombus or artery of Percheron incomplete infarction. Chronic microvascular ischemic change in the white matter.   MRA head - not ordered  CTA Head -  Diminutive appearance of the non-dominant intracranial right vertebral artery beyond the right PICA origin, and of the proximal right PICA. Findings are indeterminate in etiology and could be secondary to developmentally small vessel size or atherosclerotic stenosis. Dissection is difficult to definitively exclude, but this would be an atypical location. 6  mm flow gap within the P2 left posterior cerebral artery. This may reflect occlusion with distal reconstitution or severe stenosis. Severe right PCA stenosis at the P2/P3 junction.    CTA Neck - The bilateral common and internal carotid arteries are patent within the neck without stenosis or significant atherosclerotic disease. 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 Vertebral arteries patent within the neck without stenosis. The left vertebral artery is significantly dominant. The right vertebral artery is developmentally diminutive.   CT Perfusion - not ordered  Carotid Doppler - CTA neck ordered - carotid dopplers not  indicated.  2D Echo w bubble study - EF 60 - 65%. Agitated saline contrast bubble study was negative, with no evidence of any interatrial shunt.   Sars Corona Virus 2 - negative  LDL - 99  HgbA1c - 4.9  UDS - not ordered VTE prophylaxis - Lovenox 40 mg daily  Diet  Diet Order            Diet - low sodium heart healthy           Diet Heart Room service appropriate? Yes; Fluid consistency: Thin  Diet effective now                 No antithrombotic prior to admission, now on Eliquis  Patient counseled to be compliant with her antithrombotic medications  Ongoing aggressive stroke risk factor management  Therapy recommendations:  Outpt PT and OT  Disposition:  Pending  Hypertension  Home BP meds: Lotensin  Current BP meds: Hydralazine prn  Stable . Permissive hypertension (OK if < 220/120) but gradually normalize in 5-7 days  . Long-term BP goal normotensive  Hyperlipidemia  Home Lipid lowering medication: none   LDL 99, goal < 70  Current lipid lowering medication: none - will add Lipitor 40 mg daily  Continue statin at discharge  Other Stroke Risk Factors  Advanced age  Former cigarette smoker - quit > 40 years ago  Migraines  Other Active Problems, Findings and Recommendations  Code status - Full code  Hx of Gi bleed with ASA (Per Dr Curly [place] - consider adding ASA 81mg  daily later if pt agreeable)  Mild thrombocytopenia - platelets - 100->143  Hospital day # 2  Embolic  Strokes, aflutter, discharged on Eliquis. Personally examined patient and images, and have participated in and made any corrections needed to history, physical, neuro exam,assessment and plan as stated above.  I have personally obtained the history, evaluated lab date, reviewed imaging studies and agree with radiology interpretations.    Sarina Ill, MD Stroke Neurology  I spent 15 minutes of face-to-face and non-face-to-face time with patient. This included prechart review,  lab review, study review, order entry, electronic health record documentation, patient education on the different diagnostic and therapeutic options, counseling and coordination of care, risks and benefits of management, compliance, or risk factor reduction   To contact Stroke Continuity provider, please refer to [email or web address] After hours, contact General Neurology

## 2020-10-07 NOTE — Progress Notes (Signed)
Pt awakens confused and extremely restless and nauseous, diaphoretic and unsure of location and needed slight prompting for the year. VSS (see flowsheet), CBG obtained and this was also WNL, thermostat in the room was set to 80 and the door was shut, so opened the door and turned the thermostat down to approx 68. Pt states "I'm going to throw up, I can feel it coming." and also complains of needing to have a bowel movement. "I think I'll be okay if I can just move my bowels." Provided a bedpan. Also obtained orders from Dr Myna Hidalgo to give 4mg  IV zofran, which was given during this encounter.

## 2020-10-07 NOTE — Discharge Instructions (Signed)

## 2020-10-07 NOTE — Evaluation (Signed)
Speech Language Pathology Evaluation Patient Details Name: Tricia Newman MRN: [id number] DOB: [birth year] Today's Date: 10/07/2020 Time: 1115-1140 SLP Time Calculation (min) (ACUTE ONLY): 25 min  Problem List:  Patient Active Problem List   Diagnosis Date Noted  . CVA (cerebral vascular accident) ([place]) 10/05/2020  . Chronic bilateral low back pain with bilateral sciatica 08/01/2019  . Chronic pain disorder 10/06/2018  . Hyperlipidemia 10/06/2018  . Back pain, chronic 08/02/2012  . SEBACEOUS GLAND DISORDER 07/08/2010  . Essential hypertension 07/21/2008  . ACNE ROSACEA 06/24/2007  . Allergic rhinitis 05/26/2007  . Osteopenia of hip 05/26/2007   Past Medical History:  Past Medical History:  Diagnosis Date  . Allergic rhinitis   . Hypertension   . Migraine headache   . Osteoporosis   . PMS (premenstrual syndrome)   . Rosacea    Past Surgical History:  Past Surgical History:  Procedure Laterality Date  . childbirthx2    . COLONOSCOPY  06/2003   HPI:  79 y.o. female with medical history significant of HTN, migraines, chronic back pain, presented with change in mental status and gait instability, found to have bilateral thalamic infarcts on MRI. Pt also found to be in a-flutter during this admission.   Assessment / Plan / Recommendation Clinical Impression  Patient presents with a moderate cognitive impairment with significantly impaired short term memory/delayed recall. She was not consistently oriented to time, stating year as 2022 at one point, 2021 later on. She was not aware she had a stroke, but was aware of place and day of week (of note, PT reported she did not know day of week during his evaluation just prior to this ST eval). She received a score of 19 out of possible 30 on SLUMS ([place] Mental Status examination), placing her in range of Dementia (1-20). SLP did have some concerns about possible dementia as patient would repeat topics/statements almost  verbatim, would become tangential and cyclical in her conversation. Per chart review, patient's son had reported no cognitive impairments prior to this hospitalization. SLP suspects that patient was functioning fairly well with her routine but concern is for her ability to recall and perform new routines/tasks. (ie: MD wants to start her on blood thinner medication) Patient would benefit from 24 hour supervision, assistance with medication management and administration, as well as short term home health SLP to work with patient and family/caregivers on safety with ADL's.    SLP Assessment  SLP Recommendation/Assessment: All further Speech Lanaguage Pathology  needs can be addressed in the next venue of care SLP Visit Diagnosis: Cognitive communication deficit (R41.841)    Follow Up Recommendations  Home health SLP;Outpatient SLP    Frequency and Duration   N/A        SLP Evaluation Cognition  Overall Cognitive Status: Impaired/Different from baseline Arousal/Alertness: Awake/alert Orientation Level: Oriented to person;Oriented to place;Disoriented to situation;Disoriented to time Attention: Sustained Sustained Attention: Impaired Sustained Attention Impairment: Verbal basic;Verbal complex Memory: Impaired Memory Impairment: Other (comment) (immediate recall of 3/5 words, unable to remember any of the 5 words after delay of 2 minutes) Safety/Judgment: Impaired Comments: Patient does not appear with adequate awareness to her memory/cognitive deficits       Comprehension  Auditory Comprehension Overall Auditory Comprehension: Appears within functional limits for tasks assessed    Expression Expression Primary Mode of Expression: Verbal Verbal Expression Overall Verbal Expression: Appears within functional limits for tasks assessed   Oral / Motor  Oral Motor/Sensory Function Overall Oral Motor/Sensory Function: Within  functional limits Motor Speech Overall Motor Speech: Appears  within functional limits for tasks assessed Respiration: Within functional limits Resonance: Within functional limits Articulation: Within functional limitis Intelligibility: Intelligible Motor Planning: Witnin functional limits   [place], MA, [facility]

## 2020-10-07 NOTE — Progress Notes (Signed)
ANTICOAGULATION CONSULT NOTE - Initial Consult  Pharmacy Consult for apixaban Indication: atrial fibrillation  No Known Allergies  Patient Measurements: Height: 5\' 1"  (154.9 cm) Weight: 64 kg (141 lb 1.5 oz) IBW/kg (Calculated) : 47.8  Vital Signs: Temp: 97.6 F (36.4 C) (12/19 0856) Temp Source: Oral (12/19 0856) BP: 137/77 (12/19 0856) Pulse Rate: 68 (12/19 0856)  Labs: Recent Labs    10/05/20 1117 10/07/20 0457  HGB 12.4 13.2  HCT 38.1 39.9  PLT 100* 143*  CREATININE 0.58  --     Estimated Creatinine Clearance: 48.1 mL/min (by C-G formula based on SCr of 0.58 mg/dL).   Medical History: Past Medical History:  Diagnosis Date  . Allergic rhinitis   . Hypertension   . Migraine headache   . Osteoporosis   . PMS (premenstrual syndrome)   . Rosacea     Medications:  Scheduled:  .  stroke: mapping our early stages of recovery book   Does not apply Once  . apixaban  5 mg Oral BID  . loratadine  10 mg Oral Daily    Assessment: 79 yo female admitted 12/17 for AMS.  MRI on arrival showed bilateral posterior circulation strokes concerning for potential basilar thrombus.  Patient was taken for CTA.  No TPA was given due to patient presenting outside of time frame.  12/19 atrial fibrillation seen on the monitor, does not appear to have a PMH of atrial fibrillation.  After hospitalist discussion with patient, decision was made to initiate anticoagulation.    Initiate apixaban 5mg  BID.  Dose appropriate based on age, renal function, and weight.  Goal of Therapy:  Monitor platelets by anticoagulation protocol: Yes   Plan:  Apixaban 5mg  BID Daily CBC   Dimple Nanas, PharmD PGY-1 Acute Care Pharmacy Resident Office: [phone] 10/07/2020 12:13 PM

## 2020-10-08 ENCOUNTER — Telehealth: Payer: Self-pay | Admitting: Family Medicine

## 2020-10-08 LAB — URINE CULTURE: Culture: 30000 — AB

## 2020-10-08 NOTE — Telephone Encounter (Signed)
Transition Care Management Follow-up Telephone Call  Date of discharge and from where: 10/07/2020 from [facility]   How have you been since you were released from the hospital? Patient states that she has been having back pain and nausea  Any questions or concerns? Yes, has concerns of back pain   Items Reviewed:  Did the pt receive and understand the discharge instructions provided? Yes   Medications obtained and verified? Yes   Other? No   Any new allergies since your discharge? No   Dietary orders reviewed? Yes  Do you have support at home? Yes   Home Care and Equipment/Supplies: Were home health services ordered? yes If so, what is the name of the agency? [facility]   Has the agency set up a time to come to the patient's home? no Were any new equipment or medical supplies ordered?  No What is the name of the medical supply agency? N/A  Were you able to get the supplies/equipment? no Do you have any questions related to the use of the equipment or supplies? No  Functional Questionnaire: (I = Independent and D = Dependent) ADLs: I with assistance  Bathing/Dressing- I  Meal Prep- D  Eating- I  Maintaining continence- I  Transferring/Ambulation- I  Managing Meds- I  Follow up appointments reviewed:   PCP Hospital f/u appt confirmed? Yes  Scheduled to see Dr. [name]  on 10/09/2020 @ 3:30 PM .  [facility] f/u appt confirmed? No    Are transportation arrangements needed? No   If their condition worsens, is the pt aware to call PCP or go to the Emergency Dept.? Yes  Was the patient provided with contact information for the PCP's office or ED? Yes  Was to pt encouraged to call back with questions or concerns? Yes

## 2020-10-09 ENCOUNTER — Ambulatory Visit (INDEPENDENT_AMBULATORY_CARE_PROVIDER_SITE_OTHER): Payer: Medicare Other | Admitting: Family Medicine

## 2020-10-09 ENCOUNTER — Encounter: Payer: Self-pay | Admitting: Family Medicine

## 2020-10-09 ENCOUNTER — Other Ambulatory Visit: Payer: Self-pay

## 2020-10-09 VITALS — BP 120/62 | HR 88 | Temp 98.4°F | Resp 16 | Ht 61.0 in | Wt 143.2 lb

## 2020-10-09 DIAGNOSIS — I69311 Memory deficit following cerebral infarction: Secondary | ICD-10-CM | POA: Diagnosis not present

## 2020-10-09 DIAGNOSIS — I4892 Unspecified atrial flutter: Secondary | ICD-10-CM

## 2020-10-09 DIAGNOSIS — R6889 Other general symptoms and signs: Secondary | ICD-10-CM | POA: Diagnosis not present

## 2020-10-09 DIAGNOSIS — I10 Essential (primary) hypertension: Secondary | ICD-10-CM | POA: Diagnosis not present

## 2020-10-09 DIAGNOSIS — E785 Hyperlipidemia, unspecified: Secondary | ICD-10-CM | POA: Diagnosis not present

## 2020-10-09 DIAGNOSIS — D696 Thrombocytopenia, unspecified: Secondary | ICD-10-CM

## 2020-10-09 DIAGNOSIS — I63213 Cerebral infarction due to unspecified occlusion or stenosis of bilateral vertebral arteries: Secondary | ICD-10-CM

## 2020-10-09 DIAGNOSIS — I480 Paroxysmal atrial fibrillation: Secondary | ICD-10-CM | POA: Insufficient documentation

## 2020-10-09 MED ORDER — APIXABAN 5 MG PO TABS: 5.0000 mg | ORAL_TABLET | Freq: Two times a day (BID) | ORAL | 0 refills | Status: DC

## 2020-10-09 NOTE — Progress Notes (Signed)
Chief Complaint  Patient presents with  . Hospitalization Follow-up   HPI:  [name] is a pleasant, right handed 79 y.o. female, who is here today with her son for follow-up recent hospitalization. She was admitted on 10/05/2020 because MS changes and discharged on 10/07/2020 with diagnosis of CVA. Transition of care management follow-up on 10/08/20.  Head CT: Chronic atrophic and ischemic changes without acute abnormality.  Brain MRI: Patchy areas of restricted diffusion in the thalamus bilaterally left greater than right, and in the tail the hippocampus on the right. Midbrain spared. This is consistent with posterior circulation infarct. Consider distal basilar thrombus or artery of Percheron incomplete infarction. CT angio head and neck recommended for further evaluation. Chronic microvascular ischemic change in the white matter.    CT angiogram showed narrowing of PICA and left P2 branch of PCA and severe right PCA stenosis. Atrial flutter was seen on telemetry.  No residual focal weakness, dysarthria, or dysphagia. Some "spiderweb" like flutters in her visual field.  Discharged home on Eliquis 5 mg twice daily. She has tolerated medication well. She is concerned about price of Eliquis, $ 100 per month.  Negative for unusual headache, CP, palpitations, dyspnea, focal weakness, or edema. She is inquiring about when she should be able to drive.  Hypertension: She is currently on benazepril 10 mg daily.  Lab Results  Component Value Date   CREATININE 0.58 10/05/2020   BUN 15 10/05/2020   NA 138 10/05/2020   K 4.2 10/05/2020   CL 105 10/05/2020   CO2 22 10/05/2020   Thrombocytopenia: She has not noted easy bruising, gum bleeding, blood in the stool, or gross hematuria.  Lab Results  Component Value Date   WBC 6.9 10/07/2020   HGB 13.2 10/07/2020   HCT 39.9 10/07/2020   MCV 88.1 10/07/2020   PLT 143 (L) 10/07/2020   HH evaluation has not been  done. Currently she is not doing PT/OT. She still feeling fatigue. Able to walk around the house and do some chores without assistance. Her grandchildren are staying with her.  Her son is concerned about memory issues, even before her stroke.  Is concerned about the beginning of dementia. Sister with history of Alzheimer's.  Review of Systems  Constitutional: Positive for activity change and fatigue. Negative for appetite change and fever.  HENT: Negative for mouth sores and sore throat.   Respiratory: Negative for cough and wheezing.   Gastrointestinal: Negative for abdominal pain, nausea and vomiting.       Negative for changes in bowel habits.  Genitourinary: Negative for decreased urine volume, dysuria and hematuria.  Musculoskeletal: Positive for back pain.  Neurological: Negative for syncope and facial asymmetry.  Psychiatric/Behavioral: Negative for agitation and hallucinations.  Rest of ROS, see pertinent positives sand negatives in HPI   Current Outpatient Medications on File Prior to Visit  Medication Sig Dispense Refill  . atorvastatin (LIPITOR) 40 MG tablet Take 1 tablet (40 mg total) by mouth daily. 30 tablet 0  . benazepril (LOTENSIN) 10 MG tablet TAKE 1 TABLET DAILY 90 tablet 3  . Calcium Carbonate-Vitamin D (CALCIUM 600 + D PO) Take 1 tablet by mouth daily.    . fish oil-omega-3 fatty acids 1000 MG capsule Take 2 g by mouth 2 (two) times daily.    Marland Kitchen loratadine (CLARITIN) 10 MG tablet Take 10 mg by mouth daily.    . Multiple Vitamins-Minerals (CENTRUM SILVER PO) Take 1 tablet by mouth daily.    Marland Kitchen  tiZANidine (ZANAFLEX) 4 MG tablet Take 0.5-1 tablets (2-4 mg total) by mouth every 12 (twelve) hours as needed for muscle spasms. Please establish with a new provider for further refills 60 tablet 1  . traMADol (ULTRAM) 50 MG tablet TAKE 1 TABLET BY MOUTH AT BEDTIME AS NEEDED FOR PAIN 30 tablet 1   No current facility-administered medications on file prior to visit.      Past Medical History:  Diagnosis Date  . Allergic rhinitis   . Hypertension   . Migraine headache   . Osteoporosis   . PMS (premenstrual syndrome)   . Rosacea    No Known Allergies  Social History   Socioeconomic History  . Marital status: Widowed    Spouse name: Not on file  . Number of children: Not on file  . Years of education: Not on file  . Highest education level: Not on file  Occupational History  . Not on file  Tobacco Use  . Smoking status: Former Smoker    Quit date: 10/20/1976    Years since quitting: 44.0  . Smokeless tobacco: Never Used  Substance and Sexual Activity  . Alcohol use: No  . Drug use: No  . Sexual activity: Not on file  Other Topics Concern  . Not on file  Social History Narrative  . Not on file   Social Determinants of Health   Financial Resource Strain: Not on file  Food Insecurity: Not on file  Transportation Needs: Not on file  Physical Activity: Not on file  Stress: Not on file  Social Connections: Not on file    Vitals:   10/09/20 1542  BP: 120/62  Pulse: 88  Resp: 16  Temp: 98.4 F (36.9 C)  SpO2: 99%   Body mass index is 27.06 kg/m.  Physical Exam Vitals and nursing note reviewed.  Constitutional:      General: She is not in acute distress.    Appearance: She is well-developed.  HENT:     Head: Normocephalic and atraumatic.     Mouth/Throat:     Mouth: Oropharynx is clear and moist and mucous membranes are normal. Mucous membranes are moist.     Pharynx: Oropharynx is clear.  Eyes:     Conjunctiva/sclera: Conjunctivae normal.     Pupils: Pupils are equal, round, and reactive to light.  Cardiovascular:     Rate and Rhythm: Normal rate. Rhythm irregular.     Pulses:          Dorsalis pedis pulses are 2+ on the right side and 2+ on the left side.     Heart sounds: Murmur (Soft SEM LUSB) heard.    Pulmonary:     Effort: Pulmonary effort is normal. No respiratory distress.     Breath sounds: Normal  breath sounds.  Abdominal:     Palpations: Abdomen is soft. There is no hepatomegaly or mass.     Tenderness: There is no abdominal tenderness.  Musculoskeletal:        General: No edema.  Lymphadenopathy:     Cervical: No cervical adenopathy.  Skin:    General: Skin is warm.     Findings: No erythema or rash.  Neurological:     Mental Status: She is alert and oriented to person, place, and time.     Cranial Nerves: No cranial nerve deficit.     Deep Tendon Reflexes: Strength normal.     Comments: Otherwise stable gait, not assisted.  Psychiatric:  Mood and Affect: Mood and affect normal.     Comments: Well groomed, good eye contact.   ASSESSMENT AND PLAN:  [name] was seen today for hospital follow-up.  Orders Placed This Encounter  Procedures  . Ambulatory referral to Cardiology  . EKG 12-Lead   Essential hypertension BP adequately controlled. Continue benazepril 10 mg daily. Low-salt diet.  -     EKG 12-Lead  Atrial flutter, unspecified type ([place]) EKG today: Atrial flutter present. We discussed some side effects as well as benefits of Eliquis. Instructed to continue Eliquis 5 mg twice daily. Some samples of Eliquis 5 mg given today.  Cerebrovascular accident (CVA) due to bilateral stenosis of vertebral arteries ([place]) Still with some memory issues but gradually getting closer to her baseline. Continue Eliquis 5 mg bid and Atorvastatin. Keep appt with neuro.  Hyperlipidemia, unspecified hyperlipidemia type LDL goal < 70. Continue atorvastatin 40 mg daily.  Thrombocytopenia (HCC) Mild and improving during hospitalization. We will plan on repeating CBC next visit.  Forgetfulness Today oriented x 3. Worse after CVA but happening for sometimes now. Discussed diagnosis, prognosis, and treatment options for Alzheimer's. For now recommend cognitive challenging exercises. She has an appointment with neurologist, recommend her son addressing  this problem during visit.  Return in about 4 weeks (around 11/06/2020) for Thrombocytopenia, HTN.  Jerelene Salaam G. [name], MD  [facility]. [place] office.   A few things to remember from today's visit:   Essential hypertension - Plan: EKG 12-Lead  Irregular heart rate  Atrial flutter, unspecified type [facility]) - Plan: Ambulatory referral to Cardiology  If you need refills please call your pharmacy. Do not use My Chart to request refills or for acute issues that need immediate attention.   No changes today.  Please be sure medication list is accurate. If a new problem present, please set up appointment sooner than planned today.

## 2020-10-09 NOTE — Patient Instructions (Addendum)
A few things to remember from today's visit:   Essential hypertension - Plan: EKG 12-Lead  Irregular heart rate  Atrial flutter, unspecified type [facility]) - Plan: Ambulatory referral to Cardiology  If you need refills please call your pharmacy. Do not use My Chart to request refills or for acute issues that need immediate attention.   No changes today.  Please be sure medication list is accurate. If a new problem present, please set up appointment sooner than planned today.

## 2020-10-10 DIAGNOSIS — R2681 Unsteadiness on feet: Secondary | ICD-10-CM | POA: Diagnosis not present

## 2020-10-10 DIAGNOSIS — Z9181 History of falling: Secondary | ICD-10-CM | POA: Diagnosis not present

## 2020-10-10 DIAGNOSIS — Z87891 Personal history of nicotine dependence: Secondary | ICD-10-CM | POA: Diagnosis not present

## 2020-10-10 DIAGNOSIS — M549 Dorsalgia, unspecified: Secondary | ICD-10-CM | POA: Diagnosis not present

## 2020-10-10 DIAGNOSIS — M810 Age-related osteoporosis without current pathological fracture: Secondary | ICD-10-CM | POA: Diagnosis not present

## 2020-10-10 DIAGNOSIS — I4892 Unspecified atrial flutter: Secondary | ICD-10-CM | POA: Diagnosis not present

## 2020-10-10 DIAGNOSIS — I10 Essential (primary) hypertension: Secondary | ICD-10-CM | POA: Diagnosis not present

## 2020-10-10 DIAGNOSIS — I69398 Other sequelae of cerebral infarction: Secondary | ICD-10-CM | POA: Diagnosis not present

## 2020-10-10 DIAGNOSIS — J309 Allergic rhinitis, unspecified: Secondary | ICD-10-CM | POA: Diagnosis not present

## 2020-10-10 DIAGNOSIS — M170 Bilateral primary osteoarthritis of knee: Secondary | ICD-10-CM | POA: Diagnosis not present

## 2020-10-10 DIAGNOSIS — M62838 Other muscle spasm: Secondary | ICD-10-CM | POA: Diagnosis not present

## 2020-10-10 DIAGNOSIS — G8929 Other chronic pain: Secondary | ICD-10-CM | POA: Diagnosis not present

## 2020-10-10 DIAGNOSIS — E785 Hyperlipidemia, unspecified: Secondary | ICD-10-CM | POA: Diagnosis not present

## 2020-10-10 DIAGNOSIS — M419 Scoliosis, unspecified: Secondary | ICD-10-CM | POA: Diagnosis not present

## 2020-10-10 DIAGNOSIS — D696 Thrombocytopenia, unspecified: Secondary | ICD-10-CM | POA: Diagnosis not present

## 2020-10-10 DIAGNOSIS — G43009 Migraine without aura, not intractable, without status migrainosus: Secondary | ICD-10-CM | POA: Diagnosis not present

## 2020-10-10 DIAGNOSIS — Z7901 Long term (current) use of anticoagulants: Secondary | ICD-10-CM | POA: Diagnosis not present

## 2020-10-10 DIAGNOSIS — R4182 Altered mental status, unspecified: Secondary | ICD-10-CM | POA: Diagnosis not present

## 2020-10-16 ENCOUNTER — Telehealth: Payer: Self-pay | Admitting: Family Medicine

## 2020-10-16 DIAGNOSIS — I4892 Unspecified atrial flutter: Secondary | ICD-10-CM | POA: Diagnosis not present

## 2020-10-16 DIAGNOSIS — R4182 Altered mental status, unspecified: Secondary | ICD-10-CM | POA: Diagnosis not present

## 2020-10-16 DIAGNOSIS — I69398 Other sequelae of cerebral infarction: Secondary | ICD-10-CM | POA: Diagnosis not present

## 2020-10-16 DIAGNOSIS — I10 Essential (primary) hypertension: Secondary | ICD-10-CM | POA: Diagnosis not present

## 2020-10-16 DIAGNOSIS — R2681 Unsteadiness on feet: Secondary | ICD-10-CM | POA: Diagnosis not present

## 2020-10-16 DIAGNOSIS — G43009 Migraine without aura, not intractable, without status migrainosus: Secondary | ICD-10-CM | POA: Diagnosis not present

## 2020-10-16 NOTE — Telephone Encounter (Signed)
Okay for verbal 

## 2020-10-16 NOTE — Telephone Encounter (Signed)
Michelle cis calling and wanted to let the provider know that she has completed patient OT and is requesting orders for a speech evaluation, please advise. CB is [phone]

## 2020-10-16 NOTE — Telephone Encounter (Signed)
VO given.

## 2020-10-16 NOTE — Telephone Encounter (Signed)
It is ok to verbally authorize requested service. Thanks, Tricia Newman

## 2020-10-22 DIAGNOSIS — I69398 Other sequelae of cerebral infarction: Secondary | ICD-10-CM | POA: Diagnosis not present

## 2020-10-22 DIAGNOSIS — R4182 Altered mental status, unspecified: Secondary | ICD-10-CM | POA: Diagnosis not present

## 2020-10-22 DIAGNOSIS — G43009 Migraine without aura, not intractable, without status migrainosus: Secondary | ICD-10-CM | POA: Diagnosis not present

## 2020-10-22 DIAGNOSIS — I10 Essential (primary) hypertension: Secondary | ICD-10-CM | POA: Diagnosis not present

## 2020-10-22 DIAGNOSIS — I4892 Unspecified atrial flutter: Secondary | ICD-10-CM | POA: Diagnosis not present

## 2020-10-22 DIAGNOSIS — R2681 Unsteadiness on feet: Secondary | ICD-10-CM | POA: Diagnosis not present

## 2020-10-23 DIAGNOSIS — R4182 Altered mental status, unspecified: Secondary | ICD-10-CM | POA: Diagnosis not present

## 2020-10-23 DIAGNOSIS — G43009 Migraine without aura, not intractable, without status migrainosus: Secondary | ICD-10-CM | POA: Diagnosis not present

## 2020-10-23 DIAGNOSIS — I10 Essential (primary) hypertension: Secondary | ICD-10-CM | POA: Diagnosis not present

## 2020-10-23 DIAGNOSIS — R2681 Unsteadiness on feet: Secondary | ICD-10-CM | POA: Diagnosis not present

## 2020-10-23 DIAGNOSIS — I69398 Other sequelae of cerebral infarction: Secondary | ICD-10-CM | POA: Diagnosis not present

## 2020-10-23 DIAGNOSIS — I4892 Unspecified atrial flutter: Secondary | ICD-10-CM | POA: Diagnosis not present

## 2020-10-24 DIAGNOSIS — R2681 Unsteadiness on feet: Secondary | ICD-10-CM | POA: Diagnosis not present

## 2020-10-24 DIAGNOSIS — I4892 Unspecified atrial flutter: Secondary | ICD-10-CM | POA: Diagnosis not present

## 2020-10-24 DIAGNOSIS — G43009 Migraine without aura, not intractable, without status migrainosus: Secondary | ICD-10-CM | POA: Diagnosis not present

## 2020-10-24 DIAGNOSIS — I69398 Other sequelae of cerebral infarction: Secondary | ICD-10-CM | POA: Diagnosis not present

## 2020-10-24 DIAGNOSIS — R4182 Altered mental status, unspecified: Secondary | ICD-10-CM | POA: Diagnosis not present

## 2020-10-24 DIAGNOSIS — I10 Essential (primary) hypertension: Secondary | ICD-10-CM | POA: Diagnosis not present

## 2020-10-29 DIAGNOSIS — I69398 Other sequelae of cerebral infarction: Secondary | ICD-10-CM | POA: Diagnosis not present

## 2020-10-29 DIAGNOSIS — I4892 Unspecified atrial flutter: Secondary | ICD-10-CM | POA: Diagnosis not present

## 2020-10-29 DIAGNOSIS — R2681 Unsteadiness on feet: Secondary | ICD-10-CM | POA: Diagnosis not present

## 2020-10-29 DIAGNOSIS — R4182 Altered mental status, unspecified: Secondary | ICD-10-CM | POA: Diagnosis not present

## 2020-10-29 DIAGNOSIS — G43009 Migraine without aura, not intractable, without status migrainosus: Secondary | ICD-10-CM | POA: Diagnosis not present

## 2020-10-29 DIAGNOSIS — I10 Essential (primary) hypertension: Secondary | ICD-10-CM | POA: Diagnosis not present

## 2020-10-30 DIAGNOSIS — I69398 Other sequelae of cerebral infarction: Secondary | ICD-10-CM | POA: Diagnosis not present

## 2020-10-30 DIAGNOSIS — R4182 Altered mental status, unspecified: Secondary | ICD-10-CM | POA: Diagnosis not present

## 2020-10-30 DIAGNOSIS — I10 Essential (primary) hypertension: Secondary | ICD-10-CM | POA: Diagnosis not present

## 2020-10-30 DIAGNOSIS — R2681 Unsteadiness on feet: Secondary | ICD-10-CM | POA: Diagnosis not present

## 2020-10-30 DIAGNOSIS — G43009 Migraine without aura, not intractable, without status migrainosus: Secondary | ICD-10-CM | POA: Diagnosis not present

## 2020-10-30 DIAGNOSIS — I4892 Unspecified atrial flutter: Secondary | ICD-10-CM | POA: Diagnosis not present

## 2020-10-31 DIAGNOSIS — R4182 Altered mental status, unspecified: Secondary | ICD-10-CM | POA: Diagnosis not present

## 2020-10-31 DIAGNOSIS — G43009 Migraine without aura, not intractable, without status migrainosus: Secondary | ICD-10-CM | POA: Diagnosis not present

## 2020-10-31 DIAGNOSIS — I10 Essential (primary) hypertension: Secondary | ICD-10-CM | POA: Diagnosis not present

## 2020-10-31 DIAGNOSIS — R2681 Unsteadiness on feet: Secondary | ICD-10-CM | POA: Diagnosis not present

## 2020-10-31 DIAGNOSIS — I69398 Other sequelae of cerebral infarction: Secondary | ICD-10-CM | POA: Diagnosis not present

## 2020-10-31 DIAGNOSIS — I4892 Unspecified atrial flutter: Secondary | ICD-10-CM | POA: Diagnosis not present

## 2020-11-02 ENCOUNTER — Other Ambulatory Visit: Payer: Self-pay

## 2020-11-02 ENCOUNTER — Ambulatory Visit (INDEPENDENT_AMBULATORY_CARE_PROVIDER_SITE_OTHER): Payer: Medicare Other | Admitting: Family Medicine

## 2020-11-02 ENCOUNTER — Encounter: Payer: Self-pay | Admitting: Family Medicine

## 2020-11-02 VITALS — BP 124/70 | HR 94 | Resp 16 | Ht 61.0 in | Wt 140.4 lb

## 2020-11-02 DIAGNOSIS — E785 Hyperlipidemia, unspecified: Secondary | ICD-10-CM

## 2020-11-02 DIAGNOSIS — I63213 Cerebral infarction due to unspecified occlusion or stenosis of bilateral vertebral arteries: Secondary | ICD-10-CM | POA: Diagnosis not present

## 2020-11-02 DIAGNOSIS — I4892 Unspecified atrial flutter: Secondary | ICD-10-CM | POA: Diagnosis not present

## 2020-11-02 DIAGNOSIS — D696 Thrombocytopenia, unspecified: Secondary | ICD-10-CM

## 2020-11-02 DIAGNOSIS — I10 Essential (primary) hypertension: Secondary | ICD-10-CM | POA: Diagnosis not present

## 2020-11-02 MED ORDER — ATORVASTATIN CALCIUM 40 MG PO TABS: 40.0000 mg | ORAL_TABLET | Freq: Every day | ORAL | 3 refills | Status: DC

## 2020-11-02 MED ORDER — APIXABAN 5 MG PO TABS: 5.0000 mg | ORAL_TABLET | Freq: Two times a day (BID) | ORAL | 0 refills | Status: DC

## 2020-11-02 NOTE — Assessment & Plan Note (Signed)
Continue Atorvastatin 40 mg daily. LDL goal < 70. We will plan on checking FLP next visit, if not checked by cardiologist in 11/2020.

## 2020-11-02 NOTE — Progress Notes (Signed)
HPI: Tricia Newman is a 80 y.o. female, who is here today for follow up.   She was last seen on 10/09/20. CVA 10/05/20.  No significant residual deficit. Gradually feeling better. Brain MRI: Patchy areas of restricted diffusion in the thalamus bilaterally left greater than right, and in the tail the hippocampus on the right. Midbrain spared. This is consistent with posterior circulation infarct. Consider distal basilar thrombus or artery of Percheron incomplete infarction.  Chronic microvascular ischemic change in the white matter.   CT angiogram showed narrowing of PICA and left P2 branch of PCA and severe right PCA stenosis.  Atrial flutter seen on telemetry during hospitalization. She is on Eliquis 5 mg bid. Tolerating medication well.  She has not noted palpitations,CP,dyspnea,or diaphoresis. She is living alone, not yet driving. Independent ADL's.  PT 2 times per week and nurse visit once per week. She is asking if she can start driving locally.  HLD: She is on Atorvastatin 40 mg daily.She does not think her cholesterol is bad enough to take medication.  Lab Results  Component Value Date   CHOL 183 10/05/2020   HDL 71 10/05/2020   LDLCALC 99 10/05/2020   LDLDIRECT 105.7 07/30/2011   TRIG 67 10/05/2020   CHOLHDL 2.6 10/05/2020   HTN: She is on Benazepril 10 mg daily. Negative for severe/frequent headache, visual changes, or edema.  Lab Results  Component Value Date   CREATININE 0.58 10/05/2020   BUN 15 10/05/2020   NA 138 10/05/2020   K 4.2 10/05/2020   CL 105 10/05/2020   CO2 22 10/05/2020   Mild thrombocytopenia: She has not noted nose/gum bleeding,blood in stool, melena,gross hematuria,or more bruising than usual.  Lab Results  Component Value Date   WBC 6.9 10/07/2020   HGB 13.2 10/07/2020   HCT 39.9 10/07/2020   MCV 88.1 10/07/2020   PLT 143 (L) 10/07/2020   Review of Systems  Constitutional: Negative for appetite change and fever.   HENT: Negative for mouth sores.   Respiratory: Negative for cough and wheezing.   Gastrointestinal: Negative for abdominal pain, nausea and vomiting.       Negative for changes in bowel habits.  Genitourinary: Negative for decreased urine volume and dysuria.  Neurological: Negative for syncope and facial asymmetry.  Rest of ROS, see pertinent positives sand negatives in HPI  Current Outpatient Medications on File Prior to Visit  Medication Sig Dispense Refill  . benazepril (LOTENSIN) 10 MG tablet TAKE 1 TABLET DAILY 90 tablet 3  . Calcium Carbonate-Vitamin D (CALCIUM 600 + D PO) Take 1 tablet by mouth daily.    . fish oil-omega-3 fatty acids 1000 MG capsule Take 2 g by mouth 2 (two) times daily.    Marland Kitchen loratadine (CLARITIN) 10 MG tablet Take 10 mg by mouth daily.    . Multiple Vitamins-Minerals (CENTRUM SILVER PO) Take 1 tablet by mouth daily.    Marland Kitchen tiZANidine (ZANAFLEX) 4 MG tablet Take 0.5-1 tablets (2-4 mg total) by mouth every 12 (twelve) hours as needed for muscle spasms. Please establish with a new provider for further refills 60 tablet 1  . traMADol (ULTRAM) 50 MG tablet TAKE 1 TABLET BY MOUTH AT BEDTIME AS NEEDED FOR PAIN 30 tablet 1   No current facility-administered medications on file prior to visit.     Past Medical History:  Diagnosis Date  . Allergic rhinitis   . Hypertension   . Migraine headache   . Osteoporosis   . PMS (premenstrual syndrome)   .  Rosacea    No Known Allergies  Social History   Socioeconomic History  . Marital status: Widowed    Spouse name: Not on file  . Number of children: Not on file  . Years of education: Not on file  . Highest education level: Not on file  Occupational History  . Not on file  Tobacco Use  . Smoking status: Former Smoker    Quit date: 10/20/1976    Years since quitting: 44.0  . Smokeless tobacco: Never Used  Substance and Sexual Activity  . Alcohol use: No  . Drug use: No  . Sexual activity: Not on file  Other  Topics Concern  . Not on file  Social History Narrative  . Not on file   Social Determinants of Health   Financial Resource Strain: Not on file  Food Insecurity: Not on file  Transportation Needs: Not on file  Physical Activity: Not on file  Stress: Not on file  Social Connections: Not on file    Vitals:   11/02/20 1428  BP: 124/70  Pulse: 94  Resp: 16  SpO2: 97%   Body mass index is 26.52 kg/m.   Physical Exam Vitals and nursing note reviewed.  Constitutional:      General: She is not in acute distress.    Appearance: She is well-developed.  HENT:     Head: Normocephalic and atraumatic.     Mouth/Throat:     Mouth: Oropharynx is clear and moist and mucous membranes are normal. Mucous membranes are moist.     Pharynx: Oropharynx is clear.  Eyes:     Conjunctiva/sclera: Conjunctivae normal.  Cardiovascular:     Rate and Rhythm: Normal rate and regular rhythm.     Pulses:          Dorsalis pedis pulses are 2+ on the right side and 2+ on the left side.     Heart sounds: No murmur heard.   Pulmonary:     Effort: Pulmonary effort is normal. No respiratory distress.     Breath sounds: Normal breath sounds.  Abdominal:     Palpations: Abdomen is soft. There is no hepatomegaly or mass.     Tenderness: There is no abdominal tenderness.  Musculoskeletal:        General: No edema.  Lymphadenopathy:     Cervical: No cervical adenopathy.  Skin:    General: Skin is warm.     Findings: No erythema or rash.  Neurological:     Mental Status: She is alert and oriented to person, place, and time.     Cranial Nerves: No cranial nerve deficit.     Gait: Gait normal.     Deep Tendon Reflexes: Strength normal.  Psychiatric:        Mood and Affect: Mood and affect normal.     Comments: Well groomed, good eye contact.    ASSESSMENT AND PLAN:  [name] was seen today for follow-up.  Orders Placed This Encounter  Procedures  . Basic metabolic panel  . CBC    Lab Results  Component Value Date   CREATININE 0.63 11/02/2020   BUN 17 11/02/2020   NA 140 11/02/2020   K 4.2 11/02/2020   CL 105 11/02/2020   CO2 27 11/02/2020  Estimated Creatinine Clearance: 48 mL/min (by C-G formula based on SCr of 0.63 mg/dL).  Lab Results  Component Value Date   WBC 4.3 11/02/2020   HGB 13.2 11/02/2020   HCT 40.2 11/02/2020  MCV 86.8 11/02/2020   PLT 162 11/02/2020   Thrombocytopenia (HCC) MIld. Further recommendations according to CBC results.  Hyperlipidemia Continue Atorvastatin 40 mg daily. LDL goal < 70. We will plan on checking FLP next visit, if not checked by cardiologist in 11/2020.  Essential hypertension BP adequately controlled. Continue Benazepril 10 mg daily.   CVA (cerebral vascular accident) ([place]) No significant residual deficit. I think she could start driving locally and not during busy hours or on high speed roads. But I prefer to follow neuro recommendations.  Atrial flutter (HCC) Today rate and rhythm controlled. Continue Eliquis 5 mg bid. She has an appt with cardiologist in 11/2020.   Return in about 5 months (around 04/02/2021) for HTN,HLD.   Paislea Hatton G. [name], MD  [facility]. [place] office.  A few things to remember from today's visit:   Thrombocytopenia ([place]) - Plan: CBC  Atrial flutter, unspecified type ([place]), Chronic  Hyperlipidemia, unspecified hyperlipidemia type  Essential hypertension - Plan: Basic metabolic panel  If you need refills please call your pharmacy. Do not use My Chart to request refills or for acute issues that need immediate attention.   No changes today. We can check cholesterol next visit, if not yet checked by cardio.  Please be sure medication list is accurate. If a new problem present, please set up appointment sooner than planned today.

## 2020-11-02 NOTE — Assessment & Plan Note (Signed)
Today rate and rhythm controlled. Continue Eliquis 5 mg bid. She has an appt with cardiologist in 11/2020.

## 2020-11-02 NOTE — Patient Instructions (Addendum)
A few things to remember from today's visit:   Thrombocytopenia ([place]) - Plan: CBC  Atrial flutter, unspecified type ([place]), Chronic  Hyperlipidemia, unspecified hyperlipidemia type  Essential hypertension - Plan: Basic metabolic panel  If you need refills please call your pharmacy. Do not use My Chart to request refills or for acute issues that need immediate attention.   No changes today. We can check cholesterol next visit, if not yet checked by cardio.  Please be sure medication list is accurate. If a new problem present, please set up appointment sooner than planned today.

## 2020-11-02 NOTE — Assessment & Plan Note (Signed)
MIld. Further recommendations according to CBC results.

## 2020-11-02 NOTE — Assessment & Plan Note (Signed)
BP adequately controlled. Continue Benazepril 10 mg daily.

## 2020-11-02 NOTE — Assessment & Plan Note (Addendum)
No significant residual deficit. I think she could start driving locally and not during busy hours or on high speed roads. But I prefer to follow neuro recommendations.

## 2020-11-03 LAB — BASIC METABOLIC PANEL
BUN: 17 mg/dL (ref 7–25)
CO2: 27 mmol/L (ref 20–32)
Calcium: 9.4 mg/dL (ref 8.6–10.4)
Chloride: 105 mmol/L (ref 98–110)
Creat: 0.63 mg/dL (ref 0.60–0.88)
Glucose, Bld: 82 mg/dL (ref 65–99)
Potassium: 4.2 mmol/L (ref 3.5–5.3)
Sodium: 140 mmol/L (ref 135–146)

## 2020-11-03 LAB — CBC
HCT: 40.2 % (ref 35.0–45.0)
Hemoglobin: 13.2 g/dL (ref 11.7–15.5)
MCH: 28.5 pg (ref 27.0–33.0)
MCHC: 32.8 g/dL (ref 32.0–36.0)
MCV: 86.8 fL (ref 80.0–100.0)
MPV: 13.1 fL — ABNORMAL HIGH (ref 7.5–12.5)
Platelets: 162 10*3/uL (ref 140–400)
RBC: 4.63 10*6/uL (ref 3.80–5.10)
RDW: 12.8 % (ref 11.0–15.0)
WBC: 4.3 10*3/uL (ref 3.8–10.8)

## 2020-11-07 DIAGNOSIS — I4892 Unspecified atrial flutter: Secondary | ICD-10-CM | POA: Diagnosis not present

## 2020-11-07 DIAGNOSIS — R2681 Unsteadiness on feet: Secondary | ICD-10-CM | POA: Diagnosis not present

## 2020-11-07 DIAGNOSIS — I69398 Other sequelae of cerebral infarction: Secondary | ICD-10-CM | POA: Diagnosis not present

## 2020-11-07 DIAGNOSIS — G43009 Migraine without aura, not intractable, without status migrainosus: Secondary | ICD-10-CM | POA: Diagnosis not present

## 2020-11-07 DIAGNOSIS — R4182 Altered mental status, unspecified: Secondary | ICD-10-CM | POA: Diagnosis not present

## 2020-11-07 DIAGNOSIS — I10 Essential (primary) hypertension: Secondary | ICD-10-CM | POA: Diagnosis not present

## 2020-11-09 DIAGNOSIS — M170 Bilateral primary osteoarthritis of knee: Secondary | ICD-10-CM | POA: Diagnosis not present

## 2020-11-09 DIAGNOSIS — I4892 Unspecified atrial flutter: Secondary | ICD-10-CM | POA: Diagnosis not present

## 2020-11-09 DIAGNOSIS — G8929 Other chronic pain: Secondary | ICD-10-CM | POA: Diagnosis not present

## 2020-11-09 DIAGNOSIS — Z87891 Personal history of nicotine dependence: Secondary | ICD-10-CM | POA: Diagnosis not present

## 2020-11-09 DIAGNOSIS — Z7901 Long term (current) use of anticoagulants: Secondary | ICD-10-CM | POA: Diagnosis not present

## 2020-11-09 DIAGNOSIS — D696 Thrombocytopenia, unspecified: Secondary | ICD-10-CM | POA: Diagnosis not present

## 2020-11-09 DIAGNOSIS — M549 Dorsalgia, unspecified: Secondary | ICD-10-CM | POA: Diagnosis not present

## 2020-11-09 DIAGNOSIS — Z9181 History of falling: Secondary | ICD-10-CM | POA: Diagnosis not present

## 2020-11-09 DIAGNOSIS — R4182 Altered mental status, unspecified: Secondary | ICD-10-CM | POA: Diagnosis not present

## 2020-11-09 DIAGNOSIS — I69398 Other sequelae of cerebral infarction: Secondary | ICD-10-CM | POA: Diagnosis not present

## 2020-11-09 DIAGNOSIS — I10 Essential (primary) hypertension: Secondary | ICD-10-CM | POA: Diagnosis not present

## 2020-11-09 DIAGNOSIS — M810 Age-related osteoporosis without current pathological fracture: Secondary | ICD-10-CM | POA: Diagnosis not present

## 2020-11-09 DIAGNOSIS — J309 Allergic rhinitis, unspecified: Secondary | ICD-10-CM | POA: Diagnosis not present

## 2020-11-09 DIAGNOSIS — M419 Scoliosis, unspecified: Secondary | ICD-10-CM | POA: Diagnosis not present

## 2020-11-09 DIAGNOSIS — R2681 Unsteadiness on feet: Secondary | ICD-10-CM | POA: Diagnosis not present

## 2020-11-09 DIAGNOSIS — E785 Hyperlipidemia, unspecified: Secondary | ICD-10-CM | POA: Diagnosis not present

## 2020-11-09 DIAGNOSIS — G43009 Migraine without aura, not intractable, without status migrainosus: Secondary | ICD-10-CM | POA: Diagnosis not present

## 2020-11-09 DIAGNOSIS — M62838 Other muscle spasm: Secondary | ICD-10-CM | POA: Diagnosis not present

## 2020-11-12 DIAGNOSIS — G43009 Migraine without aura, not intractable, without status migrainosus: Secondary | ICD-10-CM | POA: Diagnosis not present

## 2020-11-12 DIAGNOSIS — R2681 Unsteadiness on feet: Secondary | ICD-10-CM | POA: Diagnosis not present

## 2020-11-12 DIAGNOSIS — I10 Essential (primary) hypertension: Secondary | ICD-10-CM | POA: Diagnosis not present

## 2020-11-12 DIAGNOSIS — I4892 Unspecified atrial flutter: Secondary | ICD-10-CM | POA: Diagnosis not present

## 2020-11-12 DIAGNOSIS — I69398 Other sequelae of cerebral infarction: Secondary | ICD-10-CM | POA: Diagnosis not present

## 2020-11-12 DIAGNOSIS — R4182 Altered mental status, unspecified: Secondary | ICD-10-CM | POA: Diagnosis not present

## 2020-11-13 DIAGNOSIS — R4182 Altered mental status, unspecified: Secondary | ICD-10-CM | POA: Diagnosis not present

## 2020-11-13 DIAGNOSIS — I4892 Unspecified atrial flutter: Secondary | ICD-10-CM | POA: Diagnosis not present

## 2020-11-13 DIAGNOSIS — R2681 Unsteadiness on feet: Secondary | ICD-10-CM | POA: Diagnosis not present

## 2020-11-13 DIAGNOSIS — I10 Essential (primary) hypertension: Secondary | ICD-10-CM | POA: Diagnosis not present

## 2020-11-13 DIAGNOSIS — I69398 Other sequelae of cerebral infarction: Secondary | ICD-10-CM | POA: Diagnosis not present

## 2020-11-13 DIAGNOSIS — G43009 Migraine without aura, not intractable, without status migrainosus: Secondary | ICD-10-CM | POA: Diagnosis not present

## 2020-11-15 DIAGNOSIS — R2681 Unsteadiness on feet: Secondary | ICD-10-CM | POA: Diagnosis not present

## 2020-11-15 DIAGNOSIS — G43009 Migraine without aura, not intractable, without status migrainosus: Secondary | ICD-10-CM | POA: Diagnosis not present

## 2020-11-15 DIAGNOSIS — I10 Essential (primary) hypertension: Secondary | ICD-10-CM | POA: Diagnosis not present

## 2020-11-15 DIAGNOSIS — I4892 Unspecified atrial flutter: Secondary | ICD-10-CM | POA: Diagnosis not present

## 2020-11-15 DIAGNOSIS — I69398 Other sequelae of cerebral infarction: Secondary | ICD-10-CM | POA: Diagnosis not present

## 2020-11-15 DIAGNOSIS — R4182 Altered mental status, unspecified: Secondary | ICD-10-CM | POA: Diagnosis not present

## 2020-11-20 DIAGNOSIS — I10 Essential (primary) hypertension: Secondary | ICD-10-CM | POA: Diagnosis not present

## 2020-11-20 DIAGNOSIS — R4182 Altered mental status, unspecified: Secondary | ICD-10-CM | POA: Diagnosis not present

## 2020-11-20 DIAGNOSIS — I69398 Other sequelae of cerebral infarction: Secondary | ICD-10-CM | POA: Diagnosis not present

## 2020-11-20 DIAGNOSIS — R2681 Unsteadiness on feet: Secondary | ICD-10-CM | POA: Diagnosis not present

## 2020-11-20 DIAGNOSIS — G43009 Migraine without aura, not intractable, without status migrainosus: Secondary | ICD-10-CM | POA: Diagnosis not present

## 2020-11-20 DIAGNOSIS — I4892 Unspecified atrial flutter: Secondary | ICD-10-CM | POA: Diagnosis not present

## 2020-11-26 NOTE — Progress Notes (Signed)
Referring-Tricia [name], MD Reason for referral-atrial fibrillation  HPI: 80 year old female for evaluation of atrial fibrillation at request of Tricia [name], MD.  Patient admitted December 2021 with altered mental status.  MRI consistent with posterior circulation infarct.  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  Echocardiogram December 2021 showed normal LV function.  Patient noted to have paroxysmal atrial fibrillation during hospitalization and apixaban added.  Cardiology now asked to evaluate.  Patient denies dyspnea, chest pain, palpitations or syncope.  She does not fall.  No bleeding.  Current Outpatient Medications  Medication Sig Dispense Refill  . apixaban (ELIQUIS) 5 MG TABS tablet Take 1 tablet (5 mg total) by mouth 2 (two) times daily. 180 tablet 0  . atorvastatin (LIPITOR) 40 MG tablet Take 1 tablet (40 mg total) by mouth daily. 90 tablet 3  . benazepril (LOTENSIN) 10 MG tablet TAKE 1 TABLET DAILY 90 tablet 3  . Calcium Carbonate-Vitamin D (CALCIUM 600 + D PO) Take 1 tablet by mouth daily.    . fish oil-omega-3 fatty acids 1000 MG capsule Take 2 g by mouth 2 (two) times daily.    Marland Kitchen loratadine (CLARITIN) 10 MG tablet Take 10 mg by mouth daily.    . Multiple Vitamins-Minerals (CENTRUM SILVER PO) Take 1 tablet by mouth daily.    Marland Kitchen tiZANidine (ZANAFLEX) 4 MG tablet Take 0.5-1 tablets (2-4 mg total) by mouth every 12 (twelve) hours as needed for muscle spasms. Please establish with a new provider for further refills 60 tablet 1  . traMADol (ULTRAM) 50 MG tablet TAKE 1 TABLET BY MOUTH AT BEDTIME AS NEEDED FOR PAIN 30 tablet 1   No current facility-administered medications for this visit.    No Known Allergies  Past Medical History:  Diagnosis Date  . Allergic rhinitis   . Hypertension   . Migraine headache   . Osteoporosis   . PMS (premenstrual syndrome)   . Rosacea     Past Surgical History:  Procedure  Laterality Date  . childbirthx2    . COLONOSCOPY  06/2003    Social History   Socioeconomic History  . Marital status: Widowed    Spouse name: Not on file  . Number of children: Not on file  . Years of education: Not on file  . Highest education level: Not on file  Occupational History  . Not on file  Tobacco Use  . Smoking status: Former Smoker    Quit date: 10/20/1976    Years since quitting: 44.1  . Smokeless tobacco: Never Used  Substance and Sexual Activity  . Alcohol use: No  . Drug use: No  . Sexual activity: Not on file  Other Topics Concern  . Not on file  Social History Narrative  . Not on file   Social Determinants of Health   Financial Resource Strain: Not on file  Food Insecurity: Not on file  Transportation Needs: Not on file  Physical Activity: Not on file  Stress: Not on file  Social Connections: Not on file  Intimate Partner Violence: Not on file    Family History  Problem Relation Age of Onset  . Colon polyps Sister   . Colon polyps Brother   . Breast cancer Maternal Aunt   . Colon cancer Neg Hx     ROS: Fatigue and depression but no fevers or chills, productive cough, hemoptysis, dysphasia, odynophagia, melena, hematochezia, dysuria, hematuria, rash, seizure activity, orthopnea, PND, pedal edema, claudication.  Remaining systems are negative.  Physical Exam:   There were no vitals taken for this visit.  General:  Well developed/well nourished in NAD Skin warm/dry Patient not depressed No peripheral clubbing Back-normal HEENT-normal/normal eyelids Neck supple/normal carotid upstroke bilaterally; no bruits; no JVD; no thyromegaly chest - CTA/ normal expansion CV - RRR/normal S1 and S2; no murmurs, rubs or gallops;  PMI nondisplaced Abdomen -NT/ND, no HSM, no mass, + bowel sounds, no bruit 2+ femoral pulses, no bruits Ext-no edema, chords, 2+ DP Neuro-grossly nonfocal  ECG -normal sinus rhythm at a rate of 64, no ST changes.  Personally  reviewed  A/P  1 paroxysmal atrial fibrillation-CHADsvasc 6.  She will need lifelong anticoagulation.  Continue apixaban.  Add Toprol 25 mg daily for rate control if atrial fibrillation recurs.  Check TSH.  2 hypertension-patient's blood pressure is controlled.  However I would like for her to be on an AV nodal blocking agent given history of paroxysmal atrial fibrillation.  Discontinue benazepril and instead treat with Toprol 25 mg daily.  Follow blood pressure and adjust regimen as needed.  Kirk Ruths, MD

## 2020-12-04 ENCOUNTER — Ambulatory Visit (INDEPENDENT_AMBULATORY_CARE_PROVIDER_SITE_OTHER): Payer: Medicare Other | Admitting: Cardiology

## 2020-12-04 ENCOUNTER — Encounter: Payer: Self-pay | Admitting: Cardiology

## 2020-12-04 ENCOUNTER — Other Ambulatory Visit: Payer: Self-pay

## 2020-12-04 VITALS — BP 136/77 | HR 64 | Ht 62.0 in | Wt 138.6 lb

## 2020-12-04 DIAGNOSIS — I10 Essential (primary) hypertension: Secondary | ICD-10-CM | POA: Diagnosis not present

## 2020-12-04 DIAGNOSIS — I480 Paroxysmal atrial fibrillation: Secondary | ICD-10-CM

## 2020-12-04 LAB — TSH: TSH: 2.97 u[IU]/mL (ref 0.450–4.500)

## 2020-12-04 MED ORDER — METOPROLOL SUCCINATE ER 25 MG PO TB24: 25.0000 mg | ORAL_TABLET | Freq: Every day | ORAL | 3 refills | Status: DC

## 2020-12-04 NOTE — Patient Instructions (Signed)
Medication Instructions:   STOP BENAZEPRIL  START METOPROLOL SUCC ER 25 MG ONCE DAILY AT BEDTIME  *If you need a refill on your cardiac medications before your next appointment, please call your pharmacy*   Lab Work:  Your physician recommends that you HAVE LAB WORK TODAY  If you have labs (blood work) drawn today and your tests are completely normal, you will receive your results only by: Marland Kitchen MyChart Message (if you have MyChart) OR . A paper copy in the mail If you have any lab test that is abnormal or we need to change your treatment, we will call you to review the results.   Follow-Up: At [facility], you and your health needs are our priority.  As part of our continuing mission to provide you with exceptional heart care, we have created designated Provider Care Teams.  These Care Teams include your primary Cardiologist (physician) and Advanced Practice Providers (APPs -  Physician Assistants and Nurse Practitioners) who all work together to provide you with the care you need, when you need it.  We recommend signing up for the patient portal called "MyChart".  Sign up information is provided on this After Visit Summary.  MyChart is used to connect with patients for Virtual Visits (Telemedicine).  Patients are able to view lab/test results, encounter notes, upcoming appointments, etc.  Non-urgent messages can be sent to your provider as well.   To learn more about what you can do with MyChart, go to [email or web address].    Your next appointment:   6 month(s)  The format for your next appointment:   In Person  Provider:   Kirk Ruths, MD

## 2020-12-12 ENCOUNTER — Encounter: Payer: Self-pay | Admitting: Neurology

## 2020-12-12 ENCOUNTER — Ambulatory Visit (INDEPENDENT_AMBULATORY_CARE_PROVIDER_SITE_OTHER): Payer: Medicare Other | Admitting: Neurology

## 2020-12-12 ENCOUNTER — Inpatient Hospital Stay: Payer: Medicare Other | Admitting: Neurology

## 2020-12-12 VITALS — BP 124/77 | HR 80 | Ht 62.0 in | Wt 138.0 lb

## 2020-12-12 DIAGNOSIS — I63433 Cerebral infarction due to embolism of bilateral posterior cerebral arteries: Secondary | ICD-10-CM

## 2020-12-12 DIAGNOSIS — I4892 Unspecified atrial flutter: Secondary | ICD-10-CM | POA: Diagnosis not present

## 2020-12-12 DIAGNOSIS — I63213 Cerebral infarction due to unspecified occlusion or stenosis of bilateral vertebral arteries: Secondary | ICD-10-CM | POA: Diagnosis not present

## 2020-12-12 NOTE — Patient Instructions (Signed)
I had a long d/w patient and her son about her recent embolic stroke and atrial flutter, risk for recurrent stroke/TIAs, personally independently reviewed imaging studies and stroke evaluation results and answered questions.Continue Eliquis (apixaban) 5 mg twice daily  for secondary stroke prevention and maintain strict control of hypertension with blood pressure goal below 130/90, diabetes with hemoglobin A1c goal below 6.5% and lipids with LDL cholesterol goal below 70 mg/dL. I also advised the patient to eat a healthy diet with plenty of whole grains, cereals, fruits and vegetables, exercise regularly and maintain ideal body weight Followup in the future with my nurse practitioner Janett Billow in 6 months or call earlier if necessary.  Stroke Prevention Some medical conditions and behaviors are associated with a higher chance of having a stroke. You can help prevent a stroke by making nutrition, lifestyle, and other changes, including managing any medical conditions you may have. What nutrition changes can be made?  Eat healthy foods. You can do this by: ? Choosing foods high in fiber, such as fresh fruits and vegetables and whole grains. ? Eating at least 5 or more servings of fruits and vegetables a day. Try to fill half of your plate at each meal with fruits and vegetables. ? Choosing lean protein foods, such as lean cuts of meat, poultry without skin, fish, tofu, beans, and nuts. ? Eating low-fat dairy products. ? Avoiding foods that are high in salt (sodium). This can help lower blood pressure. ? Avoiding foods that have saturated fat, trans fat, and cholesterol. This can help prevent high cholesterol. ? Avoiding processed and premade foods.  Follow your health care provider's specific guidelines for losing weight, controlling high blood pressure (hypertension), lowering high cholesterol, and managing diabetes. These may include: ? Reducing your daily calorie intake. ? Limiting your daily sodium  intake to 1,500 milligrams (mg). ? Using only healthy fats for cooking, such as olive oil, canola oil, or sunflower oil. ? Counting your daily carbohydrate intake.   What lifestyle changes can be made?  Maintain a healthy weight. Talk to your health care provider about your ideal weight.  Get at least 30 minutes of moderate physical activity at least 5 days a week. Moderate activity includes brisk walking, biking, and swimming.  Do not use any products that contain nicotine or tobacco, such as cigarettes and e-cigarettes. If you need help quitting, ask your health care provider. It may also be helpful to avoid exposure to secondhand smoke.  Limit alcohol intake to no more than 1 drink a day for nonpregnant women and 2 drinks a day for men. One drink equals 12 oz of beer, 5 oz of wine, or 1 oz of hard liquor.  Stop any illegal drug use.  Avoid taking birth control pills. Talk to your health care provider about the risks of taking birth control pills if: ? You are over 8 years old. ? You smoke. ? You get migraines. ? You have ever had a blood clot. What other changes can be made?  Manage your cholesterol levels. ? Eating a healthy diet is important for preventing high cholesterol. If cholesterol cannot be managed through diet alone, you may also need to take medicines. ? Take any prescribed medicines to control your cholesterol as told by your health care provider.  Manage your diabetes. ? Eating a healthy diet and exercising regularly are important parts of managing your blood sugar. If your blood sugar cannot be managed through diet and exercise, you may need to take medicines. ?  Take any prescribed medicines to control your diabetes as told by your health care provider.  Control your hypertension. ? To reduce your risk of stroke, try to keep your blood pressure below 130/80. ? Eating a healthy diet and exercising regularly are an important part of controlling your blood pressure.  If your blood pressure cannot be managed through diet and exercise, you may need to take medicines. ? Take any prescribed medicines to control hypertension as told by your health care provider. ? Ask your health care provider if you should monitor your blood pressure at home. ? Have your blood pressure checked every year, even if your blood pressure is normal. Blood pressure increases with age and some medical conditions.  Get evaluated for sleep disorders (sleep apnea). Talk to your health care provider about getting a sleep evaluation if you snore a lot or have excessive sleepiness.  Take over-the-counter and prescription medicines only as told by your health care provider. Aspirin or blood thinners (antiplatelets or anticoagulants) may be recommended to reduce your risk of forming blood clots that can lead to stroke.  Make sure that any other medical conditions you have, such as atrial fibrillation or atherosclerosis, are managed. What are the warning signs of a stroke? The warning signs of a stroke can be easily remembered as BEFAST.  B is for balance. Signs include: ? Dizziness. ? Loss of balance or coordination. ? Sudden trouble walking.  E is for eyes. Signs include: ? A sudden change in vision. ? Trouble seeing.  F is for face. Signs include: ? Sudden weakness or numbness of the face. ? The face or eyelid drooping to one side.  A is for arms. Signs include: ? Sudden weakness or numbness of the arm, usually on one side of the body.  S is for speech. Signs include: ? Trouble speaking (aphasia). ? Trouble understanding.  T is for time. ? These symptoms may represent a serious problem that is an emergency. Do not wait to see if the symptoms will go away. Get medical help right away. Call your local emergency services (911 in the [place] Do not drive yourself to the hospital.  Other signs of stroke may include: ? A sudden, severe headache with no known cause. ? Nausea or  vomiting. ? Seizure. Where to find more information For more information, visit:  American Stroke Association: www.strokeassociation.org  National Stroke Association: www.stroke.org Summary  You can prevent a stroke by eating healthy, exercising, not smoking, limiting alcohol intake, and managing any medical conditions you may have.  Do not use any products that contain nicotine or tobacco, such as cigarettes and e-cigarettes. If you need help quitting, ask your health care provider. It may also be helpful to avoid exposure to secondhand smoke.  Remember BEFAST for warning signs of stroke. Get help right away if you or a loved one has any of these signs. This information is not intended to replace advice given to you by your health care provider. Make sure you discuss any questions you have with your health care provider. Document Revised: 09/18/2017 Document Reviewed: 11/11/2016 Elsevier Patient Education  2021 [employer].

## 2020-12-12 NOTE — Progress Notes (Signed)
[facility] [address] [place]. [place] [address] [phone]       OFFICE CONSULT NOTE  [name] Date of Birth:  1941-06-30 Medical Record Number:  [id number]   Referring MD:  Berle Mull Reason for Referral:  stroke  HPI: [name] is a pleasant 80 year old Caucasian lady seen today for initial office consultation visit for stroke.  History is obtained from the patient and her son Tricia Newman as well as review of electronic medical records and I personally reviewed pertinent imaging films in PACS.  Patient has past medical history significant for hypertension, hematemesis, migraine headaches who presented to [facility] emergency room via EMS on 10/05/2020 for altered mental status.  She was last seen by [place] in a few days ago and was fine.  Neighbors called EMS as they found patient in a driver to be confused and perseverating and having speech difficulties and disoriented.  CT scan of the head was unremarkable and MRI scan of the brain was obtained which showed bithalamic infarcts left greater than right as well as involving the right hippocampus.  CT angiogram showed severe right PCA and moderate left P2 stenosis.  CT angio of the neck showed no significant extracranial stenosis.  2D echo showed ejection fraction 6065%.  LDL cholesterol 99 mg percent and hemoglobin A1c was 4.9.  Patient was found to be new onset of a flutter and started on anticoagulation with Eliquis.  Patient did quite well and the confusion disorientation and speech improved significantly back to baseline quickly.  She was also started on beta-blocker.  Patient is back to her baseline to living at home she is independent she has no residual deficits from the stroke.  She is tolerating Eliquis well without bleeding with only minor bruising.  She is following up with a cardiologist.  Her blood pressure is well controlled and today it is 124/77.  She is tolerating Lipitor well without muscle aches and  pains.  ROS:   14 system review of systems is positive for  Confusion, disorientation, speech difficulty and all other systems negative. PMH:  Past Medical History:  Diagnosis Date  . Allergic rhinitis   . CVA (cerebral vascular accident) ([place])   . Hypertension   . Migraine headache   . Osteoporosis   . PMS (premenstrual syndrome)   . Rosacea     Social History:  Social History   Socioeconomic History  . Marital status: Widowed    Spouse name: Not on file  . Number of children: 2  . Years of education: Not on file  . Highest education level: Not on file  Occupational History  . Not on file  Tobacco Use  . Smoking status: Former Smoker    Quit date: 10/20/1976    Years since quitting: 44.1  . Smokeless tobacco: Never Used  Substance and Sexual Activity  . Alcohol use: No  . Drug use: No  . Sexual activity: Not on file  Other Topics Concern  . Not on file  Social History Narrative   Lives alone   Right handed   Drinks 2-4 cups caffeine daily   Social Determinants of Health   Financial Resource Strain: Not on file  Food Insecurity: Not on file  Transportation Needs: Not on file  Physical Activity: Not on file  Stress: Not on file  Social Connections: Not on file  Intimate Partner Violence: Not on file    Medications:   Current Outpatient Medications on File Prior to Visit  Medication Sig Dispense Refill  . apixaban (ELIQUIS) 5 MG TABS tablet Take 1 tablet (5 mg total) by mouth 2 (two) times daily. 180 tablet 0  . atorvastatin (LIPITOR) 40 MG tablet Take 1 tablet (40 mg total) by mouth daily. 90 tablet 3  . Calcium Carbonate-Vitamin D (CALCIUM 600 + D PO) Take 1 tablet by mouth daily.    . fish oil-omega-3 fatty acids 1000 MG capsule Take 2 g by mouth 2 (two) times daily.    Marland Kitchen loratadine (CLARITIN) 10 MG tablet Take 10 mg by mouth daily.    . metoprolol succinate (TOPROL XL) 25 MG 24 hr tablet Take 1 tablet (25 mg total) by mouth daily. 90 tablet 3  . Multiple  Vitamins-Minerals (CENTRUM SILVER PO) Take 1 tablet by mouth daily.    Marland Kitchen tiZANidine (ZANAFLEX) 4 MG tablet Take 0.5-1 tablets (2-4 mg total) by mouth every 12 (twelve) hours as needed for muscle spasms. Please establish with a new provider for further refills 60 tablet 1  . traMADol (ULTRAM) 50 MG tablet TAKE 1 TABLET BY MOUTH AT BEDTIME AS NEEDED FOR PAIN 30 tablet 1   No current facility-administered medications on file prior to visit.    Allergies:  No Known Allergies  Physical Exam General:pleasant elderly caucasian , seated, in no evident distress Head: head normocephalic and atraumatic.   Neck: supple with no carotid or supraclavicular bruits Cardiovascular: regular rate and rhythm, no murmurs Musculoskeletal: no deformity Skin:  no rash/petichiae Vascular:  Normal pulses all extremities  Neurologic Exam Mental Status: Awake and fully alert. Oriented to place and time. Recent and remote memory intact. Attention span, concentration and fund of knowledge appropriate. Mood and affect appropriate.  Cranial Nerves: Fundoscopic exam reveals sharp disc margins. Pupils equal, briskly reactive to light. Extraocular movements full without nystagmus. Visual fields full to confrontation. Hearing intact. Facial sensation intact. Face, tongue, palate moves normally and symmetrically.  Motor: Normal bulk and tone. Normal strength in all tested extremity muscles. Sensory.: intact to touch , pinprick , position and vibratory sensation.  Coordination: Rapid alternating movements normal in all extremities. Finger-to-nose and heel-to-shin performed accurately bilaterally. Gait and Station: Arises from chair without difficulty. Stance is normal. Gait demonstrates normal stride length and balance . Able to heel, toe and tandem walk with only slight difficulty.  Reflexes: 1+ and symmetric. Toes downgoing.   NIHSS 0 Modified Rankin 0   ASSESSMENT: 80 year old pleasant Caucasian lady with bilateral  posterior cerebral artery embolic strokes and December 2021 from new onset atrial flutter.  She is doing remarkably well with no residual deficits.  Vascular risk factors of atrial flutter, hyperlipidemia and age     PLAN: I had a long d/w patient and her son about her recent embolic stroke and atrial flutter, risk for recurrent stroke/TIAs, personally independently reviewed imaging studies and stroke evaluation results and answered questions.Continue Eliquis (apixaban) 5 mg twice daily  for secondary stroke prevention and maintain strict control of hypertension with blood pressure goal below 130/90, diabetes with hemoglobin A1c goal below 6.5% and lipids with LDL cholesterol goal below 70 mg/dL. I also advised the patient to eat a healthy diet with plenty of whole grains, cereals, fruits and vegetables, exercise regularly and maintain ideal body weight Followup in the future with my nurse practitioner Janett Billow in 6 months or call earlier if necessary.  Greater than 50% time during this 45-minute consultation visit with spent in counseling and coordination of care about her embolic strokes and atrial flutter  and answering questions. Antony Contras, MD  [facility] [address] [place], [place] [address]  Phone [phone] Fax [phone] Note: This document was prepared with digital dictation and possible smart phrase technology. Any transcriptional errors that result from this process are unintentional.

## 2021-01-21 ENCOUNTER — Inpatient Hospital Stay: Payer: Medicare Other | Admitting: Neurology

## 2021-02-05 DIAGNOSIS — H18413 Arcus senilis, bilateral: Secondary | ICD-10-CM | POA: Diagnosis not present

## 2021-02-05 DIAGNOSIS — H26492 Other secondary cataract, left eye: Secondary | ICD-10-CM | POA: Diagnosis not present

## 2021-02-05 DIAGNOSIS — Z961 Presence of intraocular lens: Secondary | ICD-10-CM | POA: Diagnosis not present

## 2021-02-05 DIAGNOSIS — H04123 Dry eye syndrome of bilateral lacrimal glands: Secondary | ICD-10-CM | POA: Diagnosis not present

## 2021-02-12 ENCOUNTER — Other Ambulatory Visit: Payer: Self-pay | Admitting: Family Medicine

## 2021-03-20 ENCOUNTER — Telehealth: Payer: Self-pay | Admitting: Family Medicine

## 2021-03-20 NOTE — Chronic Care Management (AMB) (Signed)
  Chronic Care Management   Outreach Note  0/10/2222 Name: Tricia Newman MRN: [id number] DOB: September 23, 1941  Referred by: [name], [name], MD Reason for referral : No chief complaint on file.   An unsuccessful telephone outreach was attempted today. The patient was referred to the pharmacist for assistance with care management and care coordination.   Follow Up Plan:   Tatjana Dellinger Upstream Scheduler

## 2021-03-20 NOTE — Chronic Care Management (AMB) (Signed)
  Chronic Care Management   Note  04/19/625 Name: Ziggy Reveles MRN: [id number] DOB: [birth year]  Venetia Prewitt Rolfson is a 80 y.o. year old female who is a primary care patient of [name], Malka So, MD. I reached out to [place] by phone today in response to a referral sent by Ms. Waymon Budge Savich's PCP, [name], [name], MD.   Ms. Brinegar was given information about Chronic Care Management services today including:  1. CCM service includes personalized support from designated clinical staff supervised by her physician, including individualized plan of care and coordination with other care providers 2. 24/7 contact phone numbers for assistance for urgent and routine care needs. 3. Service will only be billed when office clinical staff spend 20 minutes or more in a month to coordinate care. 4. Only one practitioner may furnish and bill the service in a calendar month. 5. The patient may stop CCM services at any time (effective at the end of the month) by phone call to the office staff.   Patient agreed to services and verbal consent obtained.   Follow up plan:   Tatjana Secretary/administrator

## 2021-04-29 ENCOUNTER — Telehealth: Payer: Self-pay | Admitting: Pharmacist

## 2021-04-29 NOTE — Chronic Care Management (AMB) (Signed)
    Chronic Care Management Pharmacy Assistant   Name: Tricia Newman  MRN: [id number] DOB: December 02, 1940  Reason for Encounter: Chart Prep for CCM visit on 07.15.2022   Conditions to be addressed/monitored: HTN, HLD, and CVA  Primary concerns for visit include:   Recent office visits:  01.14.2022 [name], [name], MD follow-up for Thrombocytopenia, Atrial flutter, Hyperlipidemia, and essential hypertension  Recent consult visits:  02.23.2022 [name], MD Neurology initial office consultation visit for stroke 02.15.2022 [name], MD Cardiology initial office consultation visit for atrial fibrillation. Medication prescribed Metoprolol Succinate 25 mg Oral Daily. Medication discontinued Benazepril HCl 10 mg.  Hospital visits:  None in previous 6 months  Medications: Outpatient Encounter Medications as of 04/29/2021  Medication Sig   atorvastatin (LIPITOR) 40 MG tablet Take 1 tablet (40 mg total) by mouth daily.   Calcium Carbonate-Vitamin D (CALCIUM 600 + D PO) Take 1 tablet by mouth daily.   ELIQUIS 5 MG TABS tablet TAKE 1 TABLET BY MOUTH TWICE A DAY   fish oil-omega-3 fatty acids 1000 MG capsule Take 2 g by mouth 2 (two) times daily.   loratadine (CLARITIN) 10 MG tablet Take 10 mg by mouth daily.   metoprolol succinate (TOPROL XL) 25 MG 24 hr tablet Take 1 tablet (25 mg total) by mouth daily.   Multiple Vitamins-Minerals (CENTRUM SILVER PO) Take 1 tablet by mouth daily.   tiZANidine (ZANAFLEX) 4 MG tablet Take 0.5-1 tablets (2-4 mg total) by mouth every 12 (twelve) hours as needed for muscle spasms. Please establish with a new provider for further refills   traMADol (ULTRAM) 50 MG tablet TAKE 1 TABLET BY MOUTH AT BEDTIME AS NEEDED FOR PAIN   No facility-administered encounter medications on file as of 04/29/2021.    Care Gaps: Zost Vaccines Covid-19- 4th booster  Star Rating Drugs: Medication Dispensed Quantity Pharmacy  Atorvastatin 40 mg  07.07.2022 [facility]     [name], [place] Pharmacist Assistant [phone]

## 2021-05-03 ENCOUNTER — Encounter: Payer: Self-pay | Admitting: Family Medicine

## 2021-05-03 ENCOUNTER — Ambulatory Visit (INDEPENDENT_AMBULATORY_CARE_PROVIDER_SITE_OTHER): Payer: Medicare Other | Admitting: Pharmacist

## 2021-05-03 ENCOUNTER — Ambulatory Visit (INDEPENDENT_AMBULATORY_CARE_PROVIDER_SITE_OTHER): Payer: Medicare Other | Admitting: Family Medicine

## 2021-05-03 ENCOUNTER — Other Ambulatory Visit: Payer: Self-pay

## 2021-05-03 VITALS — BP 120/70 | HR 65 | Resp 16 | Ht 62.0 in | Wt 135.0 lb

## 2021-05-03 DIAGNOSIS — I10 Essential (primary) hypertension: Secondary | ICD-10-CM | POA: Diagnosis not present

## 2021-05-03 DIAGNOSIS — I4892 Unspecified atrial flutter: Secondary | ICD-10-CM

## 2021-05-03 DIAGNOSIS — E785 Hyperlipidemia, unspecified: Secondary | ICD-10-CM

## 2021-05-03 DIAGNOSIS — I63213 Cerebral infarction due to unspecified occlusion or stenosis of bilateral vertebral arteries: Secondary | ICD-10-CM

## 2021-05-03 LAB — COMPREHENSIVE METABOLIC PANEL
ALT: 43 U/L — ABNORMAL HIGH (ref 0–35)
AST: 43 U/L — ABNORMAL HIGH (ref 0–37)
Albumin: 4.5 g/dL (ref 3.5–5.2)
Alkaline Phosphatase: 131 U/L — ABNORMAL HIGH (ref 39–117)
BUN: 22 mg/dL (ref 6–23)
CO2: 28 mEq/L (ref 19–32)
Calcium: 9.5 mg/dL (ref 8.4–10.5)
Chloride: 103 mEq/L (ref 96–112)
Creatinine, Ser: 0.68 mg/dL (ref 0.40–1.20)
GFR: 81.73 mL/min (ref >60.00)
Glucose, Bld: 77 mg/dL (ref 70–99)
Potassium: 3.9 mEq/L (ref 3.5–5.1)
Sodium: 139 mEq/L (ref 135–145)
Total Bilirubin: 0.8 mg/dL (ref 0.2–1.2)
Total Protein: 7.7 g/dL (ref 6.0–8.3)

## 2021-05-03 LAB — LIPID PANEL
Cholesterol: 120 mg/dL (ref 0–200)
HDL: 70.4 mg/dL (ref >39.00)
LDL Cholesterol: 42 mg/dL (ref 0–99)
NonHDL: 49.21
Total CHOL/HDL Ratio: 2
Triglycerides: 37 mg/dL (ref 0.0–149.0)
VLDL: 7.4 mg/dL (ref 0.0–40.0)

## 2021-05-03 NOTE — Progress Notes (Signed)
HPI: Tricia Newman is a 80 y.o. female, who is here today for 6 months follow up.   She was last seen on 11/02/20. Since her last visit she has seen her neurologist and cardiologist.  Lab Results  Component Value Date   CREATININE 0.63 11/02/2020   BUN 17 11/02/2020   NA 140 11/02/2020   K 4.2 11/02/2020   CL 105 11/02/2020   CO2 27 11/02/2020   CVA in 09/2020, no significant residual deficit. She still has trouble finding words, it is getting better. Residual "small goblet" in visual field, stable. She has seen her ophthalmologist.  Independent ADL's. She lives alone, her grandchildren visit frequently.  HLD: She is on Atorvastatin 40 mg daily. Tolerating medications well. She has not had red meat in 6 months. Following a healthful diet, she has lost some wt. She is active with chores around her house and has a garden.  Lab Results  Component Value Date   CHOL 183 10/05/2020   HDL 71 10/05/2020   LDLCALC 99 10/05/2020   LDLDIRECT 105.7 07/30/2011   TRIG 67 10/05/2020   CHOLHDL 2.6 10/05/2020   HTN: She is on  Metoprolol succinate 25 mg daily. Atrial fib/flutter on Eliquis 5 mg bid. Negative for severe/frequent headache,chest pain, dyspnea, palpitation, focal weakness, or edema.  Component     Latest Ref Rng & Units 11/02/2020  Glucose     70 - 99 mg/dL 82  BUN     6 - 23 mg/dL 17  Creatinine     0.40 - 1.20 mg/dL 0.63  BUN/Creatinine Ratio     6 - 22 (calc) NOT APPLICABLE  Sodium     580 - 145 mEq/L 140  Potassium     3.5 - 5.1 mEq/L 4.2  Chloride     96 - 112 mEq/L 105  CO2     19 - 32 mEq/L 27  Calcium     8.4 - 10.5 mg/dL 9.4   Review of Systems  Constitutional:  Negative for activity change, appetite change and fever.  HENT:  Negative for mouth sores and nosebleeds.   Respiratory:  Negative for cough and wheezing.   Gastrointestinal:  Negative for abdominal pain, nausea and vomiting.       Negative for changes in bowel habits.   Genitourinary:  Negative for decreased urine volume and hematuria.  Skin:  Negative for pallor and rash.  Neurological:  Negative for syncope, facial asymmetry and weakness.  Psychiatric/Behavioral:  Negative for confusion. The patient is not nervous/anxious.   Rest of ROS, see pertinent positives sand negatives in HPI  Current Outpatient Medications on File Prior to Visit  Medication Sig Dispense Refill   atorvastatin (LIPITOR) 40 MG tablet Take 1 tablet (40 mg total) by mouth daily. 90 tablet 3   Calcium Carbonate-Vitamin D (CALCIUM 600 + D PO) Take 1 tablet by mouth daily.     ELIQUIS 5 MG TABS tablet TAKE 1 TABLET BY MOUTH TWICE A DAY 180 tablet 3   loratadine (CLARITIN) 10 MG tablet Take 10 mg by mouth daily.     metoprolol succinate (TOPROL XL) 25 MG 24 hr tablet Take 1 tablet (25 mg total) by mouth daily. 90 tablet 3   Multiple Vitamins-Minerals (CENTRUM SILVER PO) Take 1 tablet by mouth daily.     tiZANidine (ZANAFLEX) 4 MG tablet Take 0.5-1 tablets (2-4 mg total) by mouth every 12 (twelve) hours as needed for muscle spasms. Please establish with a new provider  for further refills 60 tablet 1   traMADol (ULTRAM) 50 MG tablet TAKE 1 TABLET BY MOUTH AT BEDTIME AS NEEDED FOR PAIN 30 tablet 1   No current facility-administered medications on file prior to visit.   Past Medical History:  Diagnosis Date   Allergic rhinitis    CVA (cerebral vascular accident) ([place])    Hypertension    Migraine headache    Osteoporosis    PMS (premenstrual syndrome)    Rosacea    No Known Allergies  Social History   Socioeconomic History   Marital status: Widowed    Spouse name: Not on file   Number of children: 2   Years of education: Not on file   Highest education level: Not on file  Occupational History   Not on file  Tobacco Use   Smoking status: Former    Types: Cigarettes    Quit date: 10/20/1976    Years since quitting: 44.5   Smokeless tobacco: Never  Substance and Sexual  Activity   Alcohol use: No   Drug use: No   Sexual activity: Not on file  Other Topics Concern   Not on file  Social History Narrative   Lives alone   Right handed   Drinks 2-4 cups caffeine daily   Social Determinants of Health   Financial Resource Strain: Not on file  Food Insecurity: Not on file  Transportation Needs: Not on file  Physical Activity: Not on file  Stress: Not on file  Social Connections: Not on file   Vitals:   05/03/21 0944  BP: 120/70  Pulse: 65  Resp: 16  SpO2: 98%   Body mass index is 24.69 kg/m.  Physical Exam Vitals and nursing note reviewed.  Constitutional:      General: She is not in acute distress.    Appearance: She is well-developed.  HENT:     Head: Normocephalic and atraumatic.     Mouth/Throat:     Mouth: Mucous membranes are moist.     Pharynx: Oropharynx is clear.  Eyes:     Conjunctiva/sclera: Conjunctivae normal.  Cardiovascular:     Rate and Rhythm: Normal rate. Rhythm irregular.     Pulses:          Dorsalis pedis pulses are 2+ on the right side and 2+ on the left side.     Heart sounds: No murmur heard. Pulmonary:     Effort: Pulmonary effort is normal. No respiratory distress.     Breath sounds: Normal breath sounds.  Abdominal:     Palpations: Abdomen is soft. There is no hepatomegaly or mass.     Tenderness: There is no abdominal tenderness.  Lymphadenopathy:     Cervical: No cervical adenopathy.  Skin:    General: Skin is warm.     Findings: No erythema or rash.  Neurological:     General: No focal deficit present.     Mental Status: She is alert and oriented to person, place, and time.     Cranial Nerves: No cranial nerve deficit.     Comments: Otherwise stable gait, not assisted.  Psychiatric:     Comments: Well groomed, good eye contact.   ASSESSMENT AND PLAN:  [name] was seen today for 6 months follow-up.  Diagnoses and all orders for this visit: Orders Placed This Encounter   Procedures   Comprehensive metabolic panel   Lipid panel   Lab Results  Component Value Date   CHOL 120 05/03/2021  HDL 70.40 05/03/2021   LDLCALC 42 05/03/2021   LDLDIRECT 105.7 07/30/2011   TRIG 37.0 05/03/2021   CHOLHDL 2 05/03/2021   Lab Results  Component Value Date   CREATININE 0.68 05/03/2021   BUN 22 05/03/2021   NA 139 05/03/2021   K 3.9 05/03/2021   CL 103 05/03/2021   CO2 28 05/03/2021   Lab Results  Component Value Date   ALT 43 (H) 05/03/2021   AST 43 (H) 05/03/2021   ALKPHOS 131 (H) 05/03/2021   BILITOT 0.8 05/03/2021   Essential hypertension BP adequately controlled. Continue Metoprolol succinate 25 mg daily and low salt diet.  Hyperlipidemia, unspecified hyperlipidemia type Continue Atorvastatin 40 mg daily and low fat diet. Ideal LDL < 70.  Atrial flutter, unspecified type ([place]) Today rate controlled. Not rhythm controlled. Continue Eliquis and Metoprolol succinate same dose. She has an appt with cardiologist.  Return in about 6 months (around 11/03/2021) for AWV after 08/01/21.Marland Kitchen   Pearly Apachito G. [name], MD  [facility]. [place] office.

## 2021-05-03 NOTE — Progress Notes (Signed)
Chronic Care Management Pharmacy Note  1/60/1093 Name:  Tricia Newman MRN:  [id number] DOB:  1941-02-15  Summary: BP is at goal < 130/80 per office readings LDL not at goal < 70  Recommendations/Changes made from today's visit: -Recommend repeat lipid panel and consider escalation of statin therapy -Recommended weekly monitoring of BP at home -Recommended supplementing with calcium citrate 300 mg daily to get recommended calcium intake  Plan: Follow up BP assessment in 1 month   Subjective: Tricia Newman is an 80 y.o. year old female who is a primary patient of [name], Malka So, MD.  The CCM team was consulted for assistance with disease management and care coordination needs.    Engaged with patient face to face for initial visit in response to provider referral for pharmacy case management and/or care coordination services.   Consent to Services:  The patient was given the following information about Chronic Care Management services today, agreed to services, and gave verbal consent: 1. CCM service includes personalized support from designated clinical staff supervised by the primary care provider, including individualized plan of care and coordination with other care providers 2. 24/7 contact phone numbers for assistance for urgent and routine care needs. 3. Service will only be billed when office clinical staff spend 20 minutes or more in a month to coordinate care. 4. Only one practitioner may furnish and bill the service in a calendar month. 5.The patient may stop CCM services at any time (effective at the end of the month) by phone call to the office staff. 6. The patient will be responsible for cost sharing (co-pay) of up to 20% of the service fee (after annual deductible is met). Patient agreed to services and consent obtained.  Patient Care Team: [name], [name], MD as PCP - General (Family Medicine) Viona Gilmore, [facility] as Pharmacist (Pharmacist)  Recent office  visits: 11/02/20 [name], MD: Patient presented for chronic conditions follow up. No medication changes. Plan to repeat FLP at next visit.  Recent consult visits: 12/12/20 Antony Contras, MD (neurology): Patient presented for initial visit for CVA. No medication changes.  12/04/20 Kirk Ruths, MD (cardiology): Patient presented for initial visit for Afib/Aflutter. D/c'd benazepril and started on metoprolol succinate 25 mg.   Hospital visits: None in previous 6 months   Objective:  Lab Results  Component Value Date   CREATININE 0.68 05/03/2021   BUN 22 05/03/2021   GFR 81.73 05/03/2021   GFRNONAA >60 10/05/2020   GFRAA 101 08/01/2020   NA 139 05/03/2021   K 3.9 05/03/2021   CALCIUM 9.5 05/03/2021   CO2 28 05/03/2021   GLUCOSE 77 05/03/2021    Lab Results  Component Value Date/Time   HGBA1C 4.9 10/05/2020 11:17 AM   GFR 81.73 05/03/2021 11:49 AM   GFR 92.72 08/01/2019 07:47 AM    Last diabetic Eye exam: No results found for: HMDIABEYEEXA  Last diabetic Foot exam: No results found for: HMDIABFOOTEX   Lab Results  Component Value Date   CHOL 120 05/03/2021   HDL 70.40 05/03/2021   LDLCALC 42 05/03/2021   LDLDIRECT 105.7 07/30/2011   TRIG 37.0 05/03/2021   CHOLHDL 2 05/03/2021    Hepatic Function Latest Ref Rng & Units 05/03/2021 10/05/2020 08/01/2020  Total Protein 6.0 - 8.3 g/dL 7.7 6.3(L) 7.1  Albumin 3.5 - 5.2 g/dL 4.5 3.7 -  AST 0 - 37 U/L 43(H) 33 24  ALT 0 - 35 U/L 43(H) 20 16  Alk Phosphatase 39 -  117 U/L 131(H) 67 -  Total Bilirubin 0.2 - 1.2 mg/dL 0.8 0.9 0.7  Bilirubin, Direct 0.0 - 0.3 mg/dL - - -    Lab Results  Component Value Date/Time   TSH 2.970 12/04/2020 09:32 AM   TSH 4.47 10/06/2017 09:11 AM    CBC Latest Ref Rng & Units 11/02/2020 10/07/2020 10/05/2020  WBC 3.8 - 10.8 Thousand/uL 4.3 6.9 4.2  Hemoglobin 11.7 - 15.5 g/dL 13.2 13.2 12.4  Hematocrit 35.0 - 45.0 % 40.2 39.9 38.1  Platelets 140 - 400 Thousand/uL 162 143(L) 100(L)     No results found for: VD25OH  Clinical ASCVD: Yes  The ASCVD Risk score Mikey Bussing DC Jr., et al., 2013) failed to calculate for the following reasons:   The 2013 ASCVD risk score is only valid for ages 81 to 82   The patient has a prior MI or stroke diagnosis    Depression screen [facility] 2/9 08/01/2020 08/01/2019 10/06/2017  Decreased Interest 0 0 0  Down, Depressed, Hopeless 0 0 0  PHQ - 2 Score 0 0 0     Social History   Tobacco Use  Smoking Status Former   Types: Cigarettes   Quit date: 10/20/1976   Years since quitting: 44.6  Smokeless Tobacco Never   BP Readings from Last 3 Encounters:  05/03/21 120/70  12/12/20 124/77  12/04/20 136/77   Pulse Readings from Last 3 Encounters:  05/03/21 65  12/12/20 80  12/04/20 64   Wt Readings from Last 3 Encounters:  05/03/21 135 lb (61.2 kg)  12/12/20 138 lb (62.6 kg)  12/04/20 138 lb 9.6 oz (62.9 kg)   BMI Readings from Last 3 Encounters:  05/03/21 24.69 kg/m  12/12/20 25.24 kg/m  12/04/20 25.35 kg/m    Assessment/Interventions: Review of patient past medical history, allergies, medications, health status, including review of consultants reports, laboratory and other test data, was performed as part of comprehensive evaluation and provision of chronic care management services.   SDOH:  (Social Determinants of Health) assessments and interventions performed: Yes SDOH Interventions    Flowsheet Row Most Recent Value  SDOH Interventions   Financial Strain Interventions Intervention Not Indicated  Transportation Interventions Intervention Not Indicated      SDOH Screenings   Alcohol Screen: Not on file  Depression (PHQ2-9): Low Risk    PHQ-2 Score: 0  Financial Resource Strain: Low Risk    Difficulty of Paying Living Expenses: Not hard at all  Food Insecurity: Not on file  Housing: Not on file  Physical Activity: Not on file  Social Connections: Not on file  Stress: Not on file  Tobacco Use: Medium Risk    Smoking Tobacco Use: Former   Smokeless Tobacco Use: Never  Transportation Needs: No Transportation Needs   Lack of Transportation (Medical): No   Lack of Transportation (Non-Medical): No   Patient always wakes up at 4am and then will drink 1/2 cup of coffee. She tries to get outside by first light every day. She also enjoys gardening in the morning and does this until 9 am and then relaxes and then eats breakfast around 10-11am. She enjoys shopping as well and reading.  Patient is not eating out much and does not eat any meat. She eats a lot of veggie dogs and veggie burgers. She doesn't feel hungry throughout the day.  She enjoys spending her time at the [facility] in [place], which is a Psychologist, occupational run service that helps seniors file their taxes. They also provide free computer  lessons and it runs from January until the tax season. Throughout the rest of the year they have games, dinners, and lunches that she participates in.  Patient has had a lot of stressful events happen in her life lately. Her youngest sister died in Oct 04, 2023, her son was diagnosed with cancer, her older sister died in 03-04-23 and she had a stroke. Her memory has been off since having the stroke but it has improved since then.  Patient had her grandchildren set alarms for her to take her medications. Three of her grandchildren moved in with her after her stroke (range in age from 27-22) as they were on holiday and helped her figure out a routine for her medications. She is using a weekly pillbox and they still check behind her to make sure everything is in there.   CCM Care Plan  No Known Allergies  Medications Reviewed Today     Reviewed by Viona Gilmore, [facility] (Pharmacist) on 05/03/21 at 1134  Med List Status: <None>   Medication Order Taking? Sig Documenting Provider Last Dose Status Informant  atorvastatin (LIPITOR) 40 MG tablet [id number] Yes Take 1 tablet (40 mg total) by mouth daily. [name], [name], MD  Taking Active   Calcium Carbonate-Vitamin D (CALCIUM 600 + D PO) [id number] Yes Take 1 tablet by mouth daily. [provider] Taking Active Self  ELIQUIS 5 MG TABS tablet [id number] Yes TAKE 1 TABLET BY MOUTH TWICE A DAY [name], [name], MD Taking Active   loratadine (CLARITIN) 10 MG tablet [id number] Yes Take 10 mg by mouth daily. [provider] Taking Active Self  metoprolol succinate (TOPROL XL) 25 MG 24 hr tablet [id number] Yes Take 1 tablet (25 mg total) by mouth daily. [name], MD Taking Active   Multiple Vitamins-Minerals (CENTRUM SILVER PO) [id number] Yes Take 1 tablet by mouth daily. [provider] Taking Active Self  tiZANidine (ZANAFLEX) 4 MG tablet [id number] Yes Take 0.5-1 tablets (2-4 mg total) by mouth every 12 (twelve) hours as needed for muscle spasms. Please establish with a new provider for further refills [name], [name], MD Taking Active Self  traMADol (ULTRAM) 50 MG tablet [id number] Yes TAKE 1 TABLET BY MOUTH AT BEDTIME AS NEEDED FOR PAIN [name], [name], MD Taking Active Self            Patient Active Problem List   Diagnosis Date Noted   Thrombocytopenia ([place]) 11/02/2020   Atrial flutter ([place]) 10/09/2020   CVA (cerebral vascular accident) ([place]) 10/05/2020   Chronic bilateral low back pain with bilateral sciatica 08/01/2019   Chronic pain disorder 10/06/2018   Hyperlipidemia 10/06/2018   Back pain, chronic 08/02/2012   SEBACEOUS GLAND DISORDER 07/08/2010   Essential hypertension 07/21/2008   ACNE ROSACEA 06/24/2007   Allergic rhinitis 05/26/2007   Osteopenia of hip 05/26/2007    Immunization History  Administered Date(s) Administered   Fluad Quad(high Dose 65+) 08/01/2019, 08/01/2020   Influenza Split 07/30/2011, 08/02/2012   Influenza Whole 10/20/2004, 07/21/2008, 07/31/2009, 06/27/2010   Influenza, High Dose Seasonal PF 08/04/2013, 07/24/2015, 07/22/2016, 08/03/2017, 08/02/2018   Influenza,inj,Quad PF,6+ Mos 08/09/2014    PFIZER(Purple Top)SARS-COV-2 Vaccination 01/12/2020, 02/06/2020, 08/18/2020   Pneumococcal Conjugate-13 08/09/2014   Pneumococcal Polysaccharide-23 10/20/2004, 08/04/2013   Td 12/19/1995, 06/18/2006   Tdap 09/22/2016   Zoster, Live 12/07/2007    Conditions to be addressed/monitored:  Hypertension, Hyperlipidemia, Atrial Fibrillation, Osteopenia, and Allergic Rhinitis  Care Plan : [place]  Updates made by Viona Gilmore, [facility]  since 05/17/2021 12:00 AM     Problem: Problem: Hypertension, Hyperlipidemia, Atrial Fibrillation, Osteopenia, and Allergic Rhinitis      Long-Range Goal: Patient-Specific Goal   Start Date: 05/03/2021  Expected End Date: 05/03/2022  This Visit's Progress: On track  Priority: High  Note:   Current Barriers:  Unable to independently monitor therapeutic efficacy  Pharmacist Clinical Goal(s):  Patient will achieve adherence to monitoring guidelines and medication adherence to achieve therapeutic efficacy through collaboration with PharmD and provider.   Interventions: 1:1 collaboration with [name], [name], MD regarding development and update of comprehensive plan of care as evidenced by provider attestation and co-signature Inter-disciplinary care team collaboration (see longitudinal plan of care) Comprehensive medication review performed; medication list updated in electronic medical record  Hypertension  (Status:Goal on track: YES.)   Med Management Intervention:  No changes  (BP goal <130/80) -Controlled -Current treatment: Metoprolol succinate 25 mg 1 tablet daily -Medications previously tried: benazepril (switched to metoprolol post stroke) -Current home readings: not checking consistently -Current dietary habits: doesn't use much salt -Current exercise habits: not consistently -Denies hypotensive/hypertensive symptoms -Educated on BP goals and benefits of medications for prevention of heart attack, stroke and kidney  damage; Importance of home blood pressure monitoring; Proper BP monitoring technique; -Counseled to monitor BP at home weekly, document, and provide log at future appointments -Counseled on diet and exercise extensively Recommended to continue current medication  Hyperlipidemia: (LDL goal < 70) -Uncontrolled -Current treatment: Atorvastatin 40 mg 1 tablet daily Fish oil 1000 mg 1 capsule daily -Medications previously tried: none  -Current dietary patterns: eating lots of fruits and vegetables; limiting fried foods -Current exercise habits: not consistently -Educated on Cholesterol goals;  Benefits of statin for ASCVD risk reduction; Importance of limiting foods high in cholesterol; Exercise goal of 150 minutes per week; -Counseled on diet and exercise extensively Recommended to continue current medication Educated on risk vs benefit with fish oil and recommended discontinuing. Needs repeat lipid panel and will complete today.  Atrial Fibrillation (Goal: prevent stroke and major bleeding) -Controlled -CHADSVASC: 6 -Current treatment: Rate control: Metoprolol succinate 25 mg 1 tablet daily Anticoagulation: Eliquis 5 mg 1 tablet twice daily -Medications previously tried: none -Home BP and HR readings: does not check at home  -Counseled on increased risk of stroke due to Afib and benefits of anticoagulation for stroke prevention; importance of adherence to anticoagulant exactly as prescribed; avoidance of NSAIDs due to increased bleeding risk with anticoagulants; -Counseled on diet and exercise extensively Recommended to continue current medication Assessed patient finances. Patient does not qualify for Eliquis patient assistance.  Osteopenia (Goal prevent fractures) -Controlled -Last DEXA Scan: 04/24/20   T-Score femoral neck: -1.8  T-Score total hip: n/a  T-Score lumbar spine: n/a  T-Score forearm radius: -1.6  10-year probability of major osteoporotic fracture:  12.4%  10-year probability of hip fracture: 2.7% -Patient is not a candidate for pharmacologic treatment -Current treatment  Calcium carbonate - unsure of exact dose Multivitamin 1 tablet daily (contains calcium 300 mg and 1000 units of vitamin D) -Medications previously tried: none  -Recommend [phone] units of vitamin D daily. Recommend 1200 mg of calcium daily from dietary and supplemental sources. Recommend weight-bearing and muscle strengthening exercises for building and maintaining bone density. -Counseled on diet and exercise extensively Recommended calcium citrate 300 mg daily   Pain/muscle spasms  (Goal: minimize pain) -Controlled -Current treatment  Tramadol 50 mg 1 tablet as needed for pain (taking twice a month) Tizanidine 4 mg 1/2-1 tablet  every 12 hours as needed (once every 6 or 7 weeks) -Medications previously tried: none  -Counseled on risk for sedation with taking both of these together.  Allergic rhinitis (Goal: minimize symptoms) -Controlled -Current treatment  Claritin 10 mg 1 tablet daily -Medications previously tried: none  -Recommended to continue current medication Counseled on avoiding allergens.   Health Maintenance -Vaccine gaps: shingrix, COVID booster -Current therapy:  Multivitamin 1 tablet daily -Educated on Cost vs benefit of each product must be carefully weighed by individual consumer -Patient is satisfied with current therapy and denies issues -Recommended to continue current medication  Patient Goals/Self-Care Activities Patient will:  - take medications as prescribed focus on medication adherence by continued use of alarms and pillbox check blood pressure weekly, document, and provide at future appointments target a minimum of 150 minutes of moderate intensity exercise weekly  Follow Up Plan: Telephone follow up appointment with care management team member scheduled for: 6 months       Medication Assistance: None required.   Patient affirms current coverage meets needs.  Compliance/Adherence/Medication fill history: Care Gaps: Shingrix (she did them both [facility] fall at [facility]), COVID booster (both boosters - she had this at [facility])  Star-Rating Drugs: Atorvastatin 40 mg - last filled 04/25/21 for [facility] ds at [facility]  Patient's preferred pharmacy is:  [facility] [place] [address] [place] [address] Phone: [phone] Fax: [phone]  [id number] [id number]: [phone]: [phone] Uses pill box? Yes Pt endorses 100% compliance  We discussed: Current pharmacy is preferred with insurance plan and patient is satisfied with pharmacy services Patient decided to: Continue current medication management strategy  Care Plan and Follow Up Patient Decision:  Patient agrees to Care Plan and Follow-up.  Plan: Telephone follow up appointment with care management team member scheduled for:  6 months  MJeni Salles PharmD, BRockvalePharmacist [place]at [phone]

## 2021-05-03 NOTE — Patient Instructions (Addendum)
A few things to remember from today's visit:  Essential hypertension - Plan: Comprehensive metabolic panel  Hyperlipidemia, unspecified hyperlipidemia type - Plan: Lipid panel  Atrial flutter, unspecified type [facility])  If you need refills please call your pharmacy. Do not use My Chart to request refills or for acute issues that need immediate attention. No changes today. Keep appt with your cardiologist.   Please be sure medication list is accurate. If a new problem present, please set up appointment sooner than planned today.

## 2021-05-03 NOTE — Patient Instructions (Addendum)
Hi Lulabelle,  It was great to get to meet you in person! Below is a summary of some of the topics we discussed.   Please reach out to me if you have any questions or need anything before our follow up!  Best, Maddie  Jeni Salles, PharmD, [facility]   Visit Information   Goals Addressed             This Visit's Progress    Track and Manage My Blood Pressure-Hypertension       Timeframe:  Short-Term Goal Priority:  High Start Date:                             Expected End Date:                       Follow Up Date 07/18/21    - check blood pressure weekly - choose a place to take my blood pressure (home, clinic or office, retail store) - write blood pressure results in a log or diary    Why is this important?   You won't feel high blood pressure, but it can still hurt your blood vessels.  High blood pressure can cause heart or kidney problems. It can also cause a stroke.  Making lifestyle changes like losing a little weight or eating less salt will help.  Checking your blood pressure at home and at different times of the day can help to control blood pressure.  If the doctor prescribes medicine remember to take it the way the doctor ordered.  Call the office if you cannot afford the medicine or if there are questions about it.     Notes:        Patient Care Plan: CCM Pharmacy Care Plan     Problem Identified: Problem: Hypertension, Hyperlipidemia, Atrial Fibrillation, Osteopenia, and Allergic Rhinitis      Long-Range Goal: Patient-Specific Goal   Start Date: 05/03/2021  Expected End Date: 05/03/2022  This Visit's Progress: On track  Priority: High  Note:   Current Barriers:  Unable to independently monitor therapeutic efficacy  Pharmacist Clinical Goal(s):  Patient will achieve adherence to monitoring guidelines and medication adherence to achieve therapeutic efficacy through collaboration with  PharmD and provider.   Interventions: 1:1 collaboration with [name], [name], MD regarding development and update of comprehensive plan of care as evidenced by provider attestation and co-signature Inter-disciplinary care team collaboration (see longitudinal plan of care) Comprehensive medication review performed; medication list updated in electronic medical record  Hypertension  (Status:Goal on track: YES.)   Med Management Intervention:  No changes  (BP goal <130/80) -Controlled -Current treatment: Metoprolol succinate 25 mg 1 tablet daily -Medications previously tried: benazepril (switched to metoprolol post stroke) -Current home readings: not checking consistently -Current dietary habits: doesn't use much salt -Current exercise habits: not consistently -Denies hypotensive/hypertensive symptoms -Educated on BP goals and benefits of medications for prevention of heart attack, stroke and kidney damage; Importance of home blood pressure monitoring; Proper BP monitoring technique; -Counseled to monitor BP at home weekly, document, and provide log at future appointments -Counseled on diet and exercise extensively Recommended to continue current medication  Hyperlipidemia: (LDL goal < 70) -Uncontrolled -Current treatment: Atorvastatin 40 mg 1 tablet daily Fish oil 1000 mg 1 capsule daily -Medications previously tried: none  -Current dietary patterns: eating lots of fruits and vegetables; limiting fried foods -Current exercise  habits: not consistently -Educated on Cholesterol goals;  Benefits of statin for ASCVD risk reduction; Importance of limiting foods high in cholesterol; Exercise goal of 150 minutes per week; -Counseled on diet and exercise extensively Recommended to continue current medication Educated on risk vs benefit with fish oil and recommended discontinuing. Needs repeat lipid panel and will complete today.  Atrial Fibrillation (Goal: prevent stroke and major  bleeding) -Controlled -CHADSVASC: 6 -Current treatment: Rate control: Metoprolol succinate 25 mg 1 tablet daily Anticoagulation: Eliquis 5 mg 1 tablet twice daily -Medications previously tried: none -Home BP and HR readings: does not check at home  -Counseled on increased risk of stroke due to Afib and benefits of anticoagulation for stroke prevention; importance of adherence to anticoagulant exactly as prescribed; avoidance of NSAIDs due to increased bleeding risk with anticoagulants; -Counseled on diet and exercise extensively Recommended to continue current medication Assessed patient finances. Patient does not qualify for Eliquis patient assistance.  Osteopenia (Goal prevent fractures) -Controlled -Last DEXA Scan: 04/24/20   T-Score femoral neck: -1.8  T-Score total hip: n/a  T-Score lumbar spine: n/a  T-Score forearm radius: -1.6  10-year probability of major osteoporotic fracture: 12.4%  10-year probability of hip fracture: 2.7% -Patient is not a candidate for pharmacologic treatment -Current treatment  Calcium carbonate - unsure of exact dose Multivitamin 1 tablet daily (contains calcium 300 mg and 1000 units of vitamin D) -Medications previously tried: none  -Recommend [phone] units of vitamin D daily. Recommend 1200 mg of calcium daily from dietary and supplemental sources. Recommend weight-bearing and muscle strengthening exercises for building and maintaining bone density. -Counseled on diet and exercise extensively Recommended calcium citrate 300 mg daily   Pain/muscle spasms  (Goal: minimize pain) -Controlled -Current treatment  Tramadol 50 mg 1 tablet as needed for pain (taking twice a month) Tizanidine 4 mg 1/2-1 tablet every 12 hours as needed (once every 6 or 7 weeks) -Medications previously tried: none  -Counseled on risk for sedation with taking both of these together.  Allergic rhinitis (Goal: minimize symptoms) -Controlled -Current treatment  Claritin  10 mg 1 tablet daily -Medications previously tried: none  -Recommended to continue current medication Counseled on avoiding allergens.   Health Maintenance -Vaccine gaps: shingrix, COVID booster -Current therapy:  Multivitamin 1 tablet daily -Educated on Cost vs benefit of each product must be carefully weighed by individual consumer -Patient is satisfied with current therapy and denies issues -Recommended to continue current medication  Patient Goals/Self-Care Activities Patient will:  - take medications as prescribed focus on medication adherence by continued use of alarms and pillbox check blood pressure weekly, document, and provide at future appointments target a minimum of 150 minutes of moderate intensity exercise weekly  Follow Up Plan: Telephone follow up appointment with care management team member scheduled for: 6 months      Ms. Hartje was given information about Chronic Care Management services today including:  CCM service includes personalized support from designated clinical staff supervised by her physician, including individualized plan of care and coordination with other care providers 24/7 contact phone numbers for assistance for urgent and routine care needs. Standard insurance, coinsurance, copays and deductibles apply for chronic care management only during months in which we provide at least 20 minutes of these services. Most insurances cover these services at 100%, however patients may be responsible for any copay, coinsurance and/or deductible if applicable. This service may help you avoid the need for more expensive face-to-face services. Only one practitioner may furnish and bill  the service in a calendar month. The patient may stop CCM services at any time (effective at the end of the month) by phone call to the office staff.  Patient agreed to services and verbal consent obtained.   The patient verbalized understanding of instructions, educational  materials, and care plan provided today and agreed to receive a mailed copy of patient instructions, educational materials, and care plan.  Telephone follow up appointment with pharmacy team member scheduled for: 6 months  Viona Gilmore, [facility]

## 2021-05-09 ENCOUNTER — Ambulatory Visit: Payer: Medicare Other

## 2021-05-29 ENCOUNTER — Telehealth: Payer: Self-pay | Admitting: Pharmacist

## 2021-05-29 NOTE — Progress Notes (Signed)
HPI: FU atrial fibrillation.  Patient admitted December 2021 with altered mental status.  MRI consistent with posterior circulation infarct.  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  Echocardiogram December 2021 showed normal LV function.  Patient noted to have paroxysmal atrial fibrillation during hospitalization and apixaban added.  Since last seen, patient denies dyspnea, chest pain, palpitations or syncope.  No bleeding.  She does complain of fatigue.  Current Outpatient Medications  Medication Sig Dispense Refill   atorvastatin (LIPITOR) 40 MG tablet Take 1 tablet (40 mg total) by mouth daily. 90 tablet 3   Calcium Carbonate-Vitamin D (CALCIUM 600 + D PO) Take 1 tablet by mouth daily.     ELIQUIS 5 MG TABS tablet TAKE 1 TABLET BY MOUTH TWICE A DAY 180 tablet 3   loratadine (CLARITIN) 10 MG tablet Take 10 mg by mouth daily.     metoprolol succinate (TOPROL XL) 25 MG 24 hr tablet Take 1 tablet (25 mg total) by mouth daily. 90 tablet 3   Multiple Vitamins-Minerals (CENTRUM SILVER PO) Take 1 tablet by mouth daily.     tiZANidine (ZANAFLEX) 4 MG tablet Take 0.5-1 tablets (2-4 mg total) by mouth every 12 (twelve) hours as needed for muscle spasms. Please establish with a new provider for further refills 60 tablet 1   traMADol (ULTRAM) 50 MG tablet TAKE 1 TABLET BY MOUTH AT BEDTIME AS NEEDED FOR PAIN 30 tablet 1   No current facility-administered medications for this visit.     Past Medical History:  Diagnosis Date   Allergic rhinitis    CVA (cerebral vascular accident) ([place])    Hypertension    Migraine headache    Osteoporosis    PMS (premenstrual syndrome)    Rosacea     Past Surgical History:  Procedure Laterality Date   childbirthx2     COLONOSCOPY  06/2003    Social History   Socioeconomic History   Marital status: Widowed    Spouse name: Not on file   Number of children: 2   Years of education: Not on  file   Highest education level: Not on file  Occupational History   Not on file  Tobacco Use   Smoking status: Former    Types: Cigarettes    Quit date: 10/20/1976    Years since quitting: 44.6   Smokeless tobacco: Never  Substance and Sexual Activity   Alcohol use: No   Drug use: No   Sexual activity: Not on file  Other Topics Concern   Not on file  Social History Narrative   Lives alone   Right handed   Drinks 2-4 cups caffeine daily   Social Determinants of Health   Financial Resource Strain: Low Risk    Difficulty of Paying Living Expenses: Not hard at all  Food Insecurity: Not on file  Transportation Needs: No Transportation Needs   Lack of Transportation (Medical): No   Lack of Transportation (Non-Medical): No  Physical Activity: Not on file  Stress: Not on file  Social Connections: Not on file  Intimate Partner Violence: Not on file    Family History  Problem Relation Age of Onset   Colon polyps Sister    Colon polyps Brother    Breast cancer Maternal Aunt    CAD Mother    Colon cancer Neg Hx     ROS: no fevers or chills, productive cough, hemoptysis, dysphasia, odynophagia, melena, hematochezia, dysuria, hematuria,  rash, seizure activity, orthopnea, PND, pedal edema, claudication. Remaining systems are negative.  Physical Exam: Well-developed well-nourished in no acute distress.  Skin is warm and dry.  HEENT is normal.  Neck is supple.  Chest is clear to auscultation with normal expansion.  Cardiovascular exam is irregular Abdominal exam nontender or distended. No masses palpated. Extremities show no edema. neuro grossly intact  ECG-relation at a rate of 86, normal axis, no ST changes.  Personally reviewed  A/P  1 paroxysmal atrial fibrillation-patient has developed recurrent atrial fibrillation.  Not clear to me that she is symptomatic.  She states she has had some fatigue since her CVA in December and she was in sinus in February.  However it  certainly could be contributing.  Beta-blockade could also be contributing.  I will discontinue Toprol and instead treat with Cardizem CD 120 mg daily for rate control to see if this improves her fatigue.  We also discussed the potential for cardioversion.  However she is hesitant and would prefer rate control at this point.  If her symptoms do not improve with changing Toprol to Cardizem we will consider cardioversion if she is agreeable.  We will continue apixaban at present dose.  2 hypertension-patient's blood pressure is controlled.  Medication changes as outlined above.  Kirk Ruths, MD

## 2021-05-29 NOTE — Chronic Care Management (AMB) (Signed)
Chronic Care Management Pharmacy Assistant   Name: Tricia Newman  MRN: [id number] DOB: 1941-07-27  Reason for Encounter: Disease State / Hypertension Assessment Call   Conditions to be addressed/monitored: HTN  Recent office visits:  None  Recent consult visits:  None  Hospital visits:  None in previous 6 months  Medications: Outpatient Encounter Medications as of 05/29/2021  Medication Sig   atorvastatin (LIPITOR) 40 MG tablet Take 1 tablet (40 mg total) by mouth daily.   Calcium Carbonate-Vitamin D (CALCIUM 600 + D PO) Take 1 tablet by mouth daily.   ELIQUIS 5 MG TABS tablet TAKE 1 TABLET BY MOUTH TWICE A DAY   loratadine (CLARITIN) 10 MG tablet Take 10 mg by mouth daily.   metoprolol succinate (TOPROL XL) 25 MG 24 hr tablet Take 1 tablet (25 mg total) by mouth daily.   Multiple Vitamins-Minerals (CENTRUM SILVER PO) Take 1 tablet by mouth daily.   tiZANidine (ZANAFLEX) 4 MG tablet Take 0.5-1 tablets (2-4 mg total) by mouth every 12 (twelve) hours as needed for muscle spasms. Please establish with a new provider for further refills   traMADol (ULTRAM) 50 MG tablet TAKE 1 TABLET BY MOUTH AT BEDTIME AS NEEDED FOR PAIN   No facility-administered encounter medications on file as of 05/29/2021.   Reviewed chart prior to disease state call. Spoke with patient regarding BP  Recent Office Vitals: BP Readings from Last 3 Encounters:  05/03/21 120/70  12/12/20 124/77  12/04/20 136/77   Pulse Readings from Last 3 Encounters:  05/03/21 65  12/12/20 80  12/04/20 64    Wt Readings from Last 3 Encounters:  05/03/21 135 lb (61.2 kg)  12/12/20 138 lb (62.6 kg)  12/04/20 138 lb 9.6 oz (62.9 kg)     Kidney Function Lab Results  Component Value Date/Time   CREATININE 0.68 05/03/2021 11:49 AM   CREATININE 0.63 11/02/2020 03:25 PM   CREATININE 0.58 10/05/2020 11:17 AM   CREATININE 0.57 (L) 08/01/2020 07:54 AM   GFR 81.73 05/03/2021 11:49 AM   GFRNONAA >60 10/05/2020 11:17  AM   GFRNONAA 88 08/01/2020 07:54 AM   GFRAA 101 08/01/2020 07:54 AM    BMP Latest Ref Rng & Units 05/03/2021 11/02/2020 10/05/2020  Glucose 70 - 99 mg/dL 77 82 99  BUN 6 - 23 mg/dL '22 17 15  '$ Creatinine 0.40 - 1.20 mg/dL 0.68 0.63 0.58  BUN/Creat Ratio 6 - 22 (calc) - NOT APPLICABLE -  Sodium A999333 - 145 mEq/L 139 140 138  Potassium 3.5 - 5.1 mEq/L 3.9 4.2 4.2  Chloride 96 - 112 mEq/L 103 105 105  CO2 19 - 32 mEq/L '28 27 22  '$ Calcium 8.4 - 10.5 mg/dL 9.5 9.4 8.8(L)    Current antihypertensive regimen:  Metoprolol succinate 25 mg 1 tablet daily How often are you checking your Blood Pressure? Patient reports she is checking her pressures a few times a week used to take it daily but is always the same Current home BP readings: Patient reports she is ranging around 120/70 sometimes a smidgen lower.  What recent interventions/DTPs have been made by any provider to improve Blood Pressure control since last CPP Visit: Patient reports none.  Any recent hospitalizations or ED visits since last visit with CPP? Patient reports none What diet changes have been made to improve Blood Pressure Control?  Patient reports she eats well. For breakfast she will have an egg every now and again or cereal with milk or oatmeal. She eats from her  garden for lunch she has been having tomatoes and green beans recently and dinner very little meat she stays away from beef altogether will have chicken, salmon or tilapia and fresh garden veggies. She drinks water , and milk. What exercise is being done to improve your Blood Pressure Control?  She reports she is very active in her garden and yard almost  daily. Has been laying down fresh mulch this week, she is not lifting the bag has it delivered by the pallet and rakes it from side to side. She also reports she tends to her garden. Notes: Patient confirmed she is taking the above medication as directed advised she is taking in the evening. She reported she has 9 pills left  of her Eliquis and it was last filled in April, she had been using some samples she was given, she noted her pharmacy usually calls her when its time to refill it but they have not yet so she will call them and check on it, she reports her grandchildren get her medication from pharmacy and set her alarms for when to take it on her phone. Will make CPP aware. She reports she is looking forward to watching her grandson graduate soon, has no other concerns or issues at this time.   Adherence Review: Is the patient currently on ACE/ARB medication? No Does the patient have >5 day gap between last estimated fill dates? No  Care Gaps: CCM F/U call - Scheduled 11-01-21 at 9 am AWV - Scheduled 08-12-2021 Zoster Vaccine - Overdue COVID Booster #4(Pfizer) - Overdue Flu Vaccine - Overdue  Star Rating Drugs: Atorvastatin (Lipitor) 40 mg - Last filled 04-25-2021 [facility] at [place] Pharmacist Assistant [phone]

## 2021-06-10 ENCOUNTER — Other Ambulatory Visit: Payer: Self-pay

## 2021-06-10 ENCOUNTER — Encounter: Payer: Self-pay | Admitting: Cardiology

## 2021-06-10 ENCOUNTER — Ambulatory Visit (INDEPENDENT_AMBULATORY_CARE_PROVIDER_SITE_OTHER): Payer: Medicare Other | Admitting: Cardiology

## 2021-06-10 VITALS — BP 118/68 | HR 82 | Ht 61.0 in | Wt 135.6 lb

## 2021-06-10 DIAGNOSIS — I480 Paroxysmal atrial fibrillation: Secondary | ICD-10-CM

## 2021-06-10 DIAGNOSIS — I63213 Cerebral infarction due to unspecified occlusion or stenosis of bilateral vertebral arteries: Secondary | ICD-10-CM | POA: Diagnosis not present

## 2021-06-10 MED ORDER — DILTIAZEM HCL ER COATED BEADS 120 MG PO CP24: 120.0000 mg | ORAL_CAPSULE | Freq: Every day | ORAL | 3 refills | Status: DC

## 2021-06-10 NOTE — Patient Instructions (Signed)
Medication Instructions:   STOP METOPROLOL  START DILTIAZEM CD 120 MG ONCE DAILY  *If you need a refill on your cardiac medications before your next appointment, please call your pharmacy*   Follow-Up: At [facility], you and your health needs are our priority.  As part of our continuing mission to provide you with exceptional heart care, we have created designated Provider Care Teams.  These Care Teams include your primary Cardiologist (physician) and Advanced Practice Providers (APPs -  Physician Assistants and Nurse Practitioners) who all work together to provide you with the care you need, when you need it.  We recommend signing up for the patient portal called "MyChart".  Sign up information is provided on this After Visit Summary.  MyChart is used to connect with patients for Virtual Visits (Telemedicine).  Patients are able to view lab/test results, encounter notes, upcoming appointments, etc.  Non-urgent messages can be sent to your provider as well.   To learn more about what you can do with MyChart, go to [email or web address].    Your next appointment:   4 month(s)  The format for your next appointment:   In Person  Provider:   Kirk Ruths, MD

## 2021-06-12 ENCOUNTER — Encounter: Payer: Self-pay | Admitting: Neurology

## 2021-06-12 ENCOUNTER — Ambulatory Visit (INDEPENDENT_AMBULATORY_CARE_PROVIDER_SITE_OTHER): Payer: Medicare Other | Admitting: Neurology

## 2021-06-12 ENCOUNTER — Other Ambulatory Visit: Payer: Self-pay

## 2021-06-12 VITALS — BP 135/77 | HR 54 | Ht 62.0 in | Wt 136.8 lb

## 2021-06-12 DIAGNOSIS — I63213 Cerebral infarction due to unspecified occlusion or stenosis of bilateral vertebral arteries: Secondary | ICD-10-CM

## 2021-06-12 DIAGNOSIS — I21A9 Other myocardial infarction type: Secondary | ICD-10-CM | POA: Diagnosis not present

## 2021-06-12 DIAGNOSIS — I6930 Unspecified sequelae of cerebral infarction: Secondary | ICD-10-CM

## 2021-06-12 DIAGNOSIS — I480 Paroxysmal atrial fibrillation: Secondary | ICD-10-CM | POA: Diagnosis not present

## 2021-06-12 NOTE — Progress Notes (Signed)
[facility] [address] [place]. [place] [address] [phone]       OFFICE FOLLOW UP VISIT NOTE  Ms. [name] Date of Birth:  19-Feb-1941 Medical Record Number:  [id number]   Referring MD:  Berle Mull Reason for Referral:  stroke  HPI: Initial visit 12/12/2020 :Tricia Newman is a pleasant 80 year old Caucasian lady seen today for initial office consultation visit for stroke.  History is obtained from the patient and her son [name] as well as review of electronic medical records and I personally reviewed pertinent imaging films in PACS.  Patient has past medical history significant for hypertension, hematemesis, migraine headaches who presented to [facility] emergency room via EMS on 10/05/2020 for altered mental status.  She was last seen by [place] in a few days ago and was fine.  Neighbors called EMS as they found patient in a driver to be confused and perseverating and having speech difficulties and disoriented.  CT scan of the head was unremarkable and MRI scan of the brain was obtained which showed bithalamic infarcts left greater than right as well as involving the right hippocampus.  CT angiogram showed severe right PCA and moderate left P2 stenosis.  CT angio of the neck showed no significant extracranial stenosis.  2D echo showed ejection fraction 6065%.  LDL cholesterol 99 mg percent and hemoglobin A1c was 4.9.  Patient was found to be new onset of a flutter and started on anticoagulation with Eliquis.  Patient did quite well and the confusion disorientation and speech improved significantly back to baseline quickly.  She was also started on beta-blocker.  Patient is back to her baseline to living at home she is independent she has no residual deficits from the stroke.  She is tolerating Eliquis well without bleeding with only minor bruising.  She is following up with a cardiologist.  Her blood pressure is well controlled and today it is 124/77.  She is tolerating Lipitor  well without muscle aches and pains. Update 8/242022 : She returns for follow-up after last visit 6 months ago.  She states she is doing well.  She is in no recurrent stroke or TIA symptoms.  She is back in paroxysmal A. fib and recently saw cardiologist who plans to restart Cardizem and she was complaining of fatigue and tiredness on beta-blockers.  She remains on Eliquis which she is tolerating well without bruising or bleeding.  She remains on Lipitor which is tolerating well without muscle aches and pains.  She has no new complaints.  She has not had any follow-up lipid profile checked her carotid ultrasound done since admission for stroke in December 2021 ROS:   14 system review of systems is positive for  Confusion, disorientation, speech difficulty and all other systems negative. PMH:  Past Medical History:  Diagnosis Date   Allergic rhinitis    CVA (cerebral vascular accident) ([place])    Hypertension    Migraine headache    Osteoporosis    PMS (premenstrual syndrome)    Rosacea     Social History:  Social History   Socioeconomic History   Marital status: Widowed    Spouse name: Not on file   Number of children: 2   Years of education: Not on file   Highest education level: Not on file  Occupational History   Not on file  Tobacco Use   Smoking status: Former    Types: Cigarettes    Quit date: 10/20/1976    Years since quitting: 54.6  Smokeless tobacco: Never  Substance and Sexual Activity   Alcohol use: No   Drug use: No   Sexual activity: Not on file  Other Topics Concern   Not on file  Social History Narrative   Lives alone   Right handed   Drinks no caffeine daily   Social Determinants of Health   Financial Resource Strain: Low Risk    Difficulty of Paying Living Expenses: Not hard at all  Food Insecurity: Not on file  Transportation Needs: No Transportation Needs   Lack of Transportation (Medical): No   Lack of Transportation (Non-Medical): No  Physical  Activity: Not on file  Stress: Not on file  Social Connections: Not on file  Intimate Partner Violence: Not on file    Medications:   Current Outpatient Medications on File Prior to Visit  Medication Sig Dispense Refill   acetaminophen (TYLENOL) 500 MG tablet Take 500 mg by mouth every 6 (six) hours as needed.     atorvastatin (LIPITOR) 40 MG tablet Take 1 tablet (40 mg total) by mouth daily. 90 tablet 3   Calcium Carbonate-Vitamin D (CALCIUM 600 + D PO) Take 1 tablet by mouth daily.     ELIQUIS 5 MG TABS tablet TAKE 1 TABLET BY MOUTH TWICE A DAY 180 tablet 3   loratadine (CLARITIN) 10 MG tablet Take 10 mg by mouth daily.     metoprolol succinate (TOPROL-XL) 50 MG 24 hr tablet Take 50 mg by mouth daily. Take with or immediately following a meal.     Multiple Vitamins-Minerals (CENTRUM SILVER PO) Take 1 tablet by mouth daily.     tiZANidine (ZANAFLEX) 4 MG tablet Take 0.5-1 tablets (2-4 mg total) by mouth every 12 (twelve) hours as needed for muscle spasms. Please establish with a new provider for further refills 60 tablet 1   traMADol (ULTRAM) 50 MG tablet TAKE 1 TABLET BY MOUTH AT BEDTIME AS NEEDED FOR PAIN 30 tablet 1   No current facility-administered medications on file prior to visit.    Allergies:  No Known Allergies  Physical Exam General:pleasant elderly caucasian , seated, in no evident distress Head: head normocephalic and atraumatic.   Neck: supple with no carotid or supraclavicular bruits Cardiovascular: regular rate and rhythm, no murmurs Musculoskeletal: no deformity Skin:  no rash/petichiae Vascular:  Normal pulses all extremities  Neurologic Exam Mental Status: Awake and fully alert. Oriented to place and time. Recent and remote memory intact. Attention span, concentration and fund of knowledge appropriate. Mood and affect appropriate.  Cranial Nerves: Fundoscopic exam not done. Pupils equal, briskly reactive to light. Extraocular movements full without nystagmus.  Visual fields full to confrontation. Hearing intact. Facial sensation intact. Face, tongue, palate moves normally and symmetrically.  Motor: Normal bulk and tone. Normal strength in all tested extremity muscles. Sensory.: intact to touch , pinprick , position and vibratory sensation.  Coordination: Rapid alternating movements normal in all extremities. Finger-to-nose and heel-to-shin performed accurately bilaterally. Gait and Station: Arises from chair without difficulty. Stance is normal. Gait demonstrates normal stride length and balance . Able to heel, toe and tandem walk with mild t difficulty.  Reflexes: 1+ and symmetric. Toes downgoing.      ASSESSMENT: 80 year old pleasant Caucasian lady with bilateral posterior cerebral artery embolic strokes and December 2021 from new onset atrial flutter.  She is doing remarkably well with no residual deficits.  Vascular risk factors of atrial flutter, hyperlipidemia and age     PLAN: I had a long d/w patient about  her remote embolic  stroke, paroxysmal atrial fibrillation, risk for recurrent stroke/TIAs, personally independently reviewed imaging studies and stroke evaluation results and answered questions.Continue Eliquis (apixaban) 5 mg twice daily  for secondary stroke prevention and maintain strict control of hypertension with blood pressure goal below 130/90, diabetes with hemoglobin A1c goal below 6.5% and lipids with LDL cholesterol goal below 70 mg/dL. I also advised the patient to eat a healthy diet with plenty of whole grains, cereals, fruits and vegetables, exercise regularly and maintain ideal body weight check follow-up lipid profile, hemoglobin A1c and carotid ultrasound.  Followup in the future with my nurse practitioner Janett Billow in 6 months or call earlier if necessary.Greater than 50% time during this 25-minute  visit with spent in counseling and coordination of care about her embolic strokes and atrial flutter and answering  questions. Antony Contras, MD  [facility] [address] [place], [place] [address]  Phone [phone] Fax [phone] Note: This document was prepared with digital dictation and possible smart phrase technology. Any transcriptional errors that result from this process are unintentional.

## 2021-06-12 NOTE — Patient Instructions (Signed)
I had a long d/w patient about her remote embolic  stroke, paroxysmal atrial fibrillation, risk for recurrent stroke/TIAs, personally independently reviewed imaging studies and stroke evaluation results and answered questions.Continue Eliquis (apixaban) 5 mg twice daily  for secondary stroke prevention and maintain strict control of hypertension with blood pressure goal below 130/90, diabetes with hemoglobin A1c goal below 6.5% and lipids with LDL cholesterol goal below 70 mg/dL. I also advised the patient to eat a healthy diet with plenty of whole grains, cereals, fruits and vegetables, exercise regularly and maintain ideal body weight check follow-up lipid profile, hemoglobin A1c and carotid ultrasound.  Followup in the future with my nurse practitioner Janett Billow in 6 months or call earlier if necessary. Stroke Prevention Some medical conditions and behaviors are associated with a higher chance of having a stroke. You can help prevent a stroke by making nutrition, lifestyle,and other changes, including managing any medical conditions you may have. What nutrition changes can be made?  Eat healthy foods. You can do this by: Choosing foods high in fiber, such as fresh fruits and vegetables and whole grains. Eating at least 5 or more servings of fruits and vegetables a day. Try to fill half of your plate at each meal with fruits and vegetables. Choosing lean protein foods, such as lean cuts of meat, poultry without skin, fish, tofu, beans, and nuts. Eating low-fat dairy products. Avoiding foods that are high in salt (sodium). This can help lower blood pressure. Avoiding foods that have saturated fat, trans fat, and cholesterol. This can help prevent high cholesterol. Avoiding processed and premade foods. Follow your health care provider's specific guidelines for losing weight, controlling high blood pressure (hypertension), lowering high cholesterol, and managing diabetes. These may include: Reducing your  daily calorie intake. Limiting your daily sodium intake to 1,500 milligrams (mg). Using only healthy fats for cooking, such as olive oil, canola oil, or sunflower oil. Counting your daily carbohydrate intake. What lifestyle changes can be made? Maintain a healthy weight. Talk to your health care provider about your ideal weight. Get at least 30 minutes of moderate physical activity at least 5 days a week. Moderate activity includes brisk walking, biking, and swimming. Do not use any products that contain nicotine or tobacco, such as cigarettes and e-cigarettes. If you need help quitting, ask your health care provider. It may also be helpful to avoid exposure to secondhand smoke. Limit alcohol intake to no more than 1 drink a day for nonpregnant women and 2 drinks a day for men. One drink equals 12 oz of beer, 5 oz of wine, or 1 oz of hard liquor. Stop any illegal drug use. Avoid taking birth control pills. Talk to your health care provider about the risks of taking birth control pills if: You are over 2 years old. You smoke. You get migraines. You have ever had a blood clot. What other changes can be made? Manage your cholesterol levels. Eating a healthy diet is important for preventing high cholesterol. If cholesterol cannot be managed through diet alone, you may also need to take medicines. Take any prescribed medicines to control your cholesterol as told by your health care provider. Manage your diabetes. Eating a healthy diet and exercising regularly are important parts of managing your blood sugar. If your blood sugar cannot be managed through diet and exercise, you may need to take medicines. Take any prescribed medicines to control your diabetes as told by your health care provider. Control your hypertension. To reduce your risk of  stroke, try to keep your blood pressure below 130/80. Eating a healthy diet and exercising regularly are an important part of controlling your blood  pressure. If your blood pressure cannot be managed through diet and exercise, you may need to take medicines. Take any prescribed medicines to control hypertension as told by your health care provider. Ask your health care provider if you should monitor your blood pressure at home. Have your blood pressure checked every year, even if your blood pressure is normal. Blood pressure increases with age and some medical conditions. Get evaluated for sleep disorders (sleep apnea). Talk to your health care provider about getting a sleep evaluation if you snore a lot or have excessive sleepiness. Take over-the-counter and prescription medicines only as told by your health care provider. Aspirin or blood thinners (antiplatelets or anticoagulants) may be recommended to reduce your risk of forming blood clots that can lead to stroke. Make sure that any other medical conditions you have, such as atrial fibrillation or atherosclerosis, are managed. What are the warning signs of a stroke? The warning signs of a stroke can be easily remembered as BEFAST. B is for balance. Signs include: Dizziness. Loss of balance or coordination. Sudden trouble walking. E is for eyes. Signs include: A sudden change in vision. Trouble seeing. F is for face. Signs include: Sudden weakness or numbness of the face. The face or eyelid drooping to one side. A is for arms. Signs include: Sudden weakness or numbness of the arm, usually on one side of the body. S is for speech. Signs include: Trouble speaking (aphasia). Trouble understanding. T is for time. These symptoms may represent a serious problem that is an emergency. Do not wait to see if the symptoms will go away. Get medical help right away. Call your local emergency services (911 in the [place] Do not drive yourself to the hospital. Other signs of stroke may include: A sudden, severe headache with no known cause. Nausea or vomiting. Seizure. Where to find more  information For more information, visit: American Stroke Association: www.strokeassociation.org National Stroke Association: www.stroke.org Summary You can prevent a stroke by eating healthy, exercising, not smoking, limiting alcohol intake, and managing any medical conditions you may have. Do not use any products that contain nicotine or tobacco, such as cigarettes and e-cigarettes. If you need help quitting, ask your health care provider. It may also be helpful to avoid exposure to secondhand smoke. Remember BEFAST for warning signs of stroke. Get help right away if you or a loved one has any of these signs. This information is not intended to replace advice given to you by your health care provider. Make sure you discuss any questions you have with your healthcare provider. Document Revised: 09/18/2017 Document Reviewed: 11/11/2016 Elsevier Patient Education  2021 [employer].

## 2021-06-13 ENCOUNTER — Telehealth: Payer: Self-pay | Admitting: Emergency Medicine

## 2021-06-13 ENCOUNTER — Telehealth: Payer: Self-pay | Admitting: *Deleted

## 2021-06-13 LAB — LIPID PANEL
Chol/HDL Ratio: 1.5 ratio (ref 0.0–4.4)
Cholesterol, Total: 111 mg/dL (ref 100–199)
HDL: 75 mg/dL (ref >39)
LDL Chol Calc (NIH): 26 mg/dL (ref 0–99)
Triglycerides: 36 mg/dL (ref 0–149)
VLDL Cholesterol Cal: 10 mg/dL (ref 5–40)

## 2021-06-13 LAB — HEMOGLOBIN A1C
Est. average glucose Bld gHb Est-mCnc: 114 mg/dL
Hgb A1c MFr Bld: 5.6 % (ref 4.8–5.6)

## 2021-06-13 NOTE — Telephone Encounter (Signed)
LVM for patient to return call.    If patient calls back, please inform her:    Kindly inform patient that cholesterol profile and screening test for diabetes were both satisfactory   Thank you Tricia Newman

## 2021-06-13 NOTE — Telephone Encounter (Signed)
Pt returned call and was informed of her results. Pt verbalized appreciation.

## 2021-06-13 NOTE — Telephone Encounter (Signed)
-----   Message from [name], MD sent at 06/13/2021  6:52 AM EDT ----- Mitchell Heir inform patient that cholesterol profile and screening test for diabetes were both satisfactory ----- Message ----- From: Interface, [facility] Lab Results In Sent: 06/13/2021   5:37 AM EDT To: [name], MD

## 2021-06-13 NOTE — Telephone Encounter (Signed)
-----   Message from [name], MD sent at 06/13/2021  6:52 AM EDT ----- Tricia Newman inform patient that cholesterol profile and screening test for diabetes were both satisfactory ----- Message ----- From: Interface, [facility] Lab Results In Sent: 06/13/2021   5:37 AM EDT To: [name], MD

## 2021-06-13 NOTE — Telephone Encounter (Signed)
I spoke to patient. She verbalized understanding of the lab findings.

## 2021-06-19 IMAGING — CT CT HEAD W/O CM
4 series · 16 of 47 positions shown, 18 images · non-contrast
Comparison: None.

CLINICAL DATA: Altered mental status

EXAM:
CT HEAD WITHOUT CONTRAST
TECHNIQUE: Contiguous axial images were obtained from the base of the skull
through the vertex without intravenous contrast.

[Series 3: head without · axial · non-contrast · 0.44mm/px · z∈[-109,+11]mm · 7 of 33 slices shown, 9 images]
[im 5/33  brain]
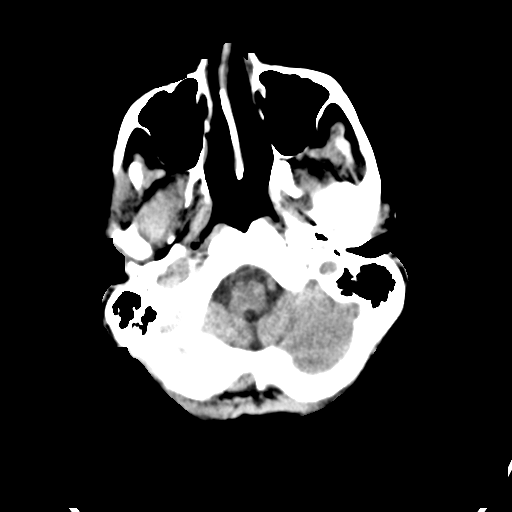
[im 5/33  bone]
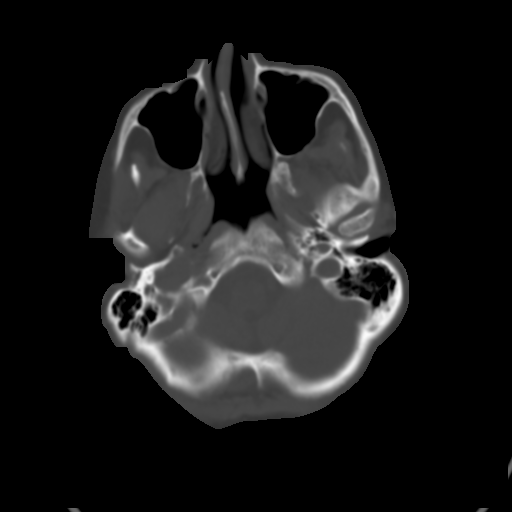
[im 9/33  brain]
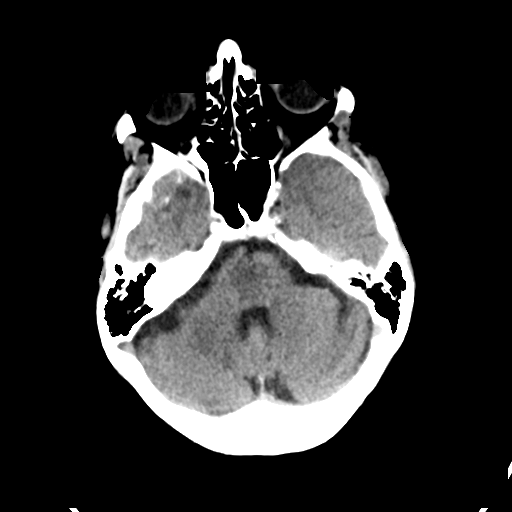
[im 13/33  brain]
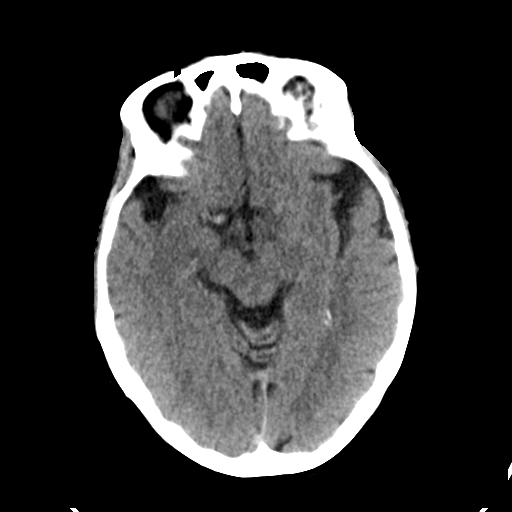
[im 17/33  brain]
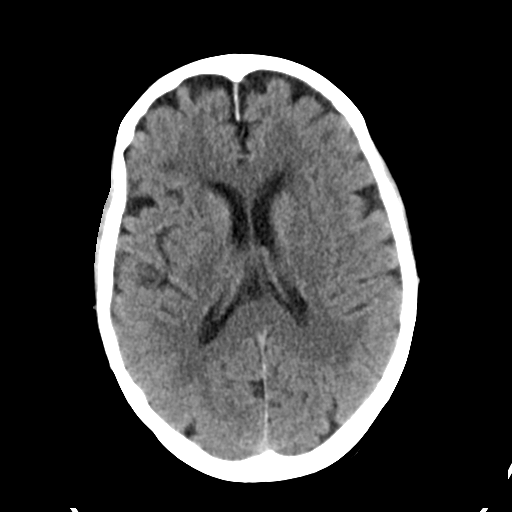
[im 21/33  brain]
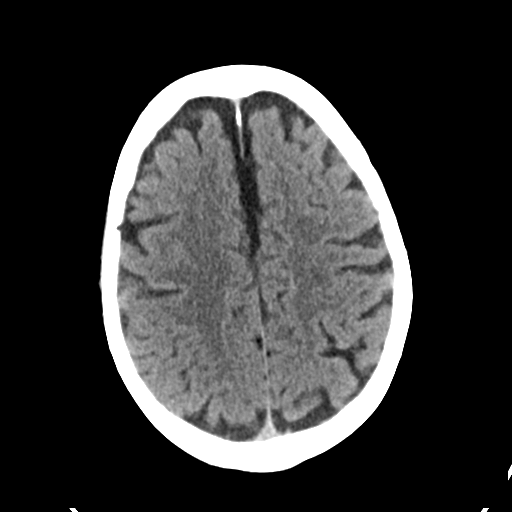
[im 21/33  bone]
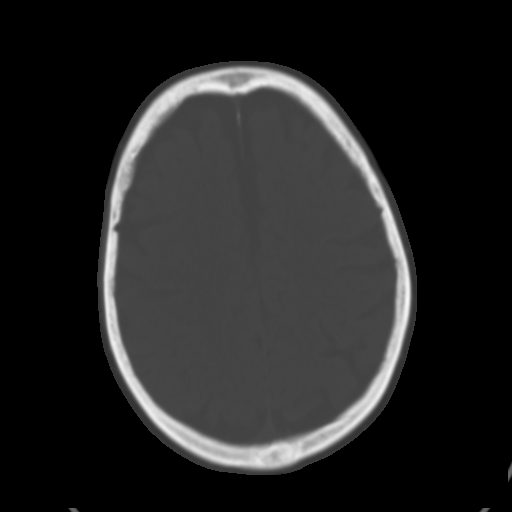
[im 25/33  brain]
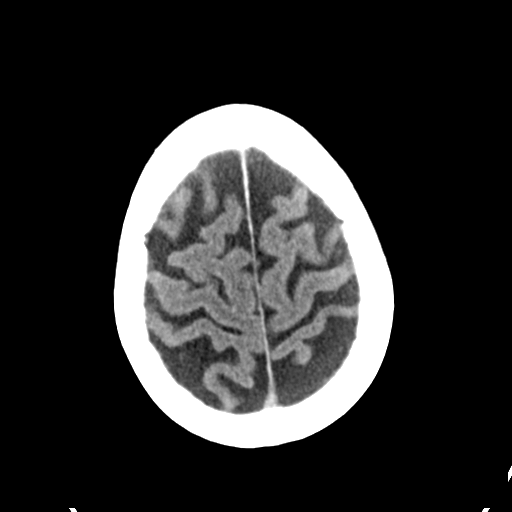
[im 29/33  brain]
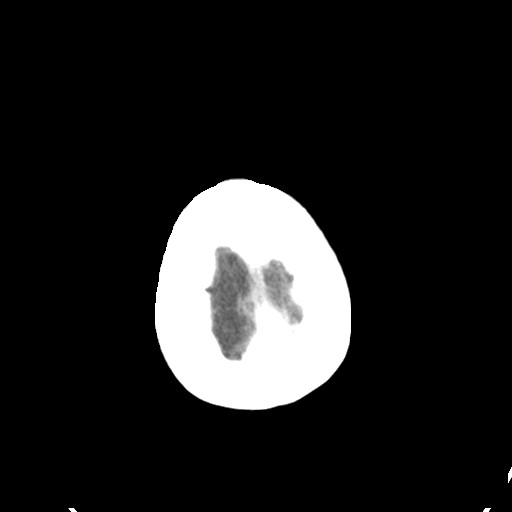

[Series 4: head bone · axial · 0.44mm/px · z∈[-113,-81]mm · 3 of 83 slices shown]
[im 9/83  bone]
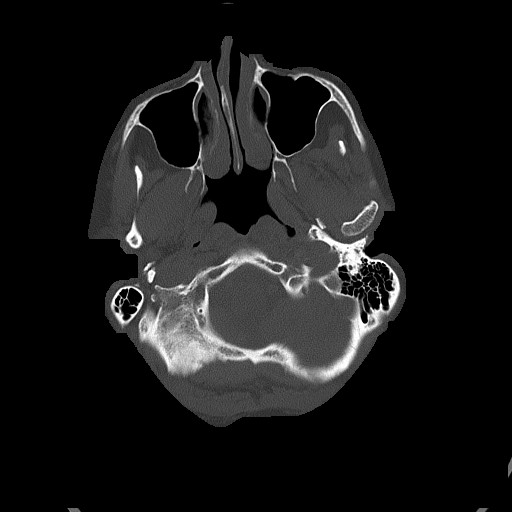
[im 17/83  bone]
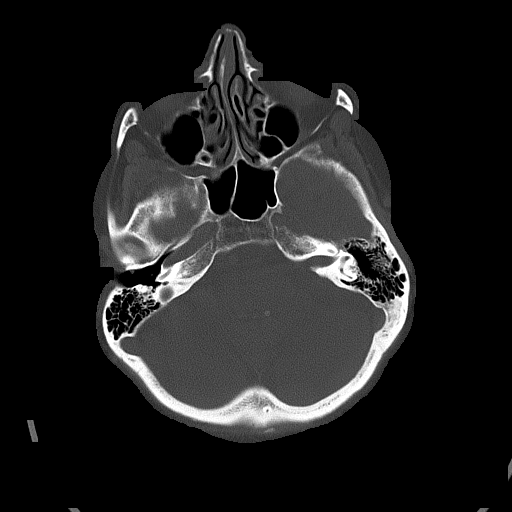
[im 25/83  bone]
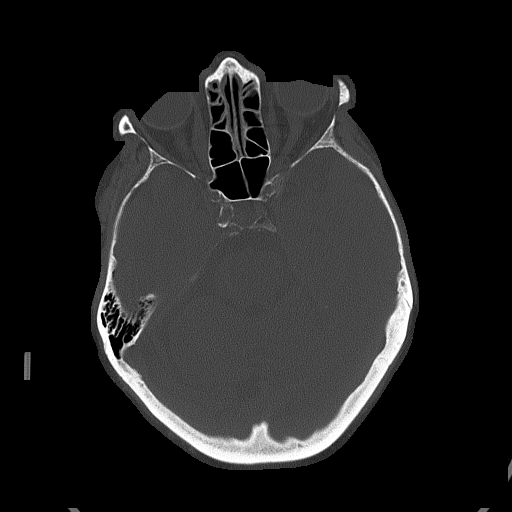

[Series 5: head without cor · coronal · non-contrast · 0.33mm/px · 3 of 70 slices shown]
[im 24/70  brain]
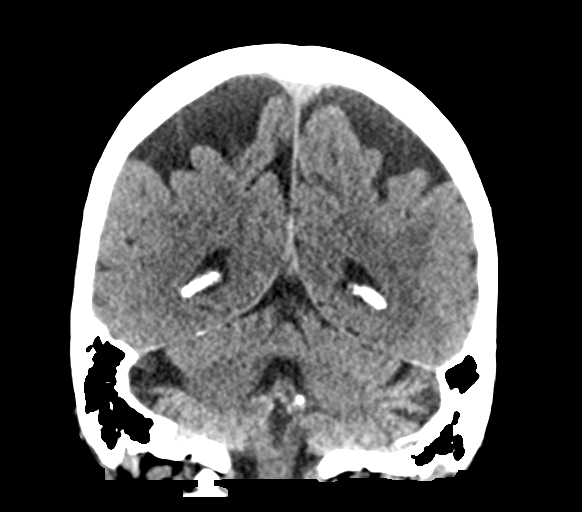
[im 31/70  brain]
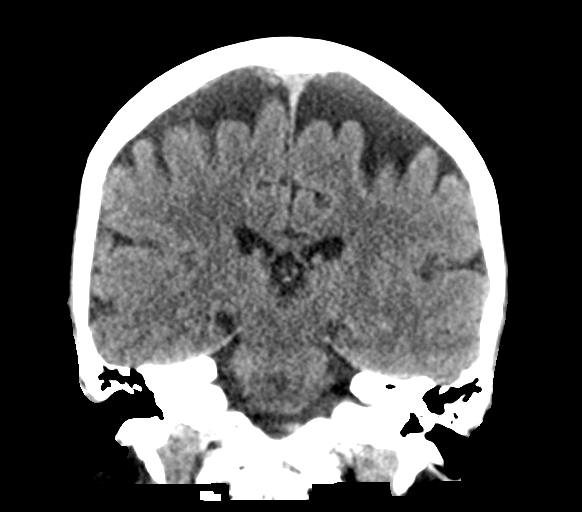
[im 39/70  brain]
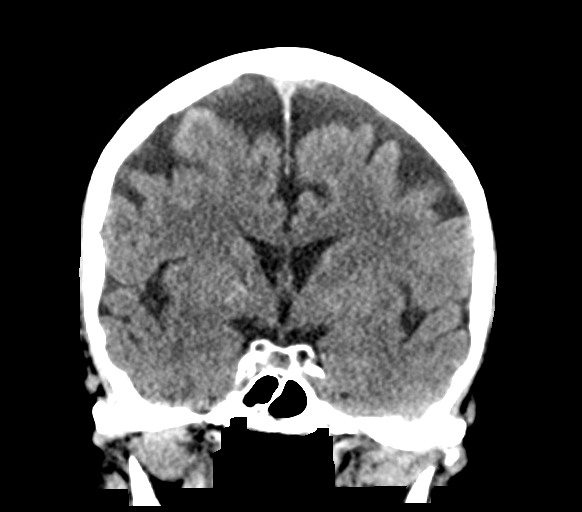

[Series 6: head without sag · sagittal · non-contrast · 0.32mm/px · 3 of 67 slices shown]
[im 23/67  brain]
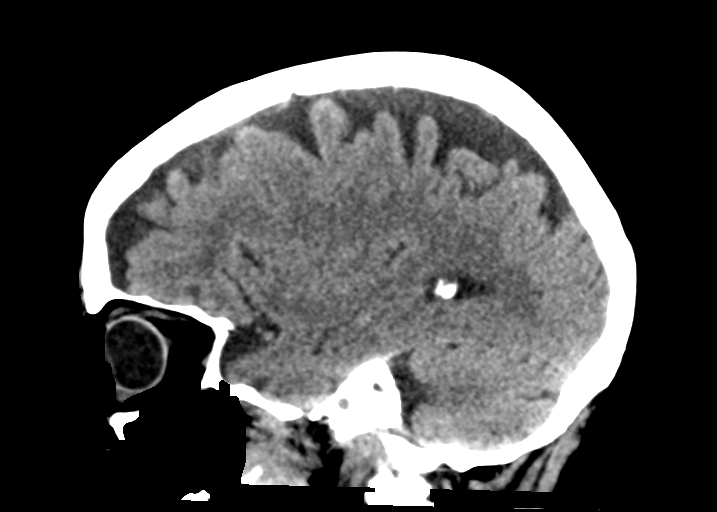
[im 34/67  brain]
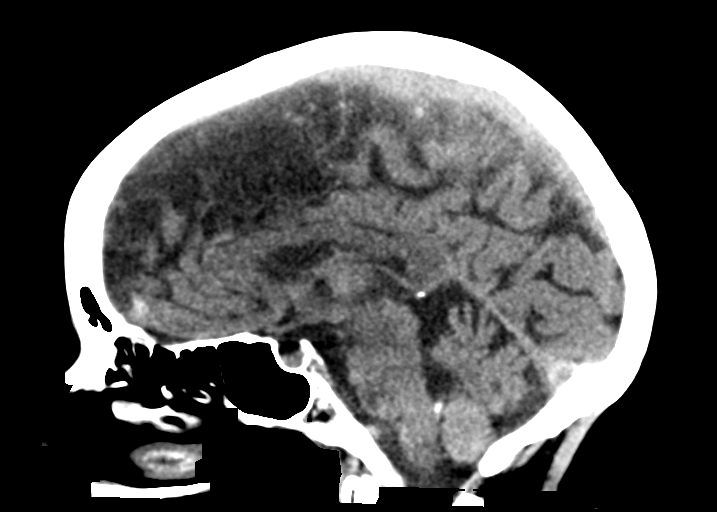
[im 45/67  brain]
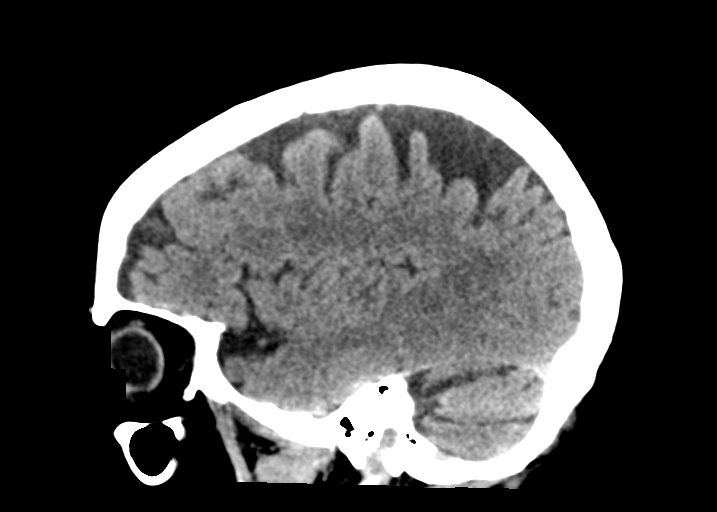

[16 of 47 positions shown; findings below may reference images not displayed]

FINDINGS: Brain: No evidence of acute infarction, hemorrhage, hydrocephalus,
extra-axial collection or mass lesion/mass effect. Mild atrophic and
chronic white matter ischemic changes are noted.

Vascular: No hyperdense vessel or unexpected calcification.

Skull: Normal. Negative for fracture or focal lesion.

Sinuses/Orbits: No acute finding.

Other: None.
IMPRESSION: Chronic atrophic and ischemic changes without acute abnormality.

## 2021-06-19 IMAGING — CT CT ANGIO NECK
2 of 7 series · 8 of 33 positions shown · IV contrast (APPLIED)
Comparison: Brain MRI performed earlier today 10/05/2020. Head CT
10/05/2020.

CLINICAL DATA: Neuro deficit, acute, stroke suspected.

EXAM:
CT ANGIOGRAPHY HEAD AND NECK
TECHNIQUE: Multidetector CT imaging of the head and neck was performed using
the standard protocol during bolus administration of intravenous
contrast. Multiplanar CT image reconstructions and MIPs were
obtained to evaluate the vascular anatomy. Carotid stenosis
measurements (when applicable) are obtained utilizing NASCET
criteria, using the distal internal carotid diameter as the
denominator.
CONTRAST:  75mL OMNIPAQUE IOHEXOL 350 MG/ML SOLN

[Series 5: cta neck/head · axial · 0.42mm/px · z∈[-78,+38]mm · 2 of 175 slices shown]
[im 59/175  soft-tissue]
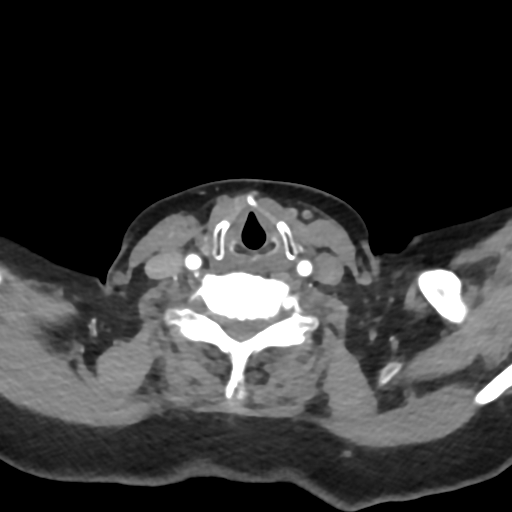
[im 117/175  soft-tissue]
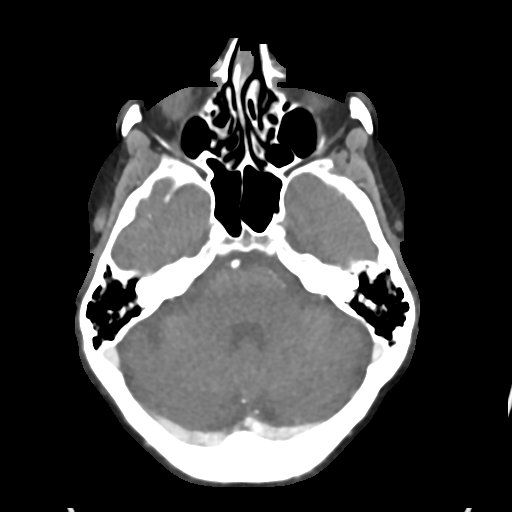

[Series 7: ax thins · axial · 0.39mm/px · z∈[-142,+104]mm · 6 of 346 slices shown]
[im 50/346  soft-tissue]
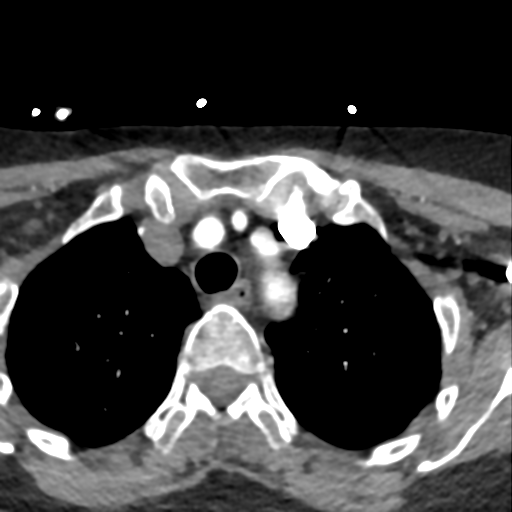
[im 99/346  bone]
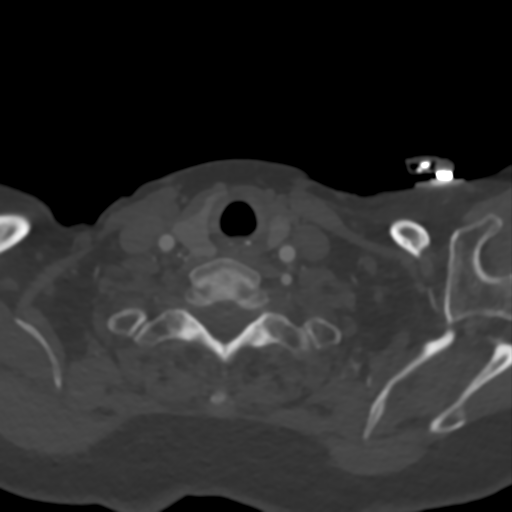
[im 148/346  soft-tissue]
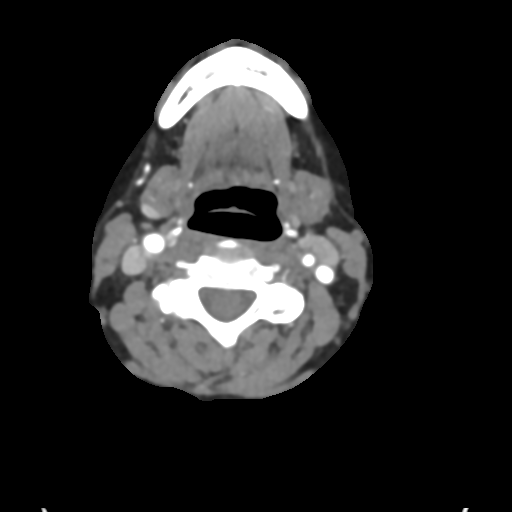
[im 198/346  bone]
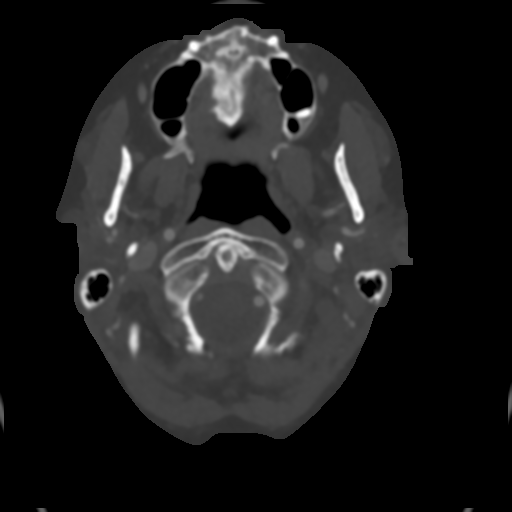
[im 247/346  soft-tissue]
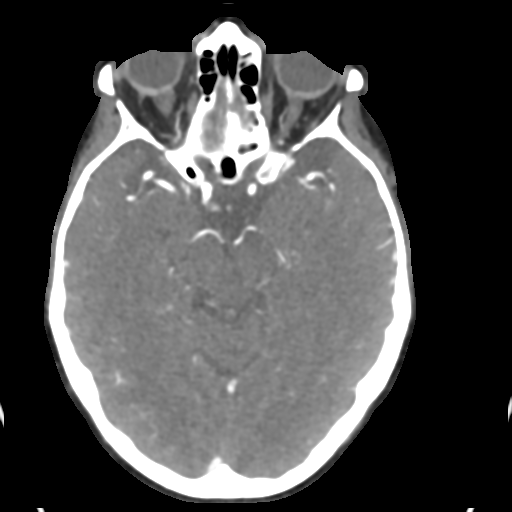
[im 296/346  bone]
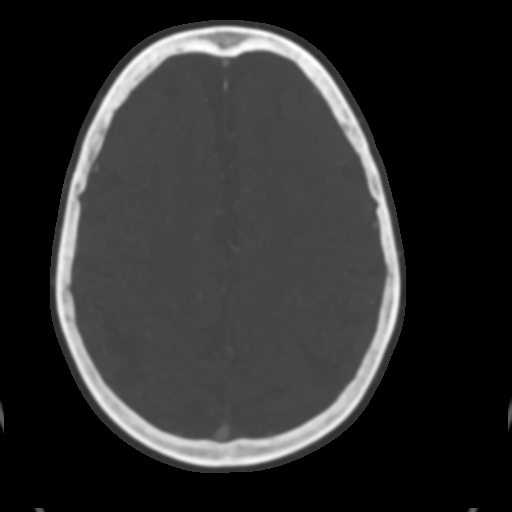

[8 of 33 positions shown; findings below may reference images not displayed]

FINDINGS: CTA NECK FINDINGS

Aortic arch: Standard aortic branching. Atherosclerotic plaque
within the visualized aortic arch and proximal major branch vessels
of the neck. No hemodynamically significant innominate or proximal
subclavian artery stenosis.

Right carotid system: CCA and ICA patent within the neck without
stenosis. No significant atherosclerotic disease. Carotid web within
the right carotid bulb (series 9, image 60).

Left carotid system: CCA and ICA patent within the neck without
stenosis. No significant atherosclerotic disease.

Vertebral arteries: Patent within the neck without stenosis. The
left vertebral artery is dominant. The right vertebral artery is
developmentally diminutive.

Skeleton: No acute bony abnormality or aggressive osseous lesion.
Reversal of the expected cervical lordosis. C3-C4 and C4-C5 grade 1
anterolisthesis.

Other neck: No neck mass or cervical lymphadenopathy. Subcentimeter
thyroid nodules not meeting consensus criteria for ultrasound
follow-up.

Upper chest: No consolidation within the imaged lung apices.

Review of the MIP images confirms the above findings

CTA HEAD FINDINGS

Anterior circulation:

The intracranial internal carotid arteries are patent. Calcified
plaque within both vessels with no more than mild stenosis. The M1
middle cerebral arteries are patent. No M2 proximal branch occlusion
or high-grade proximal stenosis is identified. The anterior cerebral
arteries are patent. No intracranial aneurysm is identified.

Posterior circulation:

Diminutive appearance of the intracranial right vertebral artery
beyond the right PICA origin, and of the proximal right PICA. The
dominant intracranial left vertebral artery is patent without
stenosis, as is the basilar artery. Apparent 6 mm flow gap within
the P2 left posterior cerebral artery. (Series 10, image 21). Severe
focal stenosis within the right PCA at the P2/P3 junction (series
10, image 22) (series 12, image 15). Posterior communicating
arteries are hypoplastic or absent bilaterally.

Venous sinuses: Within the limitations of contrast timing, no
convincing thrombus.

Anatomic variants: As described

Review of the MIP images confirms the above findings

These results were called by telephone at the time of interpretation
on 10/05/2020 at [DATE]o pm to provider Dr. Dosanjh, who verbally
acknowledged these results.
IMPRESSION: CTA neck:

1. The bilateral common and internal carotid arteries are patent
within the neck without stenosis or significant atherosclerotic
disease. Carotid web within the right ICA bulb.
2. Vertebral arteries patent within the neck without stenosis. The
left vertebral artery is significantly dominant. The right vertebral
artery is developmentally diminutive.

CTA head:

1. Diminutive appearance of the non-dominant intracranial right
vertebral artery beyond the right PICA origin, and of the proximal
right PICA. Findings are indeterminate in etiology and could be
secondary to developmentally small vessel size or atherosclerotic
stenosis. Dissection is difficult to definitively exclude, but this
would be an atypical location.
2. 6 mm flow gap within the P2 left posterior cerebral artery. This
may reflect occlusion with distal reconstitution or severe stenosis.
3. Severe right PCA stenosis at the P2/P3 junction.

## 2021-06-19 IMAGING — MR MR HEAD W/O CM
9 of 10 series · 38 of 48 positions shown · non-contrast
Comparison: CT head 10/05/2020

CLINICAL DATA: Acute neuro deficit

EXAM:
MRI HEAD WITHOUT CONTRAST
TECHNIQUE: Multiplanar, multiecho pulse sequences of the brain and surrounding
structures were obtained without intravenous contrast.

[Series 3: DWI · axial · 3.0mm · 1.09mm/px · z∈[-67,+90]mm · 11 of 108 slices shown (1 of 4)]
[im 1/108]
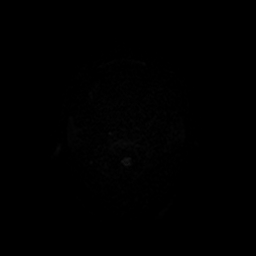
[im 11/108]
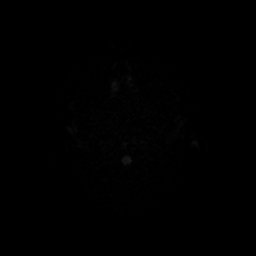
[im 22/108]
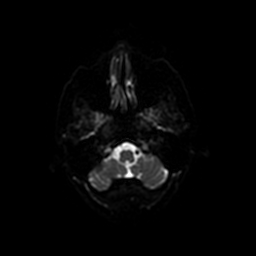
[im 33/108]
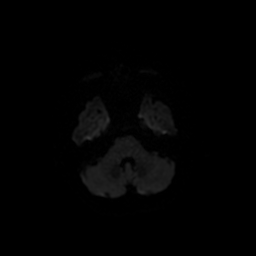
[im 43/108]
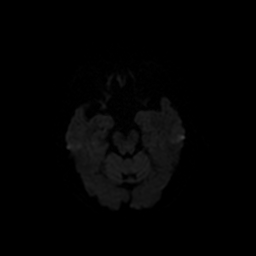
[im 54/108]
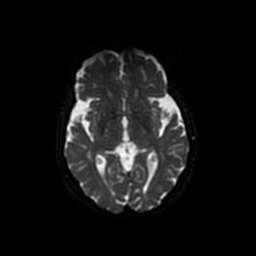
[im 65/108]
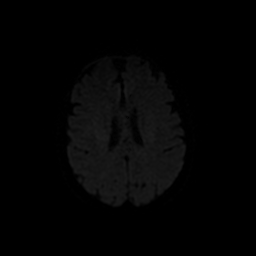
[im 75/108]
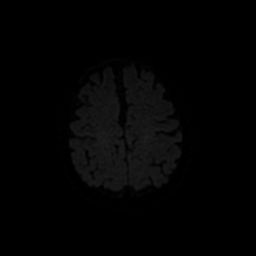
[im 86/108]
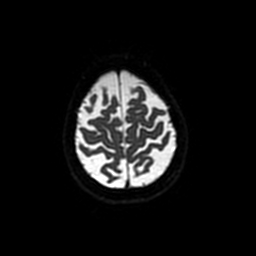
[im 97/108]
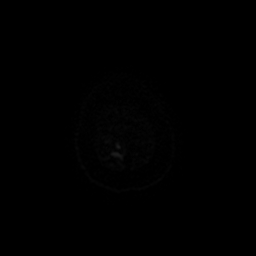
[im 108/108]
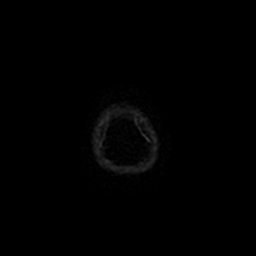

[Series 4: DWI · coronal · 5.0mm · 1.09mm/px · 8 of 76 slices shown (2 of 4)]
[im 1/76]
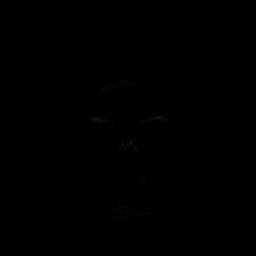
[im 11/76]
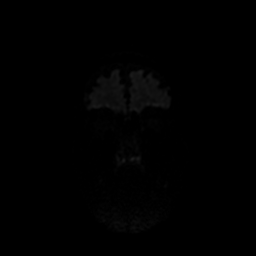
[im 22/76]
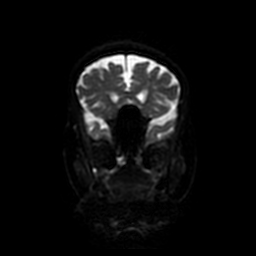
[im 33/76]
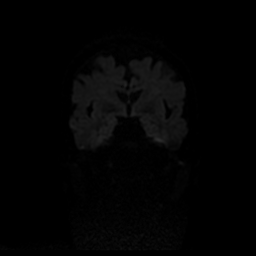
[im 43/76]
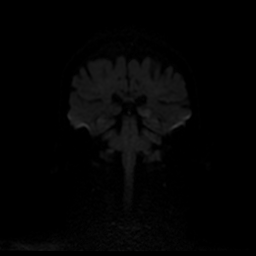
[im 54/76]
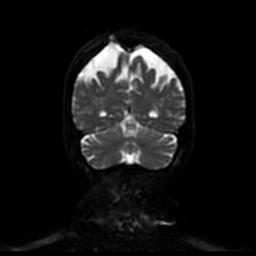
[im 65/76]
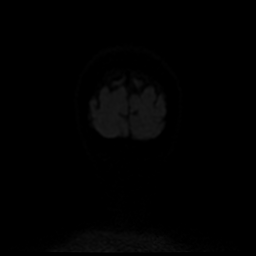
[im 76/76]
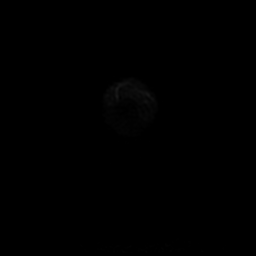

[Series 5: T1 · sagittal · 5.0mm · 0.47mm/px · 2 of 23 slices shown (1 of 2)]
[im 1/23]
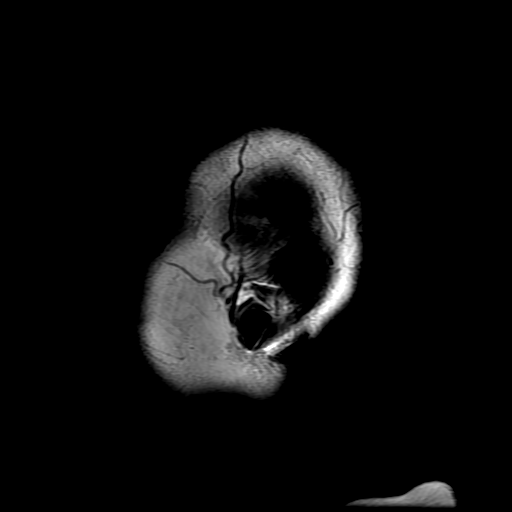
[im 23/23]
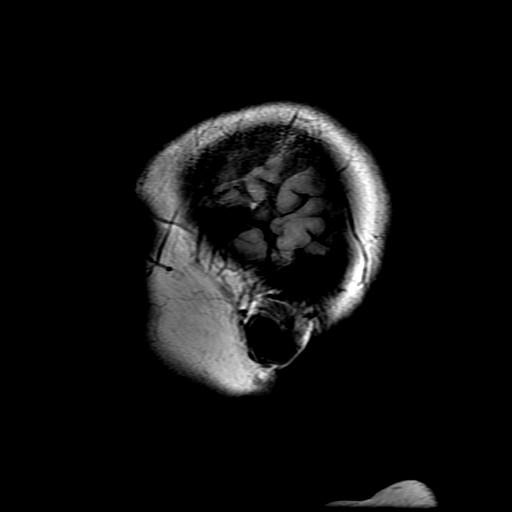

[Series 6: T2 · axial · 5.0mm · 0.43mm/px · z∈[-61,+80]mm · 2 of 25 slices shown (1 of 2)]
[im 1/25]
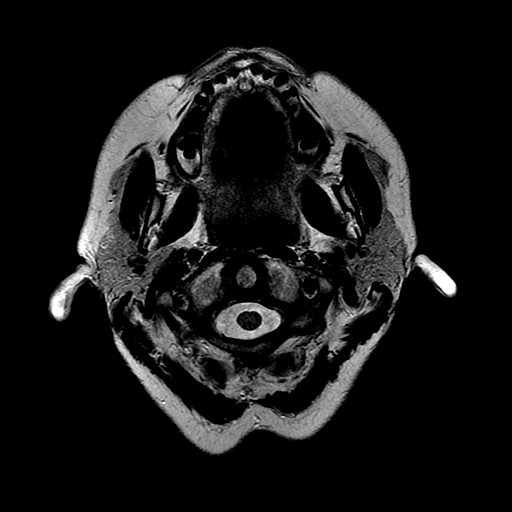
[im 25/25]
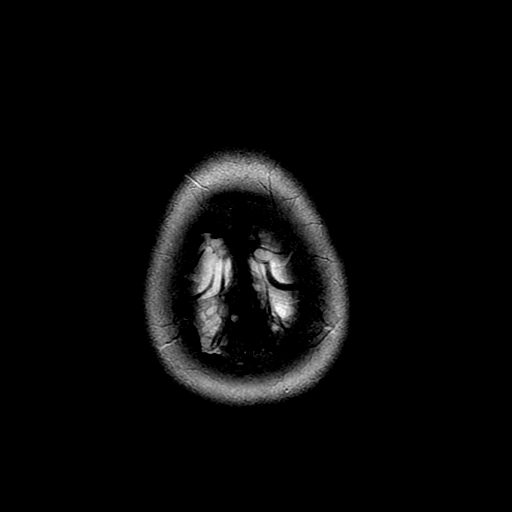

[Series 7: FLAIR · axial · 3.0mm · 0.43mm/px · z∈[-61,+80]mm · 2 of 25 slices shown]
[im 1/25]
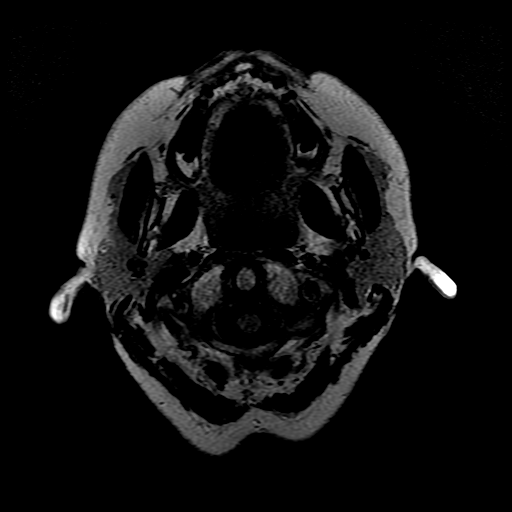
[im 25/25]
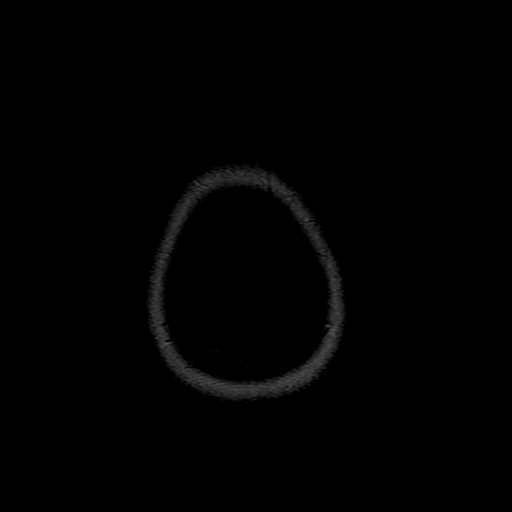

[Series 9: T1 · axial · 3.0mm · 0.47mm/px · z∈[-65,-48]mm · 2 of 100 slices shown (2 of 2)]
[im 1/100]
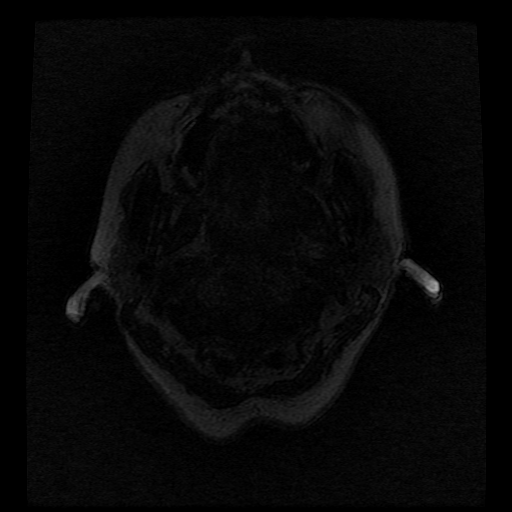
[im 12/100]
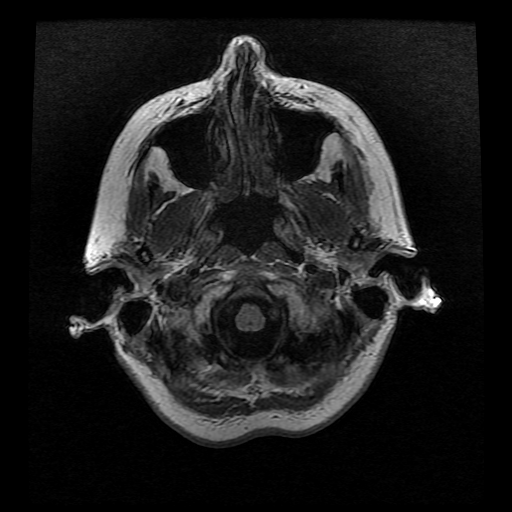

[Series 10: T2 · coronal · 5.0mm · 0.39mm/px · 2 of 25 slices shown (2 of 2)]
[im 1/25]
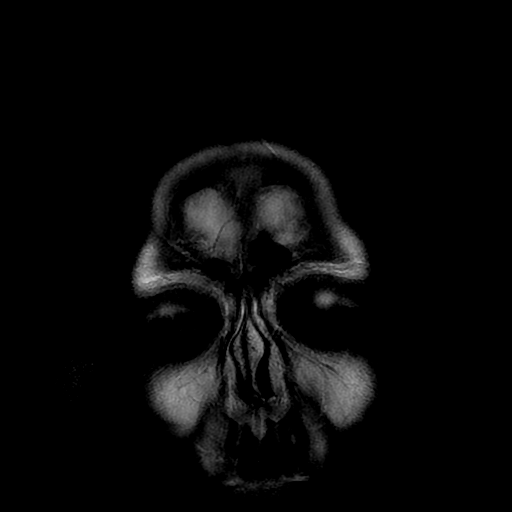
[im 25/25]
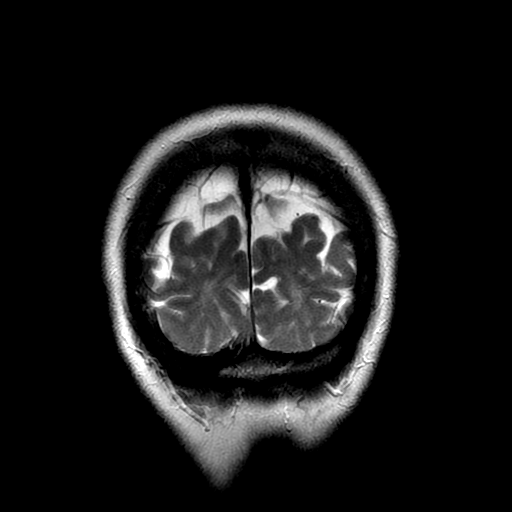

[Series 300: DWI · axial · 3.0mm · 1.09mm/px · z∈[-67,+90]mm · 5 of 54 slices shown (3 of 4)]
[im 1/54]
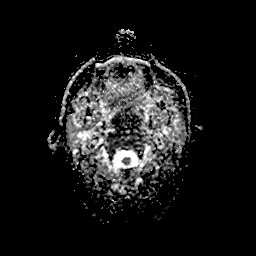
[im 14/54]
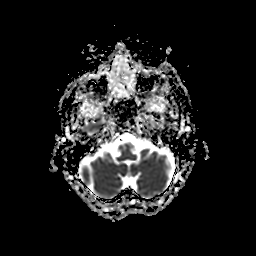
[im 27/54]
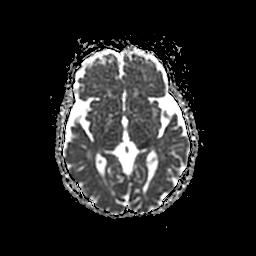
[im 40/54]
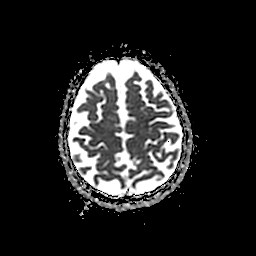
[im 54/54]
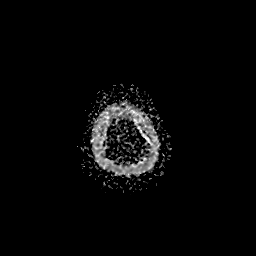

[Series 400: DWI · coronal · 5.0mm · 1.09mm/px · 4 of 38 slices shown (4 of 4)]
[im 1/38]
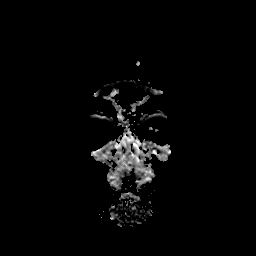
[im 13/38]
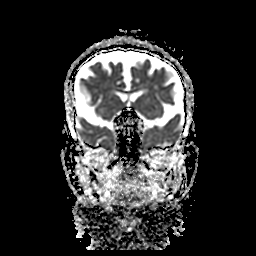
[im 25/38]
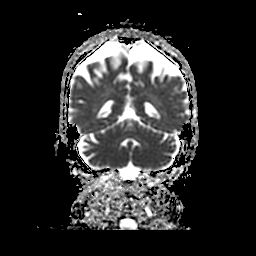
[im 38/38]
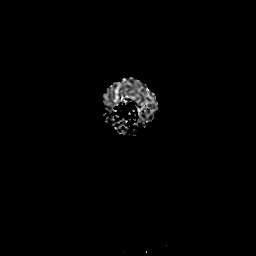

[38 of 48 positions shown; findings below may reference images not displayed]

FINDINGS: Brain: Restricted diffusion in the tail of the hippocampus on the
right. Small areas of restricted diffusion in the thalamus
bilaterally most notably in the left posterior thalamus. Midbrain
spared. No supratentorial acute infarct.

Generalized atrophy. Patchy deep white matter hyperintensities
bilaterally, mild-to-moderate in degree. Negative for hemorrhage or
mass.

Vascular: Normal arterial flow voids

Skull and upper cervical spine: No focal skeletal lesion.

Sinuses/Orbits: Paranasal sinuses clear. Bilateral cataract
extraction

Other: None
IMPRESSION: Patchy areas of restricted diffusion in the thalamus bilaterally
left greater than right, and in the tail the hippocampus on the
right. Midbrain spared. This is consistent with posterior
circulation infarct. Consider distal basilar thrombus or artery of
[place] incomplete infarction. CT angio head and neck recommended
for further evaluation.

Chronic microvascular ischemic change in the white matter.

## 2021-06-27 ENCOUNTER — Encounter: Payer: Self-pay | Admitting: Family Medicine

## 2021-06-27 ENCOUNTER — Telehealth (INDEPENDENT_AMBULATORY_CARE_PROVIDER_SITE_OTHER): Payer: Medicare Other | Admitting: Family Medicine

## 2021-06-27 ENCOUNTER — Other Ambulatory Visit: Payer: Self-pay

## 2021-06-27 ENCOUNTER — Other Ambulatory Visit: Payer: Self-pay | Admitting: Family Medicine

## 2021-06-27 DIAGNOSIS — R0981 Nasal congestion: Secondary | ICD-10-CM

## 2021-06-27 DIAGNOSIS — R519 Headache, unspecified: Secondary | ICD-10-CM | POA: Diagnosis not present

## 2021-06-27 MED ORDER — AMOXICILLIN-POT CLAVULANATE 875-125 MG PO TABS: 1.0000 | ORAL_TABLET | Freq: Two times a day (BID) | ORAL | 0 refills | Status: DC

## 2021-06-27 MED ORDER — BENZONATATE 100 MG PO CAPS: 100.0000 mg | ORAL_CAPSULE | Freq: Three times a day (TID) | ORAL | 0 refills | Status: DC | PRN

## 2021-06-27 NOTE — Patient Instructions (Signed)
-  I sent the medication(s) we discussed to your pharmacy: Meds ordered this encounter  Medications   benzonatate (TESSALON PERLES) 100 MG capsule    Sig: Take 1 capsule (100 mg total) by mouth 3 (three) times daily as needed.    Dispense:  20 capsule    Refill:  0   amoxicillin-clavulanate (AUGMENTIN) 875-125 MG tablet    Sig: Take 1 tablet by mouth 2 (two) times daily.    Dispense:  20 tablet    Refill:  0     I hope you are feeling better soon!  Seek in person care promptly if your symptoms worsen, new concerns arise or you are not improving with treatment.  It was nice to meet you today. I help [facility] out with telemedicine visits on Tuesdays and Thursdays and am available for visits on those days. If you have any concerns or questions following this visit please schedule a follow up visit with your Primary Care doctor or seek care at a local urgent care clinic to avoid delays in care.

## 2021-06-27 NOTE — Telephone Encounter (Signed)
Spoke with the patient and she is aware the Rx was re-sent to [facility] in [place].

## 2021-06-27 NOTE — Progress Notes (Signed)
Virtual Visit via Telephone Note  I connected with Tricia Newman on [id number] at  3:20 PM EDT by telephone and verified that I am speaking with the correct person using two identifiers.   I discussed the limitations, risks, security and privacy concerns of performing an evaluation and management service by telephone and the availability of in person appointments. I also discussed with the patient that there may be a patient responsible charge related to this service. The patient expressed understanding and agreed to proceed.  Location patient: home, [place] Location provider: work or home office Participants present for the call: patient, provider Patient did not have a visit with me in the prior 7 days to address this/these issue(s).   History of Present Illness:  Acute telemedicine visit for cough: -Onset: started about 8-10 days ago -Symptoms include: had a fever, flu like symptoms initially, now mainly having sinus issues and has worsened with thick green mucus and sinus discomfort and a cough -Denies:fevers now, CP, SOB, NVD, inability to eat/drink/get out of bed -Pertinent past medical history: reports a hx of sinus infections, denies any antibiotic use -Pertinent medication allergies: No Known Allergies -COVID-19 vaccine status: had 2 doses and 2 boosters    Past Medical History:  Diagnosis Date   Allergic rhinitis    CVA (cerebral vascular accident) ([place])    Hypertension    Migraine headache    Osteoporosis    PMS (premenstrual syndrome)    Rosacea    Observations/Objective: Patient sounds cheerful and well on the phone. I do not appreciate any SOB. Speech and thought processing are grossly intact. Patient reported vitals:  Assessment and Plan:  Nasal congestion  Facial discomfort  -we discussed possible serious and likely etiologies, options for evaluation and workup, limitations of telemedicine visit vs in person visit, treatment, treatment risks and precautions.  Pt prefers to treat via telemedicine empirically rather than in person at this moment. Query influenza, [phone] vs other with possible post viral symptoms or developing sinusitis. She has opted to try tessalon for cough and nasal saline with Rx for Augmentin if worsening or not improving with the other measures per her request. Advised to seek prompt in person care if worsening, new symptoms arise, or if is not improving with treatment. Advised of options for inperson care in case PCP office not available. Did let the patient know that I only do telemedicine shifts for [place] on Tuesdays and Thursdays and advised a follow up visit with PCP or at an [facility] if has further questions or concerns.   Follow Up Instructions:  I did not refer this patient for an OV with me in the next 24 hours for this/these issue(s).  I discussed the assessment and treatment plan with the patient. The patient was provided an opportunity to ask questions and all were answered. The patient agreed with the plan and demonstrated an understanding of the instructions.   I spent 13 minutes on the date of this visit in the care of this patient. See summary of tasks completed to properly care for this patient in the detailed notes above which also included counseling of above, review of PMH, medications, allergies, evaluation of the patient and ordering and/or  instructing patient on testing and care options.     Lucretia Kern, DO

## 2021-07-09 ENCOUNTER — Ambulatory Visit (HOSPITAL_COMMUNITY)
Admission: RE | Admit: 2021-07-09 | Discharge: 2021-07-09 | Disposition: A | Discharge status: Home / Self Care | Payer: Medicare Other | Source: Ambulatory Visit | Attending: Neurology | Admitting: Neurology

## 2021-07-09 ENCOUNTER — Other Ambulatory Visit: Payer: Self-pay

## 2021-07-09 DIAGNOSIS — I6930 Unspecified sequelae of cerebral infarction: Secondary | ICD-10-CM

## 2021-07-10 ENCOUNTER — Telehealth: Payer: Self-pay | Admitting: *Deleted

## 2021-07-10 NOTE — Telephone Encounter (Signed)
I spoke to patient. She verbalized understanding of results.

## 2021-07-10 NOTE — Telephone Encounter (Signed)
-----   Message from [name], MD sent at 07/10/2021  5:00 PM EDT ----- Mitchell Heir inform the patient that carotid ultrasound study did not show significant blockages of either carotid artery in the neck ----- Message ----- From: Interface, [employer] Sent: 07/09/2021   2:32 PM EDT To: [name], MD

## 2021-08-06 ENCOUNTER — Telehealth: Payer: Self-pay | Admitting: Family Medicine

## 2021-08-06 NOTE — Telephone Encounter (Signed)
Left message asking patient to call office Please r/s 8/24 appt laura will be out of office.  Please r/s for virtual or phone visit

## 2021-08-08 ENCOUNTER — Ambulatory Visit: Payer: Medicare Other

## 2021-08-12 ENCOUNTER — Ambulatory Visit: Payer: Medicare Other

## 2021-08-16 ENCOUNTER — Ambulatory Visit (INDEPENDENT_AMBULATORY_CARE_PROVIDER_SITE_OTHER): Payer: Medicare Other

## 2021-08-16 DIAGNOSIS — Z0000 Encounter for general adult medical examination without abnormal findings: Secondary | ICD-10-CM | POA: Diagnosis not present

## 2021-08-16 NOTE — Progress Notes (Signed)
Subjective:   Tricia Newman is a 80 y.o. female who presents for Medicare Annual (Subsequent) preventive examination.  I connected with Tricia Newman today by telephone and verified that I am speaking with the correct person using two identifiers. Location patient: home Location provider: work Persons participating in the virtual visit: patient, provider.   I discussed the limitations, risks, security and privacy concerns of performing an evaluation and management service by telephone and the availability of in person appointments. I also discussed with the patient that there may be a patient responsible charge related to this service. The patient expressed understanding and verbally consented to this telephonic visit.    Interactive audio and video telecommunications were attempted between this provider and patient, however failed, due to patient having technical difficulties OR patient did not have access to video capability.  We continued and completed visit with audio only.    Review of Systems     Cardiac Risk Factors include: advanced age (>31men, >82 women);hypertension     Objective:    Today's Vitals   There is no height or weight on file to calculate BMI.  Advanced Directives 08/16/2021 10/05/2020 12/22/2017  Does Patient Have a Medical Advance Directive? Yes No Yes  Type of Advance Directive Living will - Living will  Would patient like information on creating a medical advance directive? - No - Guardian declined -    Current Medications (verified) Outpatient Encounter Medications as of 08/16/2021  Medication Sig   acetaminophen (TYLENOL) 500 MG tablet Take 500 mg by mouth every 6 (six) hours as needed.   atorvastatin (LIPITOR) 40 MG tablet Take 1 tablet (40 mg total) by mouth daily.   diltiazem (CARDIZEM) 120 MG tablet Take 120 mg by mouth daily.   ELIQUIS 5 MG TABS tablet TAKE 1 TABLET BY MOUTH TWICE A DAY   loratadine (CLARITIN) 10 MG tablet Take 10 mg by mouth  daily.   Multiple Vitamins-Minerals (CENTRUM SILVER PO) Take 1 tablet by mouth daily.   tiZANidine (ZANAFLEX) 4 MG tablet Take 0.5-1 tablets (2-4 mg total) by mouth every 12 (twelve) hours as needed for muscle spasms. Please establish with a new provider for further refills   traMADol (ULTRAM) 50 MG tablet TAKE 1 TABLET BY MOUTH AT BEDTIME AS NEEDED FOR PAIN   amoxicillin-clavulanate (AUGMENTIN) 875-125 MG tablet Take 1 tablet by mouth 2 (two) times daily. (Patient not taking: Reported on 08/16/2021)   benzonatate (TESSALON PERLES) 100 MG capsule Take 1 capsule (100 mg total) by mouth 3 (three) times daily as needed. (Patient not taking: Reported on 08/16/2021)   Calcium Carbonate-Vitamin D (CALCIUM 600 + D PO) Take 1 tablet by mouth daily. (Patient not taking: Reported on 08/16/2021)   No facility-administered encounter medications on file as of 08/16/2021.    Allergies (verified) Patient has no known allergies.   History: Past Medical History:  Diagnosis Date   Allergic rhinitis    CVA (cerebral vascular accident) ([place])    Hypertension    Migraine headache    Osteoporosis    PMS (premenstrual syndrome)    Rosacea    Past Surgical History:  Procedure Laterality Date   childbirthx2     COLONOSCOPY  06/2003   Family History  Problem Relation Age of Onset   Colon polyps Sister    Colon polyps Brother    Breast cancer Maternal Aunt    CAD Mother    Colon cancer Neg Hx    Social History   Socioeconomic History  Marital status: Widowed    Spouse name: Not on file   Number of children: 2   Years of education: Not on file   Highest education level: Not on file  Occupational History   Not on file  Tobacco Use   Smoking status: Former    Types: Cigarettes    Quit date: 10/20/1976    Years since quitting: 44.8   Smokeless tobacco: Never  Substance and Sexual Activity   Alcohol use: No   Drug use: No   Sexual activity: Not on file  Other Topics Concern   Not on file   Social History Narrative   Lives alone   Right handed   Drinks no caffeine daily   Social Determinants of Health   Financial Resource Strain: Low Risk    Difficulty of Paying Living Expenses: Not hard at all  Food Insecurity: No Food Insecurity   Worried About Charity fundraiser in the Last Year: Never true   [place] in the Last Year: Never true  Transportation Needs: No Transportation Needs   Lack of Transportation (Medical): No   Lack of Transportation (Non-Medical): No  Physical Activity: Insufficiently Active   Days of Exercise per Week: 3 days   Minutes of Exercise per Session: 30 min  Stress: No Stress Concern Present   Feeling of Stress : Not at all  Social Connections: Moderately Isolated   Frequency of Communication with Friends and Family: Twice a week   Frequency of Social Gatherings with Friends and Family: Twice a week   Attends Religious Services: More than 4 times per year   Active Member of [employer] or Organizations: No   Attends Archivist Meetings: Never   Marital Status: Widowed    Tobacco Counseling Counseling given: Not Answered   Clinical Intake:  Pre-visit preparation completed: Yes  Pain : No/denies pain     Nutritional Risks: None Diabetes: No  How often do you need to have someone help you when you read instructions, pamphlets, or other written materials from your doctor or pharmacy?: 1 - Never What is the last grade level you completed in school?: High School  Diabetic?no  Interpreter Needed?: No  Information entered by :: L.Mikaylah Libbey,LPN   Activities of Daily Living In your present state of health, do you have any difficulty performing the following activities: 08/16/2021 10/06/2020  Hearing? N N  Vision? N N  Difficulty concentrating or making decisions? N N  Walking or climbing stairs? N N  Dressing or bathing? N N  Doing errands, shopping? N N  Preparing Food and eating ? N -  Using the Toilet? N -  In the  past six months, have you accidently leaked urine? N -  Do you have problems with loss of bowel control? N -  Managing your Medications? N -  Managing your Finances? N -  Housekeeping or managing your Housekeeping? N -  Some recent data might be hidden    Patient Care Team: [name], [name], MD as PCP - General (Family Medicine) Viona Gilmore, [facility] as Pharmacist (Pharmacist)  Indicate any recent Medical Services you may have received from other than [facility] providers in the past year (date may be approximate).     Assessment:   This is a routine wellness examination for Tricia Newman.  Hearing/Vision screen Vision Screening - Comments:: Annual ey exams wears glasses  Dietary issues and exercise activities discussed: Current Exercise Habits: Home exercise routine, Type of exercise: walking, Time (  Minutes): 30, Frequency (Times/Week): 2, Weekly Exercise (Minutes/Week): 60, Intensity: Mild, Exercise limited by: cardiac condition(s)   Goals Addressed             This Visit's Progress    Prevent falls   On track      Depression Screen PHQ 2/9 Scores 08/16/2021 08/16/2021 08/01/2020 08/01/2019 10/06/2017 09/22/2016 09/11/2015  PHQ - 2 Score 0 0 0 0 0 0 0    Fall Risk Fall Risk  08/16/2021 08/01/2020 08/01/2019 09/09/2018 10/06/2017  Falls in the past year? 0 0 0 1 Yes  Comment - - - [facility] Telephone Survey: data to providers prior to load -  Number falls in past yr: 0 - 0 1 1  Comment - - - [facility]i Telephone Survey Actual Response = 1 -  Injury with Fall? 0 - 0 0 No  Risk for fall due to : No Fall Risks - Orthopedic patient - Other (Comment)  Follow up Falls evaluation completed - Education provided - -    FALL RISK PREVENTION PERTAINING TO THE HOME:  Any stairs in or around the home? No  If so, are there any without handrails? No  Home free of loose throw rugs in walkways, pet beds, electrical cords, etc? Yes  Adequate lighting in your home to reduce risk of falls? Yes    ASSISTIVE DEVICES UTILIZED TO PREVENT FALLS:  Life alert? No  Use of a cane, walker or w/c? No  Grab bars in the bathroom? Yes  Shower chair or bench in shower? Yes  Elevated toilet seat or a handicapped toilet? No    Cognitive Function:    Normal cognitive status assessed by direct observation by this Nurse Health Advisor. No abnormalities found.      Immunizations Immunization History  Administered Date(s) Administered   Fluad Quad(high Dose 65+) 08/01/2019, 08/01/2020   Influenza Split 07/30/2011, 08/02/2012   Influenza Whole 10/20/2004, 07/21/2008, 07/31/2009, 06/27/2010   Influenza, High Dose Seasonal PF 08/04/2013, 07/24/2015, 07/22/2016, 08/03/2017, 08/02/2018   Influenza,inj,Quad PF,6+ Mos 08/09/2014   PFIZER(Purple Top)SARS-COV-2 Vaccination 01/12/2020, 02/06/2020, 08/18/2020   Pneumococcal Conjugate-13 08/09/2014   Pneumococcal Polysaccharide-23 10/20/2004, 08/04/2013   Td 12/19/1995, 06/18/2006   Tdap 09/22/2016   Zoster, Live 12/07/2007    TDAP status: Up to date  Flu Vaccine status: Up to date  Pneumococcal vaccine status: Up to date  Covid-19 vaccine status: Completed vaccines  Qualifies for Shingles Vaccine? Yes   Zostavax completed No   Shingrix Completed?: Yes  Screening Tests Health Maintenance  Topic Date Due   Zoster Vaccines- Shingrix (1 of 2) Never done   COVID-19 Vaccine (4 - Booster for Pfizer series) 10/13/2020   INFLUENZA VACCINE  05/20/2021   TETANUS/TDAP  09/22/2026   Pneumonia Vaccine 67+ Years old  Completed   DEXA SCAN  Completed   HPV VACCINES  Aged Out   COLONOSCOPY (Pts 45-68yrs Insurance coverage will need to be confirmed)  Discontinued    Health Maintenance  Health Maintenance Due  Topic Date Due   Zoster Vaccines- Shingrix (1 of 2) Never done   COVID-19 Vaccine (4 - Booster for Pfizer series) 10/13/2020   INFLUENZA VACCINE  05/20/2021    Colorectal cancer screening: No longer required.   Mammogram status: No  longer required due to age.  Bone Density status: Completed 04/24/2020. Results reflect: Bone density results: OSTEOPENIA. Repeat every 5 years.  Lung Cancer Screening: (Low Dose CT Chest recommended if Age 79-80 years, 30 pack-year currently smoking OR have quit w/in 15years.)  does not qualify.   Lung Cancer Screening Referral: n/a  Additional Screening:  Hepatitis C Screening: does not qualify; Completed n/a  Vision Screening: Recommended annual ophthalmology exams for early detection of glaucoma and other disorders of the eye. Is the patient up to date with their annual eye exam?  Yes  Who is the provider or what is the name of the office in which the patient attends annual eye exams? Dr.Beavis  If pt is not established with a provider, would they like to be referred to a provider to establish care? No .   Dental Screening: Recommended annual dental exams for proper oral hygiene  Community Resource Referral / Chronic Care Management: CRR required this visit?  No   CCM required this visit?  No      Plan:     I have personally reviewed and noted the following in the patient's chart:   Medical and social history Use of alcohol, tobacco or illicit drugs  Current medications and supplements including opioid prescriptions.  Functional ability and status Nutritional status Physical activity Advanced directives List of other physicians Hospitalizations, surgeries, and ER visits in previous 12 months Vitals Screenings to include cognitive, depression, and falls Referrals and appointments  In addition, I have reviewed and discussed with patient certain preventive protocols, quality metrics, and best practice recommendations. A written personalized care plan for preventive services as well as general preventive health recommendations were provided to patient.     Randel Pigg, LPN   [id number]   Nurse Notes: none

## 2021-08-16 NOTE — Patient Instructions (Signed)
Tricia Newman , Thank you for taking time to come for your Medicare Wellness Visit. I appreciate your ongoing commitment to your health goals. Please review the following plan we discussed and let me know if I can assist you in the future.   Screening recommendations/referrals: Colonoscopy: no longer required  Mammogram: no longer required  Bone Density: 04/24/2020 Recommended yearly ophthalmology/optometry visit for glaucoma screening and checkup Recommended yearly dental visit for hygiene and checkup  Vaccinations: Influenza vaccine: due in fall 2022  Pneumococcal vaccine: completed series  Tdap vaccine: 09/22/2016 Shingles vaccine: completed series per patient     Advanced directives: will provide copies   Conditions/risks identified: none   Next appointment: none    Preventive Care 71 Years and Older, Female Preventive care refers to lifestyle choices and visits with your health care provider that can promote health and wellness. What does preventive care include? A yearly physical exam. This is also called an annual well check. Dental exams once or twice a year. Routine eye exams. Ask your health care provider how often you should have your eyes checked. Personal lifestyle choices, including: Daily care of your teeth and gums. Regular physical activity. Eating a healthy diet. Avoiding tobacco and drug use. Limiting alcohol use. Practicing safe sex. Taking low-dose aspirin every day. Taking vitamin and mineral supplements as recommended by your health care provider. What happens during an annual well check? The services and screenings done by your health care provider during your annual well check will depend on your age, overall health, lifestyle risk factors, and family history of disease. Counseling  Your health care provider may ask you questions about your: Alcohol use. Tobacco use. Drug use. Emotional well-being. Home and relationship well-being. Sexual  activity. Eating habits. History of falls. Memory and ability to understand (cognition). Work and work Statistician. Reproductive health. Screening  You may have the following tests or measurements: Height, weight, and BMI. Blood pressure. Lipid and cholesterol levels. These may be checked every 5 years, or more frequently if you are over 23 years old. Skin check. Lung cancer screening. You may have this screening every year starting at age 60 if you have a 30-pack-year history of smoking and currently smoke or have quit within the past 15 years. Fecal occult blood test (FOBT) of the stool. You may have this test every year starting at age 26. Flexible sigmoidoscopy or colonoscopy. You may have a sigmoidoscopy every 5 years or a colonoscopy every 10 years starting at age 106. Hepatitis C blood test. Hepatitis B blood test. Sexually transmitted disease (STD) testing. Diabetes screening. This is done by checking your blood sugar (glucose) after you have not eaten for a while (fasting). You may have this done every 1-3 years. Bone density scan. This is done to screen for osteoporosis. You may have this done starting at age 28. Mammogram. This may be done every 1-2 years. Talk to your health care provider about how often you should have regular mammograms. Talk with your health care provider about your test results, treatment options, and if necessary, the need for more tests. Vaccines  Your health care provider may recommend certain vaccines, such as: Influenza vaccine. This is recommended every year. Tetanus, diphtheria, and acellular pertussis (Tdap, Td) vaccine. You may need a Td booster every 10 years. Zoster vaccine. You may need this after age 57. Pneumococcal 13-valent conjugate (PCV13) vaccine. One dose is recommended after age 68. Pneumococcal polysaccharide (PPSV23) vaccine. One dose is recommended after age 72. Talk to  your health care provider about which screenings and vaccines  you need and how often you need them. This information is not intended to replace advice given to you by your health care provider. Make sure you discuss any questions you have with your health care provider. Document Released: 11/02/2015 Document Revised: 06/25/2016 Document Reviewed: 08/07/2015 Elsevier Interactive Patient Education  2017 [place] Prevention in the Home Falls can cause injuries. They can happen to people of all ages. There are many things you can do to make your home safe and to help prevent falls. What can I do on the outside of my home? Regularly fix the edges of walkways and driveways and fix any cracks. Remove anything that might make you trip as you walk through a door, such as a raised step or threshold. Trim any bushes or trees on the path to your home. Use bright outdoor lighting. Clear any walking paths of anything that might make someone trip, such as rocks or tools. Regularly check to see if handrails are loose or broken. Make sure that both sides of any steps have handrails. Any raised decks and porches should have guardrails on the edges. Have any leaves, snow, or ice cleared regularly. Use sand or salt on walking paths during winter. Clean up any spills in your garage right away. This includes oil or grease spills. What can I do in the bathroom? Use night lights. Install grab bars by the toilet and in the tub and shower. Do not use towel bars as grab bars. Use non-skid mats or decals in the tub or shower. If you need to sit down in the shower, use a plastic, non-slip stool. Keep the floor dry. Clean up any water that spills on the floor as soon as it happens. Remove soap buildup in the tub or shower regularly. Attach bath mats securely with double-sided non-slip rug tape. Do not have throw rugs and other things on the floor that can make you trip. What can I do in the bedroom? Use night lights. Make sure that you have a light by your bed that  is easy to reach. Do not use any sheets or blankets that are too big for your bed. They should not hang down onto the floor. Have a firm chair that has side arms. You can use this for support while you get dressed. Do not have throw rugs and other things on the floor that can make you trip. What can I do in the kitchen? Clean up any spills right away. Avoid walking on wet floors. Keep items that you use a lot in easy-to-reach places. If you need to reach something above you, use a strong step stool that has a grab bar. Keep electrical cords out of the way. Do not use floor polish or wax that makes floors slippery. If you must use wax, use non-skid floor wax. Do not have throw rugs and other things on the floor that can make you trip. What can I do with my stairs? Do not leave any items on the stairs. Make sure that there are handrails on both sides of the stairs and use them. Fix handrails that are broken or loose. Make sure that handrails are as long as the stairways. Check any carpeting to make sure that it is firmly attached to the stairs. Fix any carpet that is loose or worn. Avoid having throw rugs at the top or bottom of the stairs. If you do have throw rugs, attach  them to the floor with carpet tape. Make sure that you have a light switch at the top of the stairs and the bottom of the stairs. If you do not have them, ask someone to add them for you. What else can I do to help prevent falls? Wear shoes that: Do not have high heels. Have rubber bottoms. Are comfortable and fit you well. Are closed at the toe. Do not wear sandals. If you use a stepladder: Make sure that it is fully opened. Do not climb a closed stepladder. Make sure that both sides of the stepladder are locked into place. Ask someone to hold it for you, if possible. Clearly mark and make sure that you can see: Any grab bars or handrails. First and last steps. Where the edge of each step is. Use tools that help you  move around (mobility aids) if they are needed. These include: Canes. Walkers. Scooters. Crutches. Turn on the lights when you go into a dark area. Replace any light bulbs as soon as they burn out. Set up your furniture so you have a clear path. Avoid moving your furniture around. If any of your floors are uneven, fix them. If there are any pets around you, be aware of where they are. Review your medicines with your doctor. Some medicines can make you feel dizzy. This can increase your chance of falling. Ask your doctor what other things that you can do to help prevent falls. This information is not intended to replace advice given to you by your health care provider. Make sure you discuss any questions you have with your health care provider. Document Released: 08/02/2009 Document Revised: 03/13/2016 Document Reviewed: 11/10/2014 Elsevier Interactive Patient Education  2017 [employer].

## 2021-08-22 ENCOUNTER — Telehealth: Payer: Self-pay | Admitting: Pharmacist

## 2021-08-22 NOTE — Chronic Care Management (AMB) (Signed)
Chronic Care Management Pharmacy Assistant   Name: Tricia Newman  MRN: [id number] DOB: 04-08-1941  Reason for Encounter: Disease State / Hypertension Assessment Call    Conditions to be addressed/monitored: HTN  Recent office visits:  08/16/21 Randel Pigg, LPN - Patient presented for Medicare Annual Wellness Exam. No medication changes.  06/27/21 Lucretia Kern, DO - Patient presented via Tele - health for nasal congestion and other concerns. Prescribed Amoxicillin-Pot Clavulanate 875-125 mg & Benzonatate 100 mg.  Recent consult visits:  07/09/21 [name], MD (Neurology) - Patient presented to [facility] for Carotid Ultrasound. No medication changes.  06/12/21 [name], MD (Neurology) - Patient presented for late effect of cerebrovascular accident and other concerns. Stopped Diltiazem HCL.  06/10/21 Crenshaw, Denice Bors, MD (Cardiology) - Patient presented for Paroxysmal atrial fibrillation. Prescribed Diltiazem HCL. Stopped Metoprolol Succinate.  Hospital visits:  None in previous 6 months  Medications: Outpatient Encounter Medications as of 08/22/2021  Medication Sig   acetaminophen (TYLENOL) 500 MG tablet Take 500 mg by mouth every 6 (six) hours as needed.   amoxicillin-clavulanate (AUGMENTIN) 875-125 MG tablet Take 1 tablet by mouth 2 (two) times daily. (Patient not taking: Reported on 08/16/2021)   atorvastatin (LIPITOR) 40 MG tablet Take 1 tablet (40 mg total) by mouth daily.   benzonatate (TESSALON PERLES) 100 MG capsule Take 1 capsule (100 mg total) by mouth 3 (three) times daily as needed. (Patient not taking: Reported on 08/16/2021)   Calcium Carbonate-Vitamin D (CALCIUM 600 + D PO) Take 1 tablet by mouth daily. (Patient not taking: Reported on 08/16/2021)   diltiazem (CARDIZEM) 120 MG tablet Take 120 mg by mouth daily.   ELIQUIS 5 MG TABS tablet TAKE 1 TABLET BY MOUTH TWICE A DAY   loratadine (CLARITIN) 10 MG tablet Take 10 mg by mouth  daily.   Multiple Vitamins-Minerals (CENTRUM SILVER PO) Take 1 tablet by mouth daily.   tiZANidine (ZANAFLEX) 4 MG tablet Take 0.5-1 tablets (2-4 mg total) by mouth every 12 (twelve) hours as needed for muscle spasms. Please establish with a new provider for further refills   traMADol (ULTRAM) 50 MG tablet TAKE 1 TABLET BY MOUTH AT BEDTIME AS NEEDED FOR PAIN   No facility-administered encounter medications on file as of 08/22/2021.  Reviewed chart prior to disease state call. Spoke with patient regarding BP  Recent Office Vitals: BP Readings from Last 3 Encounters:  06/12/21 135/77  06/10/21 118/68  05/03/21 120/70   Pulse Readings from Last 3 Encounters:  06/12/21 (!) 54  06/10/21 82  05/03/21 65    Wt Readings from Last 3 Encounters:  06/12/21 136 lb 12.8 oz (62.1 kg)  06/10/21 135 lb 9.6 oz (61.5 kg)  05/03/21 135 lb (61.2 kg)     Kidney Function Lab Results  Component Value Date/Time   CREATININE 0.68 05/03/2021 11:49 AM   CREATININE 0.63 11/02/2020 03:25 PM   CREATININE 0.58 10/05/2020 11:17 AM   CREATININE 0.57 (L) 08/01/2020 07:54 AM   GFR 81.73 05/03/2021 11:49 AM   GFRNONAA >60 10/05/2020 11:17 AM   GFRNONAA 88 08/01/2020 07:54 AM   GFRAA 101 08/01/2020 07:54 AM    BMP Latest Ref Rng & Units 05/03/2021 11/02/2020 10/05/2020  Glucose 70 - 99 mg/dL 77 82 99  BUN 6 - 23 mg/dL 22 17 15   Creatinine 0.40 - 1.20 mg/dL 0.68 0.63 0.58  BUN/Creat Ratio 6 - 22 (calc) - NOT APPLICABLE -  Sodium 983 - 145 mEq/L  139 140 138  Potassium 3.5 - 5.1 mEq/L 3.9 4.2 4.2  Chloride 96 - 112 mEq/L 103 105 105  CO2 19 - 32 mEq/L 28 27 22   Calcium 8.4 - 10.5 mg/dL 9.5 9.4 8.8(L)    Current antihypertensive regimen:  Metoprolol succinate 25 mg 1 tablet daily How often are you checking your Blood Pressure? 3-5x per week Current home BP readings: Patient reports average of 130/70 What recent interventions/DTPs have been made by any provider to improve Blood Pressure control since last  CPP Visit: Patient reports no changes to medications for blood pressure. Any recent hospitalizations or ED visits since last visit with CPP? No What diet changes have been made to improve Blood Pressure Control?  Patient reports she eats very little chicken and fish no red meats no fried foods What exercise is being done to improve your Blood Pressure Control?  Patient reports she is still very active doing her gardening and now yard work Media planner.  Adherence Review: Is the patient currently on ACE/ARB medication? No Does the patient have >5 day gap between last estimated fill dates? No  Notes: Patient reports she is currently in A-fib and has been for some time. She reports her eliquis is expensive and was notified by her insurance of a price increase happening the beginning of next year. She reports she has been having trouble wih her memory and her family thought she may have been missing some doses of her medication and  causing the a-fib she has since set several alarms to be sure she misses no doses and it has not been an issue. Patient reports she is due to follow up with her cardiologist in Dec to see if she has to proceed with the pacemaker. She reports she has days where she feels like she is dragging and while siting doing nothing her pulse is all over the place. She also has days where she is active activities that would raise her pulse and it seems to stay consistent. Advised her of er appointment with the Pharmacist and that she will get a phone call the day before to remind her she reported that was fine.  Care Gaps: CCM F/U call - Scheduled 1/23 at 9 am AWV - Scheduled 07/2021 Zoster Vaccine - Overdue COVID Booster #4(Pfizer) - Overdue Flu Vaccine - Overdue  Star Rating Drugs: Atorvastatin (Lipitor) 40 mg - Last filled 07/29/2021 90 DS at [facility] Pharmacist Assistant [phone]

## 2021-09-01 ENCOUNTER — Other Ambulatory Visit: Payer: Self-pay | Admitting: Family Medicine

## 2021-09-09 ENCOUNTER — Ambulatory Visit (INDEPENDENT_AMBULATORY_CARE_PROVIDER_SITE_OTHER): Payer: Medicare Other

## 2021-09-09 DIAGNOSIS — Z23 Encounter for immunization: Secondary | ICD-10-CM

## 2021-09-16 NOTE — Progress Notes (Addendum)
Per Jeni Salles Call to Patient to offer, coupon card for 30 DS of Eliquis, She reports she is interested, informed [place] in Card to pharmacy, they report she just picked up a 78 DS this month will apply it to her next fill, call to patient and made her aware. She expressed thanks.  [place] Clinical Pharmacist Assistant [phone]

## 2021-09-24 DIAGNOSIS — H18413 Arcus senilis, bilateral: Secondary | ICD-10-CM | POA: Diagnosis not present

## 2021-09-24 DIAGNOSIS — H04123 Dry eye syndrome of bilateral lacrimal glands: Secondary | ICD-10-CM | POA: Diagnosis not present

## 2021-09-24 DIAGNOSIS — Z961 Presence of intraocular lens: Secondary | ICD-10-CM | POA: Diagnosis not present

## 2021-09-24 DIAGNOSIS — H02831 Dermatochalasis of right upper eyelid: Secondary | ICD-10-CM | POA: Diagnosis not present

## 2021-09-24 NOTE — Progress Notes (Signed)
HPI:FU atrial fibrillation.  Patient admitted December 2021 with altered mental status.  MRI consistent with posterior circulation infarct.  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  Echocardiogram December 2021 showed normal LV function.  Patient noted to have paroxysmal atrial fibrillation during hospitalization and apixaban added.  Carotid Doppler September 2022 showed 1 to 39% bilateral stenosis.  Since last seen, she denies dyspnea, chest pain, palpitations or syncope.  No bleeding.  Current Outpatient Medications  Medication Sig Dispense Refill   acetaminophen (TYLENOL) 500 MG tablet Take 500 mg by mouth every 6 (six) hours as needed.     atorvastatin (LIPITOR) 40 MG tablet TAKE 1 TABLET BY MOUTH EVERY DAY 90 tablet 3   benzonatate (TESSALON PERLES) 100 MG capsule Take 1 capsule (100 mg total) by mouth 3 (three) times daily as needed. 20 capsule 0   diltiazem (CARDIZEM) 120 MG tablet Take 120 mg by mouth daily.     ELIQUIS 5 MG TABS tablet TAKE 1 TABLET BY MOUTH TWICE A DAY 180 tablet 3   loratadine (CLARITIN) 10 MG tablet Take 10 mg by mouth daily.     Multiple Vitamins-Minerals (CENTRUM SILVER PO) Take 1 tablet by mouth daily.     tiZANidine (ZANAFLEX) 4 MG tablet Take 0.5-1 tablets (2-4 mg total) by mouth every 12 (twelve) hours as needed for muscle spasms. Please establish with a new provider for further refills 60 tablet 1   traMADol (ULTRAM) 50 MG tablet TAKE 1 TABLET BY MOUTH AT BEDTIME AS NEEDED FOR PAIN 30 tablet 1   amoxicillin-clavulanate (AUGMENTIN) 875-125 MG tablet Take 1 tablet by mouth 2 (two) times daily. (Patient not taking: Reported on 08/16/2021) 20 tablet 0   Calcium Carbonate-Vitamin D (CALCIUM 600 + D PO) Take 1 tablet by mouth daily. (Patient not taking: Reported on 08/16/2021)     No current facility-administered medications for this visit.     Past Medical History:  Diagnosis Date   Allergic  rhinitis    CVA (cerebral vascular accident) ([place])    Hypertension    Migraine headache    Osteoporosis    PMS (premenstrual syndrome)    Rosacea     Past Surgical History:  Procedure Laterality Date   childbirthx2     COLONOSCOPY  06/2003    Social History   Socioeconomic History   Marital status: Widowed    Spouse name: Not on file   Number of children: 2   Years of education: Not on file   Highest education level: Not on file  Occupational History   Not on file  Tobacco Use   Smoking status: Former    Types: Cigarettes    Quit date: 10/20/1976    Years since quitting: 44.9   Smokeless tobacco: Never  Substance and Sexual Activity   Alcohol use: No   Drug use: No   Sexual activity: Not on file  Other Topics Concern   Not on file  Social History Narrative   Lives alone   Right handed   Drinks no caffeine daily   Social Determinants of Health   Financial Resource Strain: Low Risk    Difficulty of Paying Living Expenses: Not hard at all  Food Insecurity: No Food Insecurity   Worried About Charity fundraiser in the Last Year: Never true   [place] in the Last Year: Never true  Transportation Needs: No Transportation Needs  Lack of Transportation (Medical): No   Lack of Transportation (Non-Medical): No  Physical Activity: Insufficiently Active   Days of Exercise per Week: 3 days   Minutes of Exercise per Session: 30 min  Stress: No Stress Concern Present   Feeling of Stress : Not at all  Social Connections: Moderately Isolated   Frequency of Communication with Friends and Family: Twice a week   Frequency of Social Gatherings with Friends and Family: Twice a week   Attends Religious Services: More than 4 times per year   Active Member of [employer] or Organizations: No   Attends Archivist Meetings: Never   Marital Status: Widowed  Human resources officer Violence: Not At Risk   Fear of Current or Ex-Partner: No   Emotionally Abused: No   Physically  Abused: No   Sexually Abused: No    Family History  Problem Relation Age of Onset   Colon polyps Sister    Colon polyps Brother    Breast cancer Maternal Aunt    CAD Mother    Colon cancer Neg Hx     ROS: no fevers or chills, productive cough, hemoptysis, dysphasia, odynophagia, melena, hematochezia, dysuria, hematuria, rash, seizure activity, orthopnea, PND, pedal edema, claudication. Remaining systems are negative.  Physical Exam: Well-developed well-nourished in no acute distress.  Skin is warm and dry.  HEENT is normal.  Neck is supple.  Chest is clear to auscultation with normal expansion.  Cardiovascular exam is irregular Abdominal exam nontender or distended. No masses palpated. Extremities show no edema. neuro grossly intact  ECG-atrial fibrillation at a rate of 69, no ST changes.  Personally reviewed  A/P  1 persistent atrial fibrillation-patient remains in atrial fibrillation today.  We will continue Cardizem for rate control.  Continue apixaban.  We discussed the options of rate control versus rhythm control.  Given that she is asymptomatic I would favor rate control and anticoagulation and she is in agreement.  2 hypertension-blood pressure elevated; she will follow this at home and we will add additional medications as needed.   Kirk Ruths, MD

## 2021-10-01 ENCOUNTER — Encounter: Payer: Self-pay | Admitting: Cardiology

## 2021-10-01 ENCOUNTER — Ambulatory Visit (INDEPENDENT_AMBULATORY_CARE_PROVIDER_SITE_OTHER): Payer: Medicare Other | Admitting: Cardiology

## 2021-10-01 ENCOUNTER — Other Ambulatory Visit: Payer: Self-pay

## 2021-10-01 VITALS — BP 154/74 | HR 69 | Ht 62.0 in | Wt 136.8 lb

## 2021-10-01 DIAGNOSIS — I63213 Cerebral infarction due to unspecified occlusion or stenosis of bilateral vertebral arteries: Secondary | ICD-10-CM | POA: Diagnosis not present

## 2021-10-01 DIAGNOSIS — I10 Essential (primary) hypertension: Secondary | ICD-10-CM

## 2021-10-01 DIAGNOSIS — I480 Paroxysmal atrial fibrillation: Secondary | ICD-10-CM | POA: Diagnosis not present

## 2021-10-01 NOTE — Patient Instructions (Signed)
°  Follow-Up: At [facility], you and your health needs are our priority.  As part of our continuing mission to provide you with exceptional heart care, we have created designated Provider Care Teams.  These Care Teams include your primary Cardiologist (physician) and Advanced Practice Providers (APPs -  Physician Assistants and Nurse Practitioners) who all work together to provide you with the care you need, when you need it.  We recommend signing up for the patient portal called "MyChart".  Sign up information is provided on this After Visit Summary.  MyChart is used to connect with patients for Virtual Visits (Telemedicine).  Patients are able to view lab/test results, encounter notes, upcoming appointments, etc.  Non-urgent messages can be sent to your provider as well.   To learn more about what you can do with MyChart, go to [email or web address].    Your next appointment:   6 month(s)  The format for your next appointment:   In Person  Provider:   Kirk Ruths MD

## 2021-10-29 ENCOUNTER — Telehealth: Payer: Self-pay | Admitting: Pharmacist

## 2021-10-29 NOTE — Progress Notes (Signed)
° ° °  Chronic Care Management Pharmacy Assistant   Name: Tricia Newman  MRN: [id number] DOB: 1941/06/11  10/29/21 APPOINTMENT REMINDER   Patient  was reminded to have all medications, supplements and any blood glucose and blood pressure readings available for review with Jeni Salles, Pharm. D, for telephone visit on 11/01/21 at 9.    Care Gaps: Zoster Vaccine - Overdue COVID Booster - Overdue AWV-10/22 BP- 154/74 (10/01/21)  Star Rating Drug: Atorvastatin (Lipitor) 40 mg - Last filled 07/29/2021 90 DS at [facility]  Any gaps in medications fill history? None   Medications: Outpatient Encounter Medications as of 10/29/2021  Medication Sig   acetaminophen (TYLENOL) 500 MG tablet Take 500 mg by mouth every 6 (six) hours as needed.   amoxicillin-clavulanate (AUGMENTIN) 875-125 MG tablet Take 1 tablet by mouth 2 (two) times daily. (Patient not taking: Reported on 08/16/2021)   atorvastatin (LIPITOR) 40 MG tablet TAKE 1 TABLET BY MOUTH EVERY DAY   benzonatate (TESSALON PERLES) 100 MG capsule Take 1 capsule (100 mg total) by mouth 3 (three) times daily as needed.   Calcium Carbonate-Vitamin D (CALCIUM 600 + D PO) Take 1 tablet by mouth daily. (Patient not taking: Reported on 08/16/2021)   diltiazem (CARDIZEM) 120 MG tablet Take 120 mg by mouth daily.   ELIQUIS 5 MG TABS tablet TAKE 1 TABLET BY MOUTH TWICE A DAY   loratadine (CLARITIN) 10 MG tablet Take 10 mg by mouth daily.   Multiple Vitamins-Minerals (CENTRUM SILVER PO) Take 1 tablet by mouth daily.   tiZANidine (ZANAFLEX) 4 MG tablet Take 0.5-1 tablets (2-4 mg total) by mouth every 12 (twelve) hours as needed for muscle spasms. Please establish with a new provider for further refills   traMADol (ULTRAM) 50 MG tablet TAKE 1 TABLET BY MOUTH AT BEDTIME AS NEEDED FOR PAIN   No facility-administered encounter medications on file as of 10/29/2021.     [place] Clinical Pharmacist Assistant [phone]

## 2021-11-01 ENCOUNTER — Ambulatory Visit (INDEPENDENT_AMBULATORY_CARE_PROVIDER_SITE_OTHER): Payer: Medicare Other | Admitting: Pharmacist

## 2021-11-01 DIAGNOSIS — I10 Essential (primary) hypertension: Secondary | ICD-10-CM

## 2021-11-01 DIAGNOSIS — E785 Hyperlipidemia, unspecified: Secondary | ICD-10-CM

## 2021-11-01 NOTE — Progress Notes (Signed)
Chronic Care Management Pharmacy Note  0/35/5974 Name:  Tricia Newman MRN:  [id number] DOB:  06/14/1941  Summary: BP is at goal < 130/80 per office readings LDL at goal < 70  Recommendations/Changes made from today's visit: -Recommended bringing BP cuff to next office visit to ensure accuracy -Recommended weekly monitoring of BP at home -Recommended supplementing with calcium citrate 600 daily to get recommended calcium intake  Plan: Follow up BP assessment in 3 months   Subjective: Tricia Newman is an 81 y.o. year old female who is a primary patient of [name], Malka So, MD.  The CCM team was consulted for assistance with disease management and care coordination needs.    Engaged with patient by telephone for follow up visit in response to provider referral for pharmacy case management and/or care coordination services.   Consent to Services:  The patient was given information about Chronic Care Management services, agreed to services, and gave verbal consent prior to initiation of services.  Please see initial visit note for detailed documentation.   Patient Care Team: [name], [name], MD as PCP - General (Family Medicine) Viona Gilmore, [facility] as Pharmacist (Pharmacist)  Recent office visits: 08/16/21 Randel Pigg, LPN: Patient presented for AWV.  06/27/21 Colin Benton, DO: Patient presented for video visit for sore throat and cough. Prescribed benzonatate and Augmentin only if symptoms don't improve.  11/02/20 [name], MD: Patient presented for chronic conditions follow up. No medication changes. Plan to repeat FLP at next visit.  Recent consult visits: 10/01/21 Kirk Ruths, MD (cardiology): Patient presented for Afib/Aflutter follow up. Follow up in 6 months.  06/12/21 Antony Contras, MD (neurology): Patient presented for CVA follow up. No medication changes.  06/10/21 [name], MD (Cardiology) - Patient presented for Paroxysmal atrial fibrillation.  Prescribed Diltiazem HCL. Stopped Metoprolol Succinate.  Hospital visits: None in previous 6 months   Objective:  Lab Results  Component Value Date   CREATININE 0.68 05/03/2021   BUN 22 05/03/2021   GFR 81.73 05/03/2021   GFRNONAA >60 10/05/2020   GFRAA 101 08/01/2020   NA 139 05/03/2021   K 3.9 05/03/2021   CALCIUM 9.5 05/03/2021   CO2 28 05/03/2021   GLUCOSE 77 05/03/2021    Lab Results  Component Value Date/Time   HGBA1C 5.6 06/12/2021 01:29 PM   HGBA1C 4.9 10/05/2020 11:17 AM   GFR 81.73 05/03/2021 11:49 AM   GFR 92.72 08/01/2019 07:47 AM    Last diabetic Eye exam: No results found for: HMDIABEYEEXA  Last diabetic Foot exam: No results found for: HMDIABFOOTEX   Lab Results  Component Value Date   CHOL 111 06/12/2021   HDL 75 06/12/2021   LDLCALC 26 06/12/2021   LDLDIRECT 105.7 07/30/2011   TRIG 36 06/12/2021   CHOLHDL 1.5 06/12/2021    Hepatic Function Latest Ref Rng & Units 05/03/2021 10/05/2020 08/01/2020  Total Protein 6.0 - 8.3 g/dL 7.7 6.3(L) 7.1  Albumin 3.5 - 5.2 g/dL 4.5 3.7 -  AST 0 - 37 U/L 43(H) 33 24  ALT 0 - 35 U/L 43(H) 20 16  Alk Phosphatase 39 - 117 U/L 131(H) 67 -  Total Bilirubin 0.2 - 1.2 mg/dL 0.8 0.9 0.7  Bilirubin, Direct 0.0 - 0.3 mg/dL - - -    Lab Results  Component Value Date/Time   TSH 2.970 12/04/2020 09:32 AM   TSH 4.47 10/06/2017 09:11 AM    CBC Latest Ref Rng & Units 11/02/2020 10/07/2020 10/05/2020  WBC 3.8 -  10.8 Thousand/uL 4.3 6.9 4.2  Hemoglobin 11.7 - 15.5 g/dL 13.2 13.2 12.4  Hematocrit 35.0 - 45.0 % 40.2 39.9 38.1  Platelets 140 - 400 Thousand/uL 162 143(L) 100(L)    No results found for: VD25OH  Clinical ASCVD: Yes  The ASCVD Risk score (Arnett DK, et al., 2019) failed to calculate for the following reasons:   The 2019 ASCVD risk score is only valid for ages 73 to 34   The patient has a prior MI or stroke diagnosis    Depression screen [facility] 2/9 08/16/2021 08/16/2021 08/01/2020  Decreased Interest 0 0 0   Down, Depressed, Hopeless 0 0 0  PHQ - 2 Score 0 0 0     Social History   Tobacco Use  Smoking Status Former   Types: Cigarettes   Quit date: 10/20/1976   Years since quitting: 45.0  Smokeless Tobacco Never   BP Readings from Last 3 Encounters:  10/01/21 (!) 154/74  06/12/21 135/77  06/10/21 118/68   Pulse Readings from Last 3 Encounters:  10/01/21 69  06/12/21 (!) 54  06/10/21 82   Wt Readings from Last 3 Encounters:  10/01/21 136 lb 12.8 oz (62.1 kg)  06/12/21 136 lb 12.8 oz (62.1 kg)  06/10/21 135 lb 9.6 oz (61.5 kg)   BMI Readings from Last 3 Encounters:  10/01/21 25.02 kg/m  06/12/21 25.02 kg/m  06/10/21 25.62 kg/m    Assessment/Interventions: Review of patient past medical history, allergies, medications, health status, including review of consultants reports, laboratory and other test data, was performed as part of comprehensive evaluation and provision of chronic care management services.   SDOH:  (Social Determinants of Health) assessments and interventions performed: No   SDOH Screenings   Alcohol Screen: Low Risk    Last Alcohol Screening Score (AUDIT): 0  Depression (PHQ2-9): Low Risk    PHQ-2 Score: 0  Financial Resource Strain: Low Risk    Difficulty of Paying Living Expenses: Not hard at all  Food Insecurity: No Food Insecurity   Worried About Charity fundraiser in the Last Year: Never true   Ran Out of Food in the Last Year: Never true  Housing: Low Risk    Last Housing Risk Score: 0  Physical Activity: Insufficiently Active   Days of Exercise per Week: 3 days   Minutes of Exercise per Session: 30 min  Social Connections: Moderately Isolated   Frequency of Communication with Friends and Family: Twice a week   Frequency of Social Gatherings with Friends and Family: Twice a week   Attends Religious Services: More than 4 times per year   Active Member of [employer] or Organizations: No   Attends Archivist Meetings: Never   Marital  Status: Widowed  Stress: No Stress Concern Present   Feeling of Stress : Not at all  Tobacco Use: Medium Risk   Smoking Tobacco Use: Former   Smokeless Tobacco Use: Never   Passive Exposure: Not on file  Transportation Needs: No Transportation Needs   Lack of Transportation (Medical): No   Lack of Transportation (Non-Medical): No     CCM Care Plan  No Known Allergies  Medications Reviewed Today     Reviewed by Viona Gilmore, [facility] (Pharmacist) on 11/01/21 at [place] List Status: <None>   Medication Order Taking? Sig Documenting Provider Last Dose Status Informant  acetaminophen (TYLENOL) 500 MG tablet [id number]  Take 500 mg by mouth every 6 (six) hours as needed. [provider]  Active  atorvastatin (LIPITOR) 40 MG tablet [id number] Yes TAKE 1 TABLET BY MOUTH EVERY DAY [name], [name], MD Taking Active   Calcium Carbonate-Vitamin D (CALCIUM 600 + D PO) [id number] Yes Take 1 tablet by mouth daily. [provider] Taking Active Self  diltiazem (CARDIZEM) 120 MG tablet [id number] Yes Take 120 mg by mouth daily. [provider] Taking Active   ELIQUIS 5 MG TABS tablet [id number] Yes TAKE 1 TABLET BY MOUTH TWICE A DAY [name], [name], MD Taking Active   loratadine (CLARITIN) 10 MG tablet [id number] Yes Take 10 mg by mouth daily. [provider] Taking Active Self  Multiple Vitamins-Minerals (CENTRUM SILVER PO) [id number]  Take 1 tablet by mouth daily. [provider]  Active Self  tiZANidine (ZANAFLEX) 4 MG tablet [id number]  Take 0.5-1 tablets (2-4 mg total) by mouth every 12 (twelve) hours as needed for muscle spasms. Please establish with a new provider for further refills [name], [name], MD  Active Self  traMADol Veatrice Bourbon) 50 MG tablet [id number]  TAKE 1 TABLET BY MOUTH AT BEDTIME AS NEEDED FOR PAIN [name], [name], MD  Active Self            Patient Active Problem List   Diagnosis Date Noted   Thrombocytopenia ([place]) 11/02/2020   Atrial  flutter ([place]) 10/09/2020   CVA (cerebral vascular accident) ([place]) 10/05/2020   Chronic bilateral low back pain with bilateral sciatica 08/01/2019   Chronic pain disorder 10/06/2018   Hyperlipidemia 10/06/2018   Back pain, chronic 08/02/2012   SEBACEOUS GLAND DISORDER 07/08/2010   Essential hypertension 07/21/2008   ACNE ROSACEA 06/24/2007   Allergic rhinitis 05/26/2007   Osteopenia of hip 05/26/2007    Immunization History  Administered Date(s) Administered   Fluad Quad(high Dose 65+) 08/01/2019, 08/01/2020, 09/09/2021   Influenza Split 07/30/2011, 08/02/2012   Influenza Whole 10/20/2004, 07/21/2008, 07/31/2009, 06/27/2010   Influenza, High Dose Seasonal PF 08/04/2013, 07/24/2015, 07/22/2016, 08/03/2017, 08/02/2018   Influenza,inj,Quad PF,6+ Mos 08/09/2014   PFIZER(Purple Top)SARS-COV-2 Vaccination 01/12/2020, 02/06/2020, 08/18/2020   Pneumococcal Conjugate-13 08/09/2014   Pneumococcal Polysaccharide-23 10/20/2004, 08/04/2013   Td 12/19/1995, 06/18/2006   Tdap 09/22/2016   Zoster Recombinat (Shingrix) 06/10/2020, 09/12/2020   Zoster, Live 12/07/2007   Patient reports as time goes by she is getting better.  She is getting new flooring in her house and there is no where to check her blood pressure right now. They should be installing the flooring on Monday or Tuesday and will do it all in one day. She is looking forward to this being done.  Conditions to be addressed/monitored:  Hypertension, Hyperlipidemia, Atrial Fibrillation, Osteopenia, and Allergic Rhinitis  Conditions addressed this visit: Hypertension, hyperlipidemia, osteopenia  Care Plan : CCM Pharmacy Care Plan  Updates made by Viona Gilmore, [place] since 11/01/2021 12:00 AM     Problem: Problem: Hypertension, Hyperlipidemia, Atrial Fibrillation, Osteopenia, and Allergic Rhinitis      Long-Range Goal: Patient-Specific Goal   Start Date: 05/03/2021  Expected End Date: 05/03/2022  Recent Progress: On track   Priority: High  Note:   Current Barriers:  Unable to independently monitor therapeutic efficacy  Pharmacist Clinical Goal(s):  Patient will achieve adherence to monitoring guidelines and medication adherence to achieve therapeutic efficacy through collaboration with PharmD and provider.   Interventions: 1:1 collaboration with [name], [name], MD regarding development and update of comprehensive plan of care as evidenced by provider attestation and co-signature Inter-disciplinary care team collaboration (see longitudinal plan of care) Comprehensive medication review performed; medication list  updated in electronic medical record  Hypertension (BP goal <130/80) -Controlled -Current treatment: Diltiazem 120 mg 1 tablet daily - appropriate, effective, safe, accessible -Medications previously tried: metoprolol (fatigue), benazepril (switched to metoprolol post stroke) -Current home readings: usually 120-130s/70s (arm cuff - just bought a brand new one) -Current dietary habits: doesn't use much salt -Current exercise habits: not consistently -Denies hypotensive/hypertensive symptoms -Educated on BP goals and benefits of medications for prevention of heart attack, stroke and kidney damage; Importance of home blood pressure monitoring; Proper BP monitoring technique; -Counseled to monitor BP at home weekly, document, and provide log at future appointments -Counseled on diet and exercise extensively Recommended to continue current medication  Hyperlipidemia: (LDL goal < 70) -Controlled -Current treatment: Atorvastatin 40 mg 1 tablet daily - appropriate, effective, safe, accessible -Medications previously tried: fish oil  -Current dietary patterns: eating lots of fruits and vegetables; limiting fried foods -Current exercise habits: not consistently -Educated on Cholesterol goals;  Benefits of statin for ASCVD risk reduction; Importance of limiting foods high in cholesterol; Exercise  goal of 150 minutes per week; -Counseled on diet and exercise extensively Recommended to continue current medication  Atrial Fibrillation (Goal: prevent stroke and major bleeding) -Controlled -CHADSVASC: 6 -Current treatment: Rate control:  Diltiazem 120 mg 1 tablet daily - appropriate, effective, safe, accessible Anticoagulation: Eliquis 5 mg 1 tablet twice daily - appropriate, effective, safe, accessible -Medications previously tried: none -Home BP and HR readings: does not check at home  -Counseled on increased risk of stroke due to Afib and benefits of anticoagulation for stroke prevention; importance of adherence to anticoagulant exactly as prescribed; avoidance of NSAIDs due to increased bleeding risk with anticoagulants; -Counseled on diet and exercise extensively Recommended to continue current medication Assessed patient finances. Patient does not qualify for Eliquis patient assistance.  Osteopenia (Goal prevent fractures) -Controlled -Last DEXA Scan: 04/24/20   T-Score femoral neck: -1.8  T-Score total hip: n/a  T-Score lumbar spine: n/a  T-Score forearm radius: -1.6  10-year probability of major osteoporotic fracture: 12.4%  10-year probability of hip fracture: 2.7% -Patient is not a candidate for pharmacologic treatment -Current treatment  Calcium carbonate 600 mg 1 tablet daily Multivitamin 1 tablet daily (contains calcium 220 mg and 1000 units of vitamin D) -Medications previously tried: none  -Recommend [id number] units of vitamin D daily. Recommend 1200 mg of calcium daily from dietary and supplemental sources. Recommend weight-bearing and muscle strengthening exercises for building and maintaining bone density. -Counseled on diet and exercise extensively Recommended restarting calcium 600 mg daily and separate from multivitamin.  Pain/muscle spasms  (Goal: minimize pain) -Controlled -Current treatment  Tramadol 50 mg 1 tablet as needed for pain (taking twice a  month) - appropriate, effective, safe, accessible Tizanidine 4 mg 1/2-1 tablet every 12 hours as needed (once every 6 or 7 weeks) - appropriate, effective, safe, accessible -Medications previously tried: none  -Counseled on risk for sedation with taking both of these together.  Allergic rhinitis (Goal: minimize symptoms) -Controlled -Current treatment  Claritin 10 mg 1 tablet daily - appropriate, effective, safe, accessible -Medications previously tried: none  -Recommended to continue current medication Counseled on avoiding allergens.  Health Maintenance -Vaccine gaps: COVID booster -Current therapy:  Multivitamin 1 tablet daily -Educated on Cost vs benefit of each product must be carefully weighed by individual consumer -Patient is satisfied with current therapy and denies issues -Recommended to continue current medication  Patient Goals/Self-Care Activities Patient will:  - take medications as prescribed focus on medication adherence by  continued use of alarms and pillbox check blood pressure weekly, document, and provide at future appointments target a minimum of 150 minutes of moderate intensity exercise weekly  Follow Up Plan: Telephone follow up appointment with care management team member scheduled for: 6 months      Medication Assistance: None required.  Patient affirms current coverage meets needs.  Compliance/Adherence/Medication fill history: Care Gaps: COVID booster (both boosters - she [facility] at [facility]) BP- 154/74 (10/01/21)  Star-Rating Drugs: Atorvastatin (Lipitor) 40 mg - Last filled 07/29/2021 [facility] at [facility]  [facility]  [id number] [address]: [phone]: [address] [address]Phone: [phone]:  [phone]  Uses pill box? Yes Pt endorses 100% compliance  We discussed: Current pharmacy is preferred with insurance plan and patient is satisfied with pharmacy services Patient decided to: Continue current medication management strategy  Care Plan and Follow Up Patient Decision:  Patient agrees to Care Plan and Follow-up.  Plan: Telephone follow up appointment with care management team member scheduled for:  6 months  MJeni Salles PharmD, BSouthampton MeadowsPharmacist LStanhopeat [phone]

## 2021-11-11 ENCOUNTER — Ambulatory Visit: Payer: Medicare Other | Admitting: Family Medicine

## 2021-11-19 DIAGNOSIS — M8580 Other specified disorders of bone density and structure, unspecified site: Secondary | ICD-10-CM

## 2021-11-19 DIAGNOSIS — I10 Essential (primary) hypertension: Secondary | ICD-10-CM | POA: Diagnosis not present

## 2021-11-19 DIAGNOSIS — I4891 Unspecified atrial fibrillation: Secondary | ICD-10-CM | POA: Diagnosis not present

## 2021-11-19 DIAGNOSIS — E785 Hyperlipidemia, unspecified: Secondary | ICD-10-CM | POA: Diagnosis not present

## 2021-11-25 NOTE — Progress Notes (Signed)
HPI: [name] is a 81 y.o. female, who is here today for annual follow up.  She was last seen on 05/03/2021, since her last visit she has seen cardiologist to follow on paroxysmal atrial fibrillation, last visit on 10/01/2021. She has also seen neurologist. She is supposed to follow every 6 months.  Hyperlipidemia: Currently on Atorvastatin 40 mg daily. Following a low fat diet: Yes.. Side effects from medication:None.  Lab Results  Component Value Date   CHOL 111 06/12/2021   HDL 75 06/12/2021   LDLCALC 26 06/12/2021   LDLDIRECT 105.7 07/30/2011   TRIG 36 06/12/2021   CHOLHDL 1.5 06/12/2021   Hypertension:  Medications: Diltiazem 120 mg daily. BP readings at home:120-130's/60-70's,  Side effects:None. Negative for unusual or severe headache, visual changes, exertional chest pain, dyspnea,  focal weakness, or edema.  Atrial fib, she follows with cardiologist. She needs refills on Eliquis, taking 2.5 mg bid.  Lab Results  Component Value Date   CREATININE 0.68 05/03/2021   BUN 22 05/03/2021   NA 139 05/03/2021   K 3.9 05/03/2021   CL 103 05/03/2021   CO2 28 05/03/2021   Has had episodes of thrombocytopenia in the past, intermittent. She has not noted nose/gum bleeding,gross hematuria,blood in stool,or melena.  Lab Results  Component Value Date   WBC 4.3 11/02/2020   HGB 13.2 11/02/2020   HCT 40.2 11/02/2020   MCV 86.8 11/02/2020   PLT 162 11/02/2020   Chronic back pain: She takes Tramadol occasionally. Usually she has pain if she does yard work or some types of exercise. She has not taken Tramadol in a couple week. Alleviated by rest. Sometimes tingling sensation on pretibial area, stable for years.  Mildly abnormal LFT's. She has not noted abdominal pain,nausea,or jaundice.  Lab Results  Component Value Date   ALT 43 (H) 05/03/2021   AST 43 (H) 05/03/2021   ALKPHOS 131 (H) 05/03/2021   BILITOT 0.8 05/03/2021   Review of Systems   Constitutional:  Negative for activity change, appetite change, fatigue and fever.  HENT:  Negative for mouth sores, nosebleeds and sore throat.   Respiratory:  Negative for cough and wheezing.   Gastrointestinal:  Negative for vomiting.       Negative for changes in bowel habits.  Endocrine: Negative for cold intolerance and heat intolerance.  Genitourinary:  Negative for decreased urine volume, difficulty urinating, dysuria and hematuria.  Musculoskeletal:  Positive for back pain. Negative for gait problem.  Skin:  Negative for rash.  Neurological:  Negative for syncope, facial asymmetry and weakness.  Psychiatric/Behavioral:  Negative for confusion and sleep disturbance.   Rest see pertinent positives and negatives per HPI.  Current Outpatient Medications on File Prior to Visit  Medication Sig Dispense Refill   acetaminophen (TYLENOL) 500 MG tablet Take 500 mg by mouth every 6 (six) hours as needed.     atorvastatin (LIPITOR) 40 MG tablet TAKE 1 TABLET BY MOUTH EVERY DAY 90 tablet 3   Calcium Carbonate-Vitamin D (CALCIUM 600 + D PO) Take 1 tablet by mouth daily.     diltiazem (CARDIZEM CD) 120 MG 24 hr capsule Take 120 mg by mouth daily.     loratadine (CLARITIN) 10 MG tablet Take 10 mg by mouth daily.     Multiple Vitamins-Minerals (CENTRUM SILVER PO) Take 1 tablet by mouth daily.     tiZANidine (ZANAFLEX) 4 MG tablet Take 0.5-1 tablets (2-4 mg total) by mouth every 12 (twelve) hours as needed for muscle spasms.  Please establish with a new provider for further refills 60 tablet 1   traMADol (ULTRAM) 50 MG tablet TAKE 1 TABLET BY MOUTH AT BEDTIME AS NEEDED FOR PAIN 30 tablet 1   No current facility-administered medications on file prior to visit.   Past Medical History:  Diagnosis Date   Allergic rhinitis    CVA (cerebral vascular accident) ([place])    Hypertension    Migraine headache    Osteoporosis    PMS (premenstrual syndrome)    Rosacea    No Known Allergies  Social  History   Socioeconomic History   Marital status: Widowed    Spouse name: Not on file   Number of children: 2   Years of education: Not on file   Highest education level: Not on file  Occupational History   Not on file  Tobacco Use   Smoking status: Former    Types: Cigarettes    Quit date: 10/20/1976    Years since quitting: 45.1   Smokeless tobacco: Never  Substance and Sexual Activity   Alcohol use: No   Drug use: No   Sexual activity: Not on file  Other Topics Concern   Not on file  Social History Narrative   Lives alone   Right handed   Drinks no caffeine daily   Social Determinants of Health   Financial Resource Strain: Low Risk    Difficulty of Paying Living Expenses: Not hard at all  Food Insecurity: No Food Insecurity   Worried About Charity fundraiser in the Last Year: Never true   [place] in the Last Year: Never true  Transportation Needs: No Transportation Needs   Lack of Transportation (Medical): No   Lack of Transportation (Non-Medical): No  Physical Activity: Insufficiently Active   Days of Exercise per Week: 3 days   Minutes of Exercise per Session: 30 min  Stress: No Stress Concern Present   Feeling of Stress : Not at all  Social Connections: Moderately Isolated   Frequency of Communication with Friends and Family: Twice a week   Frequency of Social Gatherings with Friends and Family: Twice a week   Attends Religious Services: More than 4 times per year   Active Member of [employer] or Organizations: No   Attends Archivist Meetings: Never   Marital Status: Widowed   Vitals:   11/26/21 0809  BP: 138/80  Pulse: 95  Resp: 16  SpO2: 99%   Body mass index is 25.13 kg/m.  Physical Exam Vitals and nursing note reviewed.  Constitutional:      General: She is not in acute distress.    Appearance: She is well-developed.  HENT:     Head: Normocephalic and atraumatic.     Mouth/Throat:     Mouth: Mucous membranes are moist.      Pharynx: Oropharynx is clear.  Eyes:     Conjunctiva/sclera: Conjunctivae normal.  Cardiovascular:     Rate and Rhythm: Normal rate. Rhythm irregular.     Pulses:          Dorsalis pedis pulses are 2+ on the right side and 2+ on the left side.     Heart sounds: No murmur heard. Pulmonary:     Effort: Pulmonary effort is normal. No respiratory distress.     Breath sounds: Normal breath sounds.  Abdominal:     Palpations: Abdomen is soft. There is no hepatomegaly or mass.     Tenderness: There is no abdominal tenderness.  Musculoskeletal:     Lumbar back: No tenderness or bony tenderness.     Right lower leg: No edema.     Left lower leg: No edema.  Lymphadenopathy:     Cervical: No cervical adenopathy.  Skin:    General: Skin is warm.     Findings: No erythema or rash.  Neurological:     General: No focal deficit present.     Mental Status: She is alert and oriented to person, place, and time.     Cranial Nerves: No cranial nerve deficit.     Comments: Stable gait, not assisted.  Psychiatric:     Comments: Well groomed, good eye contact.   ASSESSMENT AND PLAN:  [name] was seen today for follow-up.  Diagnoses and all orders for this visit: Orders Placed This Encounter  Procedures   CBC   Basic metabolic panel   Hepatic function panel   Vitamin B12   Lab Results  Component Value Date   WBC 4.3 11/26/2021   HGB 12.8 11/26/2021   HCT 39.6 11/26/2021   MCV 83.9 11/26/2021   PLT 134.0 (L) 11/26/2021   Lab Results  Component Value Date   VITAMINB12 778 11/26/2021   Lab Results  Component Value Date   ALT 30 11/26/2021   AST 34 11/26/2021   ALKPHOS 108 11/26/2021   BILITOT 1.0 11/26/2021   Lab Results  Component Value Date   CREATININE 0.55 11/26/2021   BUN 17 11/26/2021   NA 139 11/26/2021   K 4.6 11/26/2021   CL 105 11/26/2021   CO2 32 11/26/2021   Elevated transaminase level Mild. Further recommendations according to lab  results.  Thrombocytopenia ([place]) 100-143, last one 162. if this time is normal we can continue following as needed. Instructed about warning signs.   Hyperlipidemia Last LDL 26. We discussed CV benefits of statin meds as well as side effects. She would like to try lower dose if possible. If LFT's still abnormal, I think it will be appropriate trying to decrease Atorvastatin dose from 40 mg to 20 mg. Continue low fat diet.  Essential hypertension BP adequately controlled, home BP readings better than what we got here today. Continue low salt diet and Diltiazem 120 mg daily. Continue monitoring BP regularly.  Chronic bilateral low back pain with bilateral sciatica Problem has been stable. She does not take Tramadol often and usually 30 tabs last more than a year. [place] controlled sub report reviewed. Tramadol 50 mg daily at bedtime to continue prn as well as Zanaflex 2-4 mg daily prn. Side effects discussed. Fall precautions. Recommend trying to avoid activities that exacerbated pain. Continue annual follow up as far as she does not need more than once Tramadol refill and before of pain gets worse.  Paroxysmal atrial fibrillation (HCC) Not rhythm controlled today. Eliquis 5 mg bid and Diltiazem 120 mg daily to continue. Following with cardiologist q 6 months.  Paresthesia of both lower extremities Chronic and reported as stable. Could be related to her chronic lower back pain. Appropriate skin care and trauma prevention.   Atherosclerosis of aorta (HCC) Continue Atorvastatin 40 mg daily. On chronic anticoagulation with Eliquis.  I spent a total of 41 minutes in both face to face and non face to face activities for this visit on the date of this encounter. During this time history was obtained and documented, examination was performed, prior labs/imaging reviewed, and assessment/plan discussed.  Return in about 1 year (around 11/26/2022) for CPE and f/u.  Inez Catalina  G. [name],  MD  [facility]. [place] office.

## 2021-11-26 ENCOUNTER — Ambulatory Visit (INDEPENDENT_AMBULATORY_CARE_PROVIDER_SITE_OTHER): Payer: Medicare Other | Admitting: Family Medicine

## 2021-11-26 ENCOUNTER — Encounter: Payer: Self-pay | Admitting: Family Medicine

## 2021-11-26 VITALS — BP 138/80 | HR 95 | Resp 16 | Ht 62.0 in | Wt 137.4 lb

## 2021-11-26 DIAGNOSIS — G8929 Other chronic pain: Secondary | ICD-10-CM

## 2021-11-26 DIAGNOSIS — E785 Hyperlipidemia, unspecified: Secondary | ICD-10-CM | POA: Diagnosis not present

## 2021-11-26 DIAGNOSIS — R7401 Elevation of levels of liver transaminase levels: Secondary | ICD-10-CM

## 2021-11-26 DIAGNOSIS — M5442 Lumbago with sciatica, left side: Secondary | ICD-10-CM | POA: Diagnosis not present

## 2021-11-26 DIAGNOSIS — R202 Paresthesia of skin: Secondary | ICD-10-CM | POA: Diagnosis not present

## 2021-11-26 DIAGNOSIS — I700 Atherosclerosis of aorta: Secondary | ICD-10-CM | POA: Diagnosis not present

## 2021-11-26 DIAGNOSIS — I10 Essential (primary) hypertension: Secondary | ICD-10-CM | POA: Diagnosis not present

## 2021-11-26 DIAGNOSIS — D696 Thrombocytopenia, unspecified: Secondary | ICD-10-CM | POA: Diagnosis not present

## 2021-11-26 DIAGNOSIS — I480 Paroxysmal atrial fibrillation: Secondary | ICD-10-CM | POA: Diagnosis not present

## 2021-11-26 DIAGNOSIS — M5441 Lumbago with sciatica, right side: Secondary | ICD-10-CM | POA: Diagnosis not present

## 2021-11-26 LAB — BASIC METABOLIC PANEL
BUN: 17 mg/dL (ref 6–23)
CO2: 32 mEq/L (ref 19–32)
Calcium: 9.7 mg/dL (ref 8.4–10.5)
Chloride: 105 mEq/L (ref 96–112)
Creatinine, Ser: 0.55 mg/dL (ref 0.40–1.20)
GFR: 85.67 mL/min (ref >60.00)
Glucose, Bld: 90 mg/dL (ref 70–99)
Potassium: 4.6 mEq/L (ref 3.5–5.1)
Sodium: 139 mEq/L (ref 135–145)

## 2021-11-26 LAB — CBC
HCT: 39.6 % (ref 36.0–46.0)
Hemoglobin: 12.8 g/dL (ref 12.0–15.0)
MCHC: 32.4 g/dL (ref 30.0–36.0)
MCV: 83.9 fl (ref 78.0–100.0)
Platelets: 134 10*3/uL — ABNORMAL LOW (ref 150.0–400.0)
RBC: 4.72 Mil/uL (ref 3.87–5.11)
RDW: 14.9 % (ref 11.5–15.5)
WBC: 4.3 10*3/uL (ref 4.0–10.5)

## 2021-11-26 LAB — HEPATIC FUNCTION PANEL
ALT: 30 U/L (ref 0–35)
AST: 34 U/L (ref 0–37)
Albumin: 4.2 g/dL (ref 3.5–5.2)
Alkaline Phosphatase: 108 U/L (ref 39–117)
Bilirubin, Direct: 0.2 mg/dL (ref 0.0–0.3)
Total Bilirubin: 1 mg/dL (ref 0.2–1.2)
Total Protein: 7.5 g/dL (ref 6.0–8.3)

## 2021-11-26 LAB — VITAMIN B12: Vitamin B-12: 778 pg/mL (ref 211–911)

## 2021-11-26 MED ORDER — APIXABAN 5 MG PO TABS: 5.0000 mg | ORAL_TABLET | Freq: Two times a day (BID) | ORAL | 3 refills | Status: DC

## 2021-11-26 NOTE — Assessment & Plan Note (Signed)
Last LDL 26. We discussed CV benefits of statin meds as well as side effects. She would like to try lower dose if possible. If LFT's still abnormal, I think it will be appropriate trying to decrease Atorvastatin dose from 40 mg to 20 mg. Continue low fat diet.

## 2021-11-26 NOTE — Assessment & Plan Note (Addendum)
Problem has been stable. She does not take Tramadol often and usually 30 tabs last more than a year. [place] controlled sub report reviewed. Tramadol 50 mg daily at bedtime to continue prn as well as Zanaflex 2-4 mg daily prn. Side effects discussed. Fall precautions. Recommend trying to avoid activities that exacerbated pain. Continue annual follow up as far as she does not need more than once Tramadol refill and before of pain gets worse.

## 2021-11-26 NOTE — Assessment & Plan Note (Signed)
BP adequately controlled, home BP readings better than what we got here today. Continue low salt diet and Diltiazem 120 mg daily. Continue monitoring BP regularly.

## 2021-11-26 NOTE — Assessment & Plan Note (Signed)
Not rhythm controlled today. Eliquis 5 mg bid and Diltiazem 120 mg daily to continue. Following with cardiologist q 6 months.

## 2021-11-26 NOTE — Assessment & Plan Note (Signed)
Chronic and reported as stable. Could be related to her chronic lower back pain. Appropriate skin care and trauma prevention.

## 2021-11-26 NOTE — Assessment & Plan Note (Addendum)
Platelets have been 100-143, last one normal at 162. if this time is normal we can continue following as needed. Instructed about warning signs.

## 2021-11-26 NOTE — Patient Instructions (Addendum)
A few things to remember from today's visit:  Essential hypertension - Plan: Basic metabolic panel  Hyperlipidemia, unspecified hyperlipidemia type  Paroxysmal atrial fibrillation ([place]), Chronic  Thrombocytopenia ([place]), Chronic - Plan: CBC  Elevated transaminase level - Plan: Hepatic function panel  Chronic bilateral low back pain with bilateral sciatica  Paresthesia of both lower extremities - Plan: Vitamin B12  If you need refills please call your pharmacy. Do not use My Chart to request refills or for acute issues that need immediate attention.   Please be sure medication list is accurate. If a new problem present, please set up appointment sooner than planned today.  No changes in Diltiazem or eliquis. No changes in Tramadol or zanaflex. We could decrease the dose of Atorvastatin if liver test still abnormal and recheck cholesterol in a couple months. I will let you know.  As far as you do not need more Tramadol and you are following with cardio and neurologist every 6 months I think it is appropriate to see you annually.

## 2021-11-26 NOTE — Assessment & Plan Note (Signed)
Continue Atorvastatin 40 mg daily. On chronic anticoagulation with Eliquis.

## 2021-11-30 MED ORDER — TRAMADOL HCL 50 MG PO TABS: ORAL_TABLET | ORAL | 0 refills | Status: DC

## 2021-11-30 NOTE — Addendum Note (Signed)
Addended by: [name], Eisley Barber G on: 11/30/2021 05:22 PM   Modules accepted: Orders

## 2021-12-05 ENCOUNTER — Ambulatory Visit: Payer: Medicare Other | Admitting: Adult Health

## 2021-12-12 ENCOUNTER — Ambulatory Visit: Payer: Medicare Other | Admitting: Adult Health

## 2022-01-23 ENCOUNTER — Telehealth: Payer: Self-pay | Admitting: Pharmacist

## 2022-01-23 NOTE — Chronic Care Management (AMB) (Signed)
? ? ?Chronic Care Management ?Pharmacy Assistant  ? ?Name: Tricia Newman  MRN: [id number] DOB: 1941-01-14 ? ?Reason for Encounter: Disease State Hypertension Assessment ?  ?Conditions to be addressed/monitored: ?HTN ? ?Recent office visits:  ?11/26/21 [name], [name], MD - Patient presented for Essential Hypertension and other concerns. Stopped Diltiazem. ? ?Recent consult visits:  ?None ? ?Hospital visits:  ?None in previous 6 months ? ?Medications: ?Outpatient Encounter Medications as of 01/23/2022  ?Medication Sig  ? acetaminophen (TYLENOL) 500 MG tablet Take 500 mg by mouth every 6 (six) hours as needed.  ? apixaban (ELIQUIS) 5 MG TABS tablet Take 1 tablet (5 mg total) by mouth 2 (two) times daily.  ? atorvastatin (LIPITOR) 40 MG tablet TAKE 1 TABLET BY MOUTH EVERY DAY  ? Calcium Carbonate-Vitamin D (CALCIUM 600 + D PO) Take 1 tablet by mouth daily.  ? diltiazem (CARDIZEM CD) 120 MG 24 hr capsule Take 120 mg by mouth daily.  ? loratadine (CLARITIN) 10 MG tablet Take 10 mg by mouth daily.  ? Multiple Vitamins-Minerals (CENTRUM SILVER PO) Take 1 tablet by mouth daily.  ? tiZANidine (ZANAFLEX) 4 MG tablet Take 0.5-1 tablets (2-4 mg total) by mouth every 12 (twelve) hours as needed for muscle spasms. Please establish with a new provider for further refills  ? traMADol (ULTRAM) 50 MG tablet TAKE 1 TABLET BY MOUTH AT BEDTIME AS NEEDED FOR PAIN  ? ?No facility-administered encounter medications on file as of 01/23/2022.  ?Reviewed chart prior to disease state call. Spoke with patient regarding BP ? ?Recent Office Vitals: ?BP Readings from Last 3 Encounters:  ?11/26/21 138/80  ?10/01/21 (!) 154/74  ?06/12/21 135/77  ? ?Pulse Readings from Last 3 Encounters:  ?11/26/21 95  ?10/01/21 69  ?06/12/21 (!) 54  ?  ?Wt Readings from Last 3 Encounters:  ?11/26/21 137 lb 6 oz (62.3 kg)  ?10/01/21 136 lb 12.8 oz (62.1 kg)  ?06/12/21 136 lb 12.8 oz (62.1 kg)  ?  ? ?Kidney Function ?Lab Results  ?Component Value Date/Time  ? CREATININE  0.55 11/26/2021 08:47 AM  ? CREATININE 0.68 05/03/2021 11:49 AM  ? CREATININE 0.63 11/02/2020 03:25 PM  ? CREATININE 0.57 (L) 08/01/2020 07:54 AM  ? GFR 85.67 11/26/2021 08:47 AM  ? GFRNONAA >60 10/05/2020 11:17 AM  ? GFRNONAA 88 08/01/2020 07:54 AM  ? GFRAA 101 08/01/2020 07:54 AM  ? ? ? ?  Latest Ref Rng & Units 11/26/2021  ?  8:47 AM 05/03/2021  ? 11:49 AM 11/02/2020  ?  3:25 PM  ?BMP  ?Glucose 70 - 99 mg/dL 90   77   82    ?BUN 6 - 23 mg/dL '17   22   17    '$ ?Creatinine 0.40 - 1.20 mg/dL 0.55   0.68   0.63    ?BUN/Creat Ratio 6 - 22 (calc)   NOT APPLICABLE    ?Sodium 135 - 145 mEq/L 139   139   140    ?Potassium 3.5 - 5.1 mEq/L 4.6   3.9   4.2    ?Chloride 96 - 112 mEq/L 105   103   105    ?CO2 19 - 32 mEq/L 32   28   27    ?Calcium 8.4 - 10.5 mg/dL 9.7   9.5   9.4    ? ? ?Current antihypertensive regimen:  ?Diltiazem 120 mg 1 tablet daily - appropriate, effective, safe, accessible ?How often are you checking your Blood Pressure? 1-2x per week ?Current home  BP readings: 120/70 patient reports never over 135 ?What recent interventions/DTPs have been made by any provider to improve Blood Pressure control since last CPP Visit: Patient reports ?Any recent hospitalizations or ED visits since last visit with CPP? None ?What diet changes have been made to improve Blood Pressure Control?  ?Patient reports she lives alone is doing her cooking ?What exercise is being done to improve your Blood Pressure Control?  ?Patient reports since the garden has warmed up she has been out in the yard preparing her garden for this season. ?Notes: ?Patient reports she thinks she was depressed but has since come out of it has been getting out of the house getting fresh air and working in the garden. She reports better outlook and mood. She reports she is looking forward to hosting her family for dinner this weekend and will even be preparing the meal. She reports no concerns or issues at this time. ? ?Adherence Review: ?Is the patient  currently on ACE/ARB medication? No ?Does the patient have >5 day gap between last estimated fill dates? No ? ? ? ?Care Gaps: ?COVID Booster - Overdue ?BP- 120/70 (01/23/22) ?AWV- 10/22 ?CCM - 7/23 ? ?Star Rating Drugs: ?Atorvastatin (Lipitor) 40 mg - Last filled 01/21/22 90 DS at [facility] ? ? ?Ned Clines CMA ?Clinical Pharmacist Assistant ?[phone] ? ?

## 2022-01-29 ENCOUNTER — Encounter: Payer: Self-pay | Admitting: Adult Health

## 2022-01-29 ENCOUNTER — Ambulatory Visit: Payer: Medicare Other | Admitting: Adult Health

## 2022-01-29 NOTE — Progress Notes (Deleted)
?[facility] ?[address] ?[place]. [place] [address] ?[phone] ? ?     OFFICE FOLLOW UP VISIT NOTE ? ?[name] ?Date of Birth:  Feb 05, 1941 ?Medical Record Number:  [id number]  ? ?Referring MD:  Berle Mull ? ?Primary neurologist: Dr. Leonie Newman ?Reason for Referral:  stroke ? ?No chief complaint on file. ?  ? ? ?HPI:  ? ?Update 01/29/2022 JM: Patient returns for stroke follow-up visit after prior visit 8 months ago.  Stable from stroke standpoint without new or reoccurring stroke/TIA symptoms.  Remains on Eliquis and atorvastatin, denies side effects.  Blood pressure today ***.  Closely followed by PCP Dr. [name] and cardiologist Dr. Stanford Newman. ? ? ? ? ? ?History provided for reference purposes only ?Update 8/242022 Dr. Leonie Newman: She returns for follow-up after last visit 6 months ago.  She states she is doing well.  She is in no recurrent stroke or TIA symptoms.  She is back in paroxysmal A. fib and recently saw cardiologist who plans to restart Cardizem and she was complaining of fatigue and tiredness on beta-blockers.  She remains on Eliquis which she is tolerating well without bruising or bleeding.  She remains on Lipitor which is tolerating well without muscle aches and pains.  She has no new complaints.  She has not had any follow-up lipid profile checked her carotid ultrasound done since admission for stroke in December 2021 ? ?Initial visit 12/12/2020 Dr. Phillips Newman. Tricia Newman is a pleasant 81 year old Caucasian lady seen today for initial office consultation visit for stroke.  History is obtained from the patient and her son Tricia Newman as well as review of electronic medical records and I personally reviewed pertinent imaging films in PACS.  Patient has past medical history significant for hypertension, hematemesis, migraine headaches who presented to [facility] emergency room via EMS on 10/05/2020 for altered mental status.  She was last seen by [place] in a few days ago and was fine.  Neighbors  called EMS as they found patient in a driver to be confused and perseverating and having speech difficulties and disoriented.  CT scan of the head was unremarkable and MRI scan of the brain was obtained which showed bithalamic infarcts left greater than right as well as involving the right hippocampus.  CT angiogram showed severe right PCA and moderate left P2 stenosis.  CT angio of the neck showed no significant extracranial stenosis.  2D echo showed ejection fraction 6065%.  LDL cholesterol 99 mg percent and hemoglobin A1c was 4.9.  Patient was found to be new onset of a flutter and started on anticoagulation with Eliquis.  Patient did quite well and the confusion disorientation and speech improved significantly back to baseline quickly.  She was also started on beta-blocker.  Patient is back to her baseline to living at home she is independent she has no residual deficits from the stroke.  She is tolerating Eliquis well without bleeding with only minor bruising.  She is following up with a cardiologist.  Her blood pressure is well controlled and today it is 124/77.  She is tolerating Lipitor well without muscle aches and pains. ? ? ? ?ROS:   ?14 system review of systems is positive for  Confusion, disorientation, speech difficulty and all other systems negative. ?PMH:  ?Past Medical History:  ?Diagnosis Date  ? Allergic rhinitis   ? CVA (cerebral vascular accident) [facility])   ? Hypertension   ? Migraine headache   ? Osteoporosis   ? PMS (premenstrual syndrome)   ?  Rosacea   ? ? ?Social History:  ?Social History  ? ?Socioeconomic History  ? Marital status: Widowed  ?  Spouse name: Not on file  ? Number of children: 2  ? Years of education: Not on file  ? Highest education level: Not on file  ?Occupational History  ? Not on file  ?Tobacco Use  ? Smoking status: Former  ?  Types: Cigarettes  ?  Quit date: 10/20/1976  ?  Years since quitting: 45.3  ? Smokeless tobacco: Never  ?Substance and Sexual Activity  ? Alcohol use:  No  ? Drug use: No  ? Sexual activity: Not on file  ?Other Topics Concern  ? Not on file  ?Social History Narrative  ? Lives alone  ? Right handed  ? Drinks no caffeine daily  ? ?Social Determinants of Health  ? ?Financial Resource Strain: Low Risk   ? Difficulty of Paying Living Expenses: Not hard at all  ?Food Insecurity: No Food Insecurity  ? Worried About Charity fundraiser in the Last Year: Never true  ? Ran Out of Food in the Last Year: Never true  ?Transportation Needs: No Transportation Needs  ? Lack of Transportation (Medical): No  ? Lack of Transportation (Non-Medical): No  ?Physical Activity: Insufficiently Active  ? Days of Exercise per Week: 3 days  ? Minutes of Exercise per Session: 30 min  ?Stress: No Stress Concern Present  ? Feeling of Stress : Not at all  ?Social Connections: Moderately Isolated  ? Frequency of Communication with Friends and Family: Twice a week  ? Frequency of Social Gatherings with Friends and Family: Twice a week  ? Attends Religious Services: More than 4 times per year  ? Active Member of Clubs or Organizations: No  ? Attends Archivist Meetings: Never  ? Marital Status: Widowed  ?Intimate Partner Violence: Not At Risk  ? Fear of Current or Ex-Partner: No  ? Emotionally Abused: No  ? Physically Abused: No  ? Sexually Abused: No  ? ? ?Medications:   ?Current Outpatient Medications on File Prior to Visit  ?Medication Sig Dispense Refill  ? acetaminophen (TYLENOL) 500 MG tablet Take 500 mg by mouth every 6 (six) hours as needed.    ? apixaban (ELIQUIS) 5 MG TABS tablet Take 1 tablet (5 mg total) by mouth 2 (two) times daily. 180 tablet 3  ? atorvastatin (LIPITOR) 40 MG tablet TAKE 1 TABLET BY MOUTH EVERY DAY 90 tablet 3  ? Calcium Carbonate-Vitamin D (CALCIUM 600 + D PO) Take 1 tablet by mouth daily.    ? diltiazem (CARDIZEM CD) 120 MG 24 hr capsule Take 120 mg by mouth daily.    ? loratadine (CLARITIN) 10 MG tablet Take 10 mg by mouth daily.    ? Multiple  Vitamins-Minerals (CENTRUM SILVER PO) Take 1 tablet by mouth daily.    ? tiZANidine (ZANAFLEX) 4 MG tablet Take 0.5-1 tablets (2-4 mg total) by mouth every 12 (twelve) hours as needed for muscle spasms. Please establish with a new provider for further refills 60 tablet 1  ? traMADol (ULTRAM) 50 MG tablet TAKE 1 TABLET BY MOUTH AT BEDTIME AS NEEDED FOR PAIN 30 tablet 0  ? ?No current facility-administered medications on file prior to visit.  ? ? ?Allergies:  No Known Allergies ? ?Physical Exam ?There were no vitals filed for this visit. ?There is no height or weight on file to calculate BMI.  ? ?General:pleasant elderly caucasian , seated, in no evident  distress ?Head: head normocephalic and atraumatic.   ?Neck: supple with no carotid or supraclavicular bruits ?Cardiovascular: regular rate and rhythm, no murmurs ?Musculoskeletal: no deformity ?Skin:  no rash/petichiae ?Vascular:  Normal pulses all extremities ? ?Neurologic Exam ?Mental Status: Awake and fully alert. Oriented to place and time. Recent and remote memory intact. Attention span, concentration and fund of knowledge appropriate. Mood and affect appropriate.  ?Cranial Nerves: Pupils equal, briskly reactive to light. Extraocular movements full without nystagmus. Visual fields full to confrontation. Hearing intact. Facial sensation intact. Face, tongue, palate moves normally and symmetrically.  ?Motor: Normal bulk and tone. Normal strength in all tested extremity muscles. ?Sensory.: intact to touch , pinprick , position and vibratory sensation.  ?Coordination: Rapid alternating movements normal in all extremities. Finger-to-nose and heel-to-shin performed accurately bilaterally. ?Gait and Station: Arises from chair without difficulty. Stance is normal. Gait demonstrates normal stride length and balance . Able to heel, toe and tandem walk with mild t difficulty.  ?Reflexes: 1+ and symmetric. Toes downgoing.  ?  ? ? ?ASSESSMENT/PLAN: 81 year old pleasant  Caucasian lady with bilateral posterior cerebral artery embolic strokes and December 2021 from new onset atrial flutter.  She is doing remarkably well with no residual deficits.  Vascular risk factors of atrial flutte

## 2022-02-05 ENCOUNTER — Ambulatory Visit: Payer: Medicare Other | Admitting: Adult Health

## 2022-03-18 NOTE — Progress Notes (Signed)
HPI: FU atrial fibrillation.  Patient admitted December 2021 with altered mental status.  MRI consistent with posterior circulation infarct.  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  Echocardiogram December 2021 showed normal LV function.  Patient noted to have paroxysmal atrial fibrillation during hospitalization and apixaban added.  Carotid Doppler September 2022 showed 1 to 39% bilateral stenosis.  Since last seen, she denies dyspnea, chest pain, palpitations, syncope or bleeding.  Current Outpatient Medications  Medication Sig Dispense Refill   acetaminophen (TYLENOL) 500 MG tablet Take 500 mg by mouth every 6 (six) hours as needed.     apixaban (ELIQUIS) 5 MG TABS tablet Take 1 tablet (5 mg total) by mouth 2 (two) times daily. 180 tablet 3   atorvastatin (LIPITOR) 40 MG tablet TAKE 1 TABLET BY MOUTH EVERY DAY 90 tablet 3   Calcium Carbonate-Vitamin D (CALCIUM 600 + D PO) Take 1 tablet by mouth daily.     diltiazem (CARDIZEM CD) 120 MG 24 hr capsule Take 120 mg by mouth daily.     loratadine (CLARITIN) 10 MG tablet Take 10 mg by mouth daily.     Multiple Vitamins-Minerals (CENTRUM SILVER PO) Take 1 tablet by mouth daily.     tiZANidine (ZANAFLEX) 4 MG tablet Take 0.5-1 tablets (2-4 mg total) by mouth every 12 (twelve) hours as needed for muscle spasms. Please establish with a new provider for further refills 60 tablet 1   traMADol (ULTRAM) 50 MG tablet TAKE 1 TABLET BY MOUTH AT BEDTIME AS NEEDED FOR PAIN 30 tablet 0   No current facility-administered medications for this visit.     Past Medical History:  Diagnosis Date   Allergic rhinitis    CVA (cerebral vascular accident) ([facility] [place])    Hypertension    Migraine headache    Osteoporosis    PMS (premenstrual syndrome)    Rosacea     Past Surgical History:  Procedure Laterality Date   childbirthx2     COLONOSCOPY  06/2003    Social History   Socioeconomic  History   Marital status: Widowed    Spouse name: Not on file   Number of children: 2   Years of education: Not on file   Highest education level: Not on file  Occupational History   Not on file  Tobacco Use   Smoking status: Former    Types: Cigarettes    Quit date: 10/20/1976    Years since quitting: 45.4   Smokeless tobacco: Never  Substance and Sexual Activity   Alcohol use: No   Drug use: No   Sexual activity: Not on file  Other Topics Concern   Not on file  Social History Narrative   Lives alone   Right handed   Drinks no caffeine daily   Social Determinants of Health   Financial Resource Strain: Low Risk    Difficulty of Paying Living Expenses: Not hard at all  Food Insecurity: No Food Insecurity   Worried About Charity fundraiser in the Last Year: Never true   [place] in the Last Year: Never true  Transportation Needs: No Transportation Needs   Lack of Transportation (Medical): No   Lack of Transportation (Non-Medical): No  Physical Activity: Insufficiently Active   Days of Exercise per Week: 3 days   Minutes of Exercise per Session: 30 min  Stress: No Stress Concern Present   Feeling of Stress : Not at  all  Social Connections: Moderately Isolated   Frequency of Communication with Friends and Family: Twice a week   Frequency of Social Gatherings with Friends and Family: Twice a week   Attends Religious Services: More than 4 times per year   Active Member of [employer] or Organizations: No   Attends Archivist Meetings: Never   Marital Status: Widowed  Human resources officer Violence: Not At Risk   Fear of Current or Ex-Partner: No   Emotionally Abused: No   Physically Abused: No   Sexually Abused: No    Family History  Problem Relation Age of Onset   Colon polyps Sister    Colon polyps Brother    Breast cancer Maternal Aunt    CAD Mother    Colon cancer Neg Hx     ROS: no fevers or chills, productive cough, hemoptysis, dysphasia,  odynophagia, melena, hematochezia, dysuria, hematuria, rash, seizure activity, orthopnea, PND, pedal edema, claudication. Remaining systems are negative.  Physical Exam: Well-developed well-nourished in no acute distress.  Skin is warm and dry.  HEENT is normal.  Neck is supple.  Chest is clear to auscultation with normal expansion.  Cardiovascular exam is irregular Abdominal exam nontender or distended. No masses palpated. Extremities show no edema. neuro grossly intact  ECG-atrial fibrillation at a rate of 75, normal axis, no ST changes.  Personally reviewed  A/P  1 permanent atrial fibrillation-plan to continue Cardizem for rate control.  Continue apixaban.  2 hypertension-blood pressure mildly elevated.  However she follows this at home and it is typically controlled.  Continue present medications and follow.  3 hyperlipidemia-continue statin.  Kirk Ruths, MD

## 2022-03-26 ENCOUNTER — Encounter: Payer: Self-pay | Admitting: Cardiology

## 2022-03-26 ENCOUNTER — Ambulatory Visit (INDEPENDENT_AMBULATORY_CARE_PROVIDER_SITE_OTHER): Payer: Medicare Other | Admitting: Cardiology

## 2022-03-26 VITALS — BP 146/88 | HR 75 | Ht 61.0 in | Wt 138.1 lb

## 2022-03-26 DIAGNOSIS — E7800 Pure hypercholesterolemia, unspecified: Secondary | ICD-10-CM

## 2022-03-26 DIAGNOSIS — I10 Essential (primary) hypertension: Secondary | ICD-10-CM

## 2022-03-26 DIAGNOSIS — I480 Paroxysmal atrial fibrillation: Secondary | ICD-10-CM

## 2022-03-26 NOTE — Patient Instructions (Signed)
Medication Instructions:   XARELTO TO REPLACE ELIQUIS  *If you need a refill on your cardiac medications before your next appointment, please call your pharmacy   Follow-Up: At [facility], you and your health needs are our priority.  As part of our continuing mission to provide you with exceptional heart care, we have created designated Provider Care Teams.  These Care Teams include your primary Cardiologist (physician) and Advanced Practice Providers (APPs -  Physician Assistants and Nurse Practitioners) who all work together to provide you with the care you need, when you need it.  We recommend signing up for the patient portal called "MyChart".  Sign up information is provided on this After Visit Summary.  MyChart is used to connect with patients for Virtual Visits (Telemedicine).  Patients are able to view lab/test results, encounter notes, upcoming appointments, etc.  Non-urgent messages can be sent to your provider as well.   To learn more about what you can do with MyChart, go to [email or web address].    Your next appointment:   6 month(s)  The format for your next appointment:   In Person  Provider:   Kirk Ruths, MD      Important Information About Sugar

## 2022-04-15 ENCOUNTER — Telehealth: Payer: Self-pay | Admitting: Pharmacist

## 2022-04-15 ENCOUNTER — Telehealth: Payer: Self-pay

## 2022-04-15 DIAGNOSIS — I10 Essential (primary) hypertension: Secondary | ICD-10-CM

## 2022-04-15 NOTE — Chronic Care Management (AMB) (Signed)
    Chronic Care Management Pharmacy Assistant   Name: Tricia Newman  MRN: [id number] DOB: Jul 28, 1941  Reason for Encounter: Reschedule follow up call per Jeni Salles   Recent office visits:  None  Recent consult visits:  03/26/22 [name], MD (Cardiology) - Patient presented for Paroxysmal Atrial Fibrillation and other concerns. Noted that Xarelto was to replace her Eliquis.   Hospital visits:  None in previous 6 months  Medications: Outpatient Encounter Medications as of 04/15/2022  Medication Sig   acetaminophen (TYLENOL) 500 MG tablet Take 500 mg by mouth every 6 (six) hours as needed.   apixaban (ELIQUIS) 5 MG TABS tablet Take 1 tablet (5 mg total) by mouth 2 (two) times daily.   atorvastatin (LIPITOR) 40 MG tablet TAKE 1 TABLET BY MOUTH EVERY DAY   Calcium Carbonate-Vitamin D (CALCIUM 600 + D PO) Take 1 tablet by mouth daily.   diltiazem (CARDIZEM CD) 120 MG 24 hr capsule Take 120 mg by mouth daily.   loratadine (CLARITIN) 10 MG tablet Take 10 mg by mouth daily.   Multiple Vitamins-Minerals (CENTRUM SILVER PO) Take 1 tablet by mouth daily.   tiZANidine (ZANAFLEX) 4 MG tablet Take 0.5-1 tablets (2-4 mg total) by mouth every 12 (twelve) hours as needed for muscle spasms. Please establish with a new provider for further refills   traMADol (ULTRAM) 50 MG tablet TAKE 1 TABLET BY MOUTH AT BEDTIME AS NEEDED FOR PAIN   No facility-administered encounter medications on file as of 04/15/2022.  Notes: Due to conflict in schedule call to patient to reschedule her follow up call with Jeni Salles to 05/06/22 at 11:30 did not reach patient. Left voicemail stating that her original date of 7/14 had been cancelled and my contact information in the event that the new day or time does not work with her schedule.  Care Gaps: COVID Booster - Overdue BP- 146/88 03/26/22 AWV- 10/22  Star Rating Drugs: Atorvastatin (Lipitor) 40 mg - Last filled 01/21/22 90 DS at [place] Pharmacist Assistant [phone]

## 2022-05-02 ENCOUNTER — Telehealth: Payer: Medicare Other

## 2022-05-06 ENCOUNTER — Telehealth: Payer: Self-pay | Admitting: Pharmacist

## 2022-05-06 ENCOUNTER — Telehealth: Payer: Medicare Other

## 2022-05-06 NOTE — Chronic Care Management (AMB) (Signed)
    Chronic Care Management Pharmacy Assistant   Name: Tricia Newman  MRN: [id number] DOB: May 06, 1941   Reason for Encounter: Offer to reschedule follow up per [facility]   Recent office visits:  none  Recent consult visits:  none  Hospital visits:  None in previous 6 months  Medications: Outpatient Encounter Medications as of 05/06/2022  Medication Sig   acetaminophen (TYLENOL) 500 MG tablet Take 500 mg by mouth every 6 (six) hours as needed.   apixaban (ELIQUIS) 5 MG TABS tablet Take 1 tablet (5 mg total) by mouth 2 (two) times daily.   atorvastatin (LIPITOR) 40 MG tablet TAKE 1 TABLET BY MOUTH EVERY DAY   Calcium Carbonate-Vitamin D (CALCIUM 600 + D PO) Take 1 tablet by mouth daily.   diltiazem (CARDIZEM CD) 120 MG 24 hr capsule Take 120 mg by mouth daily.   loratadine (CLARITIN) 10 MG tablet Take 10 mg by mouth daily.   Multiple Vitamins-Minerals (CENTRUM SILVER PO) Take 1 tablet by mouth daily.   tiZANidine (ZANAFLEX) 4 MG tablet Take 0.5-1 tablets (2-4 mg total) by mouth every 12 (twelve) hours as needed for muscle spasms. Please establish with a new provider for further refills   traMADol (ULTRAM) 50 MG tablet TAKE 1 TABLET BY MOUTH AT BEDTIME AS NEEDED FOR PAIN   No facility-administered encounter medications on file as of 05/06/2022.  Notes: Call to patient to offer follow up with Jeni Salles Clinical pharmacist did not reach left voicemail with my contact information for return call.  Care Gaps: COVID Booster - Overdue BP- 146/88 03/26/22 AWV- 10/22  Star Rating Drugs: Atorvastatin (Lipitor) 40 mg - Last filled 04/19/22 90 DS at [place] Pharmacist Assistant [phone]

## 2022-05-23 ENCOUNTER — Other Ambulatory Visit: Payer: Self-pay | Admitting: Cardiology

## 2022-05-23 DIAGNOSIS — I480 Paroxysmal atrial fibrillation: Secondary | ICD-10-CM

## 2022-05-23 DIAGNOSIS — I10 Essential (primary) hypertension: Secondary | ICD-10-CM

## 2022-06-11 ENCOUNTER — Telehealth: Payer: Self-pay | Admitting: Family Medicine

## 2022-06-11 ENCOUNTER — Other Ambulatory Visit: Payer: Self-pay | Admitting: Family Medicine

## 2022-06-11 DIAGNOSIS — I480 Paroxysmal atrial fibrillation: Secondary | ICD-10-CM

## 2022-06-11 MED ORDER — RIVAROXABAN 20 MG PO TABS: 20.0000 mg | ORAL_TABLET | Freq: Every day | ORAL | 0 refills | Status: DC

## 2022-06-11 NOTE — Telephone Encounter (Signed)
I sent Rx for Xarelto 20 mg to take daily with supper. Stop Eliquis and start Xarelto when she is due for her next dose of Eliquis. Thanks, BJ

## 2022-06-11 NOTE — Telephone Encounter (Signed)
I called and spoke with patient. She is aware of message below & verbalized understanding.

## 2022-06-11 NOTE — Telephone Encounter (Signed)
Pt called to say she needed a refill of the Xarelto.  This was previously prescribed by her Cardiologist.  Pt stated that the pharmacy told her she had to ask her PCP for a refill.  Pt added that MD can call Cardiologist (Dr. Stanford Breed) if needed.  Pt states that the Eliquis was too expensive, and she cannot afford it.  Pt's LOV:  11/23/2021  Please advise. [phone]  [id number] [place]  [phone] Fax:  [phone]

## 2022-06-26 ENCOUNTER — Telehealth: Payer: Self-pay | Admitting: Pharmacist

## 2022-06-26 NOTE — Chronic Care Management (AMB) (Signed)
    Chronic Care Management Pharmacy Assistant   Name: Karys Meckley  MRN: [id number] DOB: October 23, 1940  Reason for Encounter: Offer to reschedule missed follow up with Jeni Salles Clinical Pharm.   Recent office visits:  None   Recent consult visits:  03/26/22 [name], MD (Cardiology) - Patient presented for Paroxysmal atrial fibrillation and other concerns. Xarelto noted to replace Eliquis.  Hospital visits:  None in previous 6 months  Medications: Outpatient Encounter Medications as of 06/26/2022  Medication Sig   acetaminophen (TYLENOL) 500 MG tablet Take 500 mg by mouth every 6 (six) hours as needed.   atorvastatin (LIPITOR) 40 MG tablet TAKE 1 TABLET BY MOUTH EVERY DAY   Calcium Carbonate-Vitamin D (CALCIUM 600 + D PO) Take 1 tablet by mouth daily.   diltiazem (CARDIZEM CD) 120 MG 24 hr capsule TAKE 1 CAPSULE DAILY   loratadine (CLARITIN) 10 MG tablet Take 10 mg by mouth daily.   Multiple Vitamins-Minerals (CENTRUM SILVER PO) Take 1 tablet by mouth daily.   rivaroxaban (XARELTO) 20 MG TABS tablet Take 1 tablet (20 mg total) by mouth daily with supper.   tiZANidine (ZANAFLEX) 4 MG tablet Take 0.5-1 tablets (2-4 mg total) by mouth every 12 (twelve) hours as needed for muscle spasms. Please establish with a new provider for further refills   traMADol (ULTRAM) 50 MG tablet TAKE 1 TABLET BY MOUTH AT BEDTIME AS NEEDED FOR PAIN   No facility-administered encounter medications on file as of 06/26/2022.  Notes: Call to patient to offer to reschedule missed encounter for follow up with [place]. Did not reach left a voicemail for return call with my information.  Care Gaps: COVID Booster - Overdue Flu Vaccine - Overdue CCM- Need BP- 146/88 03/26/22 AWV- 10/22  Star Rating Drugs: Atorvastatin (Lipitor) 40 mg - Last filled 04/19/22 90 DS at [place] Pharmacist Assistant [phone]

## 2022-07-18 ENCOUNTER — Other Ambulatory Visit: Payer: Self-pay

## 2022-07-18 ENCOUNTER — Encounter (HOSPITAL_BASED_OUTPATIENT_CLINIC_OR_DEPARTMENT_OTHER): Payer: Self-pay

## 2022-07-18 ENCOUNTER — Emergency Department (HOSPITAL_BASED_OUTPATIENT_CLINIC_OR_DEPARTMENT_OTHER): Payer: Medicare Other

## 2022-07-18 ENCOUNTER — Emergency Department (HOSPITAL_BASED_OUTPATIENT_CLINIC_OR_DEPARTMENT_OTHER)
Admission: EM | Admit: 2022-07-18 | Discharge: 2022-07-18 | Disposition: A | Discharge status: Home / Self Care | Payer: Medicare Other | Attending: Emergency Medicine | Admitting: Emergency Medicine

## 2022-07-18 DIAGNOSIS — M4319 Spondylolisthesis, multiple sites in spine: Secondary | ICD-10-CM | POA: Diagnosis not present

## 2022-07-18 DIAGNOSIS — M549 Dorsalgia, unspecified: Secondary | ICD-10-CM | POA: Insufficient documentation

## 2022-07-18 DIAGNOSIS — M436 Torticollis: Secondary | ICD-10-CM | POA: Diagnosis not present

## 2022-07-18 DIAGNOSIS — Z79899 Other long term (current) drug therapy: Secondary | ICD-10-CM | POA: Insufficient documentation

## 2022-07-18 DIAGNOSIS — M5030 Other cervical disc degeneration, unspecified cervical region: Secondary | ICD-10-CM

## 2022-07-18 DIAGNOSIS — G8929 Other chronic pain: Secondary | ICD-10-CM | POA: Insufficient documentation

## 2022-07-18 DIAGNOSIS — S0990XA Unspecified injury of head, initial encounter: Secondary | ICD-10-CM | POA: Diagnosis not present

## 2022-07-18 DIAGNOSIS — Z7901 Long term (current) use of anticoagulants: Secondary | ICD-10-CM | POA: Insufficient documentation

## 2022-07-18 DIAGNOSIS — I10 Essential (primary) hypertension: Secondary | ICD-10-CM | POA: Insufficient documentation

## 2022-07-18 DIAGNOSIS — M2460 Ankylosis, unspecified joint: Secondary | ICD-10-CM

## 2022-07-18 DIAGNOSIS — S199XXA Unspecified injury of neck, initial encounter: Secondary | ICD-10-CM | POA: Diagnosis not present

## 2022-07-18 DIAGNOSIS — M47812 Spondylosis without myelopathy or radiculopathy, cervical region: Secondary | ICD-10-CM | POA: Diagnosis not present

## 2022-07-18 DIAGNOSIS — M542 Cervicalgia: Secondary | ICD-10-CM | POA: Diagnosis present

## 2022-07-18 MED ORDER — ACETAMINOPHEN 500 MG PO TABS
1000.0000 mg | ORAL_TABLET | Freq: Once | ORAL | Status: AC
  Administered 2022-07-18: 1000 mg via ORAL
  Filled 2022-07-18: qty 2

## 2022-07-18 MED ORDER — LIDOCAINE 5 % EX PTCH
1.0000 | MEDICATED_PATCH | Freq: Once | CUTANEOUS | Status: DC
  Administered 2022-07-18: 1 via TRANSDERMAL
  Filled 2022-07-18: qty 1

## 2022-07-18 MED ORDER — LIDOCAINE 5 % EX PTCH: 1.0000 | MEDICATED_PATCH | CUTANEOUS | 0 refills | Status: DC

## 2022-07-18 MED ORDER — DICLOFENAC SODIUM 1 % EX GEL: 4.0000 g | Freq: Four times a day (QID) | CUTANEOUS | 0 refills | Status: DC

## 2022-07-18 NOTE — ED Provider Notes (Signed)
[facility]ENC[facility] DEPARTMENT Provider Note   CSN: [id number] Arrival date & time: 07/18/22  0737     History  Chief Complaint  Patient presents with   Neck Pain    Tricia Newman is a 81 y.o. female.  The history is provided by the patient.  Neck Pain Pain location: posterior and lateral neck. Quality:  Cramping Pain radiates to:  Does not radiate Pain severity:  Moderate Pain is:  Same all the time Onset quality:  Sudden Duration:  2 days Timing:  Constant Progression:  Unchanged Chronicity: has had neck pain in the past this time is worse. Context: not lifting a heavy object and not recent injury   Relieved by:  Nothing Worsened by:  Nothing Ineffective treatments: she cannot take medication secondary to nausea. Associated symptoms: no bladder incontinence, no chest pain, no fever, no leg pain, no numbness, no photophobia, no visual change and no weakness   Risk factors: no hx of head and neck radiation, no hx of spinal trauma and no recent epidural   Patient with chronic back pain and medication intolerance presents with neck pain that she woke up with 2 days ago.  No fevers, no rashes, no photophobia.  No visual or speech changes no weakness nor numbness.      Past Medical History:  Diagnosis Date   Allergic rhinitis    CVA (cerebral vascular accident) ([place])    Hypertension    Migraine headache    Osteoporosis    PMS (premenstrual syndrome)    Rosacea   '   Home Medications Prior to Admission medications   Medication Sig Start Date End Date Taking? Authorizing Provider  diclofenac Sodium (VOLTAREN) 1 % GEL Apply 4 g topically 4 (four) times daily. 07/18/22  Yes Barbar Brede, MD  lidocaine (LIDODERM) 5 % Place 1 patch onto the skin daily. Remove & Discard patch within 12 hours or as directed by MD 07/18/22  Yes Chene Kasinger, MD  acetaminophen (TYLENOL) 500 MG tablet Take 500 mg by mouth every 6 (six) hours as needed.    [provider]  atorvastatin (LIPITOR) 40 MG tablet TAKE 1 TABLET BY MOUTH EVERY DAY 09/02/21   [name], [name], MD  Calcium Carbonate-Vitamin D (CALCIUM 600 + D PO) Take 1 tablet by mouth daily.    [provider]  diltiazem (CARDIZEM CD) 120 MG 24 hr capsule TAKE 1 CAPSULE DAILY 05/26/22   [name], MD  loratadine (CLARITIN) 10 MG tablet Take 10 mg by mouth daily.    [provider]  Multiple Vitamins-Minerals (CENTRUM SILVER PO) Take 1 tablet by mouth daily.    [provider]  rivaroxaban (XARELTO) 20 MG TABS tablet Take 1 tablet (20 mg total) by mouth daily with supper. 06/11/22   [name], [name], MD  tiZANidine (ZANAFLEX) 4 MG tablet Take 0.5-1 tablets (2-4 mg total) by mouth every 12 (twelve) hours as needed for muscle spasms. Please establish with a new provider for further refills 08/01/20   [name], [name], MD  traMADol (ULTRAM) 50 MG tablet TAKE 1 TABLET BY MOUTH AT BEDTIME AS NEEDED FOR PAIN 11/30/21   [name], [name], MD      Allergies    Patient has no known allergies.    Review of Systems   Review of Systems  Constitutional:  Negative for fever.  HENT:  Negative for facial swelling.   Eyes:  Negative for photophobia.  Respiratory:  Negative for wheezing and  stridor.   Cardiovascular:  Negative for chest pain.  Genitourinary:  Negative for bladder incontinence.  Musculoskeletal:  Positive for neck pain.  Neurological:  Negative for weakness and numbness.  All other systems reviewed and are negative.   Physical Exam Updated Vital Signs BP (!) 155/97 (BP Location: Left Arm)   Pulse 87   Temp 98 F (36.7 C) (Oral)   Resp 19   Ht '5\' 1"'$  (1.549 m)   Wt 62.6 kg   SpO2 100%   BMI 26.07 kg/m  Physical Exam Vitals and nursing note reviewed.  Constitutional:      General: She is not in acute distress.    Appearance: She is well-developed.  HENT:     Head: Normocephalic and atraumatic.     Nose: Nose normal.  Eyes:     Pupils: Pupils  are equal, round, and reactive to light.  Neck:     Thyroid: No thyroid mass, thyromegaly or thyroid tenderness.     Vascular: No carotid bruit.     Trachea: Trachea and phonation normal.     Meningeal: Brudzinski's sign and Kernig's sign absent.     Comments: Moves neck but has pain with side to side motion  Cardiovascular:     Rate and Rhythm: Normal rate and regular rhythm.     Pulses: Normal pulses.     Heart sounds: Normal heart sounds.  Pulmonary:     Effort: Pulmonary effort is normal. No respiratory distress.     Breath sounds: Normal breath sounds.  Abdominal:     General: Bowel sounds are normal. There is no distension.     Palpations: Abdomen is soft.     Tenderness: There is no abdominal tenderness. There is no guarding or rebound.  Genitourinary:    Vagina: No vaginal discharge.  Musculoskeletal:        General: Normal range of motion.     Cervical back: Neck supple. Torticollis present. No edema, erythema, rigidity or crepitus. Pain with movement present. No spinous process tenderness or muscular tenderness.  Lymphadenopathy:     Cervical: No cervical adenopathy.  Skin:    General: Skin is warm and dry.     Capillary Refill: Capillary refill takes less than 2 seconds.     Findings: No erythema or rash.  Neurological:     General: No focal deficit present.     Deep Tendon Reflexes: Reflexes normal.  Psychiatric:        Mood and Affect: Mood normal.     ED Results / Procedures / Treatments   Labs (all labs ordered are listed, but only abnormal results are displayed) Labs Reviewed - No data to display  EKG None  Radiology CT Cervical Spine Wo Contrast  Result Date: 07/18/2022 CLINICAL DATA:  81 year old female "blunt trauma". EXAM: CT CERVICAL SPINE WITHOUT CONTRAST TECHNIQUE: Multidetector CT imaging of the cervical spine was performed without intravenous contrast. Multiplanar CT image reconstructions were also generated. RADIATION DOSE REDUCTION: This exam  was performed according to the departmental dose-optimization program which includes automated exposure control, adjustment of the mA and/or kV according to patient size and/or use of iterative reconstruction technique. COMPARISON:  Head CT today. FINDINGS: Alignment: Straightening of cervical lordosis with degenerative appearing mild anterolisthesis of C3 on C4 and C4 on C5. Chronic bilateral facet ankylosis at the latter. Subtle anterolisthesis of C7 on T1. Bilateral posterior element alignment is within normal limits. Skull base and vertebrae: Visualized skull base is intact. No atlanto-occipital dissociation. C1 and  C2 appear intact and aligned. No acute osseous abnormality identified. Soft tissues and spinal canal: No prevertebral fluid or swelling. No visible canal hematoma. Negative for age visible noncontrast neck soft tissues. Disc levels: Degenerative appearing ankylosis at C2-C3, C4-C5. Intervening anterolisthesis with facet arthropathy at C3-C4. And severe chronic disc and endplate degeneration G2-R4 and C6-C7. Subsequent mild spinal stenosis is possible at C5-C6. Upper chest: Negative. IMPRESSION: 1. No acute traumatic injury identified in the cervical spine. 2. Cervical spine degeneration with multilevel ankylosis and spondylolisthesis. Possible mild degenerative spinal stenosis at C5-C6. Electronically Signed   By: Genevie Ann M.D.   On: 07/18/2022 04:25   CT Head Wo Contrast  Result Date: 07/18/2022 CLINICAL DATA:  81 year old female "blunt trauma". EXAM: CT HEAD WITHOUT CONTRAST TECHNIQUE: Contiguous axial images were obtained from the base of the skull through the vertex without intravenous contrast. RADIATION DOSE REDUCTION: This exam was performed according to the departmental dose-optimization program which includes automated exposure control, adjustment of the mA and/or kV according to patient size and/or use of iterative reconstruction technique. COMPARISON:  Head CT and Brain MRI 10/05/2020.  FINDINGS: Brain: Cerebral volume is within normal limits for age. No midline shift, ventriculomegaly, mass effect, evidence of mass lesion, intracranial hemorrhage or evidence of cortically based acute infarction. Stable gray-white matter differentiation throughout the brain. Patchy mild to moderate bilateral white matter hypodensity. Faint basal ganglia vascular or dystrophic calcifications again noted. Vascular: Calcified atherosclerosis at the skull base. No suspicious intracranial vascular hyperdensity. Skull: No acute osseous abnormality identified. Sinuses/Orbits: Visualized paranasal sinuses and mastoids are clear. Other: No orbit or scalp soft tissue injury identified. IMPRESSION: No acute intracranial abnormality or acute traumatic injury identified. Stable non contrast CT appearance of the brain since 10/05/2020. Electronically Signed   By: Genevie Ann M.D.   On: 07/18/2022 04:22    Procedures Procedures    Medications Ordered in ED Medications  lidocaine (LIDODERM) 5 % 1 patch (1 patch Transdermal Patch Applied 07/18/22 0306)  acetaminophen (TYLENOL) tablet 1,000 mg (1,000 mg Oral Given 07/18/22 0306)    ED Course/ Medical Decision Making/ A&P                           Medical Decision Making Patient with neck pain that she awoke with 2 days ago  Amount and/or Complexity of Data Reviewed External Data Reviewed: notes.    Details: Previous notes reviewed  Radiology: ordered and independent interpretation performed.    Details: No acute findings on head CT.  DDD on CT cervical no fractures by me   Risk OTC drugs. Prescription drug management. Risk Details: Patient feels markedly improved post medication and is moving neck easily.  I do not believe this is infectious.  Patient is not febrile.  No tick exposure.  No infectious complaints.  Exam and history are not consistent with meningitis.  No headache.      Final Clinical Impression(s) / ED Diagnoses Final diagnoses:  Torticollis   Ankylosis  DDD (degenerative disc disease), cervical  Return for intractable cough, coughing up blood, fevers > 100.4 unrelieved by medication, shortness of breath, intractable vomiting, chest pain, shortness of breath, weakness, numbness, changes in speech, facial asymmetry, abdominal pain, passing out, Inability to tolerate liquids or food, cough, altered mental status or any concerns. No signs of systemic illness or infection. The patient is nontoxic-appearing on exam and vital signs are within normal limits.  I have reviewed the triage vital signs  and the nursing notes. Pertinent labs & imaging results that were available during my care of the patient were reviewed by me and considered in my medical decision making (see chart for details). After history, exam, and medical workup I feel the patient has been appropriately medically screened and is safe for discharge home. Pertinent diagnoses were discussed with the patient. Patient was given return precautions.   Rx / DC Orders ED Discharge Orders          Ordered    lidocaine (LIDODERM) 5 %  Every 24 hours        07/18/22 0434    diclofenac Sodium (VOLTAREN) 1 % GEL  4 times daily        07/18/22 [place], Juriel Cid, MD 07/18/22 8934

## 2022-07-18 NOTE — ED Notes (Signed)
Heat pack placed on pt's neck with a towel. Pt states it feels great.

## 2022-07-18 NOTE — ED Triage Notes (Signed)
Pt c/o neck pain and stiffness x1 day after waking up. Pain worsens with movement. Denies injury. Mobility/sensation intact. No other symptoms.

## 2022-08-18 ENCOUNTER — Ambulatory Visit (INDEPENDENT_AMBULATORY_CARE_PROVIDER_SITE_OTHER): Payer: Medicare Other

## 2022-08-18 VITALS — Ht 62.0 in | Wt 138.0 lb

## 2022-08-18 DIAGNOSIS — Z0000 Encounter for general adult medical examination without abnormal findings: Secondary | ICD-10-CM | POA: Diagnosis not present

## 2022-08-18 NOTE — Patient Instructions (Signed)
[name] , Thank you for taking time to come for your Medicare Wellness Visit. I appreciate your ongoing commitment to your health goals. Please review the following plan we discussed and let me know if I can assist you in the future.   Screening recommendations/referrals: Colonoscopy: not required Mammogram: not required Bone Density: completed 04/24/2020 Recommended yearly ophthalmology/optometry visit for glaucoma screening and checkup Recommended yearly dental visit for hygiene and checkup  Vaccinations: Influenza vaccine: due Pneumococcal vaccine: completed 08/09/2014 Tdap vaccine: completed 09/22/2016, due 09/22/2026 Shingles vaccine: complete   Covid-19: 08/18/2020, 02/06/2020, 01/12/2020  Advanced directives: Please bring a copy of your POA (Power of Attorney) and/or Living Will to your next appointment.   Conditions/risks identified: none  Next appointment: Follow up in one year for your annual wellness visit    Preventive Care 65 Years and Older, Female Preventive care refers to lifestyle choices and visits with your health care provider that can promote health and wellness. What does preventive care include? A yearly physical exam. This is also called an annual well check. Dental exams once or twice a year. Routine eye exams. Ask your health care provider how often you should have your eyes checked. Personal lifestyle choices, including: Daily care of your teeth and gums. Regular physical activity. Eating a healthy diet. Avoiding tobacco and drug use. Limiting alcohol use. Practicing safe sex. Taking low-dose aspirin every day. Taking vitamin and mineral supplements as recommended by your health care provider. What happens during an annual well check? The services and screenings done by your health care provider during your annual well check will depend on your age, overall health, lifestyle risk factors, and family history of disease. Counseling  Your health care  provider may ask you questions about your: Alcohol use. Tobacco use. Drug use. Emotional well-being. Home and relationship well-being. Sexual activity. Eating habits. History of falls. Memory and ability to understand (cognition). Work and work Statistician. Reproductive health. Screening  You may have the following tests or measurements: Height, weight, and BMI. Blood pressure. Lipid and cholesterol levels. These may be checked every 5 years, or more frequently if you are over 80 years old. Skin check. Lung cancer screening. You may have this screening every year starting at age 93 if you have a 30-pack-year history of smoking and currently smoke or have quit within the past 15 years. Fecal occult blood test (FOBT) of the stool. You may have this test every year starting at age 19. Flexible sigmoidoscopy or colonoscopy. You may have a sigmoidoscopy every 5 years or a colonoscopy every 10 years starting at age 36. Hepatitis C blood test. Hepatitis B blood test. Sexually transmitted disease (STD) testing. Diabetes screening. This is done by checking your blood sugar (glucose) after you have not eaten for a while (fasting). You may have this done every 1-3 years. Bone density scan. This is done to screen for osteoporosis. You may have this done starting at age 51. Mammogram. This may be done every 1-2 years. Talk to your health care provider about how often you should have regular mammograms. Talk with your health care provider about your test results, treatment options, and if necessary, the need for more tests. Vaccines  Your health care provider may recommend certain vaccines, such as: Influenza vaccine. This is recommended every year. Tetanus, diphtheria, and acellular pertussis (Tdap, Td) vaccine. You may need a Td booster every 10 years. Zoster vaccine. You may need this after age 62. Pneumococcal 13-valent conjugate (PCV13) vaccine. One dose is  recommended after age  76. Pneumococcal polysaccharide (PPSV23) vaccine. One dose is recommended after age 10. Talk to your health care provider about which screenings and vaccines you need and how often you need them. This information is not intended to replace advice given to you by your health care provider. Make sure you discuss any questions you have with your health care provider. Document Released: 11/02/2015 Document Revised: 06/25/2016 Document Reviewed: 08/07/2015 Elsevier Interactive Patient Education  2017 [employer] Prevention in the Home Falls can cause injuries. They can happen to people of all ages. There are many things you can do to make your home safe and to help prevent falls. What can I do on the outside of my home? Regularly fix the edges of walkways and driveways and fix any cracks. Remove anything that might make you trip as you walk through a door, such as a raised step or threshold. Trim any bushes or trees on the path to your home. Use bright outdoor lighting. Clear any walking paths of anything that might make someone trip, such as rocks or tools. Regularly check to see if handrails are loose or broken. Make sure that both sides of any steps have handrails. Any raised decks and porches should have guardrails on the edges. Have any leaves, snow, or ice cleared regularly. Use sand or salt on walking paths during winter. Clean up any spills in your garage right away. This includes oil or grease spills. What can I do in the bathroom? Use night lights. Install grab bars by the toilet and in the tub and shower. Do not use towel bars as grab bars. Use non-skid mats or decals in the tub or shower. If you need to sit down in the shower, use a plastic, non-slip stool. Keep the floor dry. Clean up any water that spills on the floor as soon as it happens. Remove soap buildup in the tub or shower regularly. Attach bath mats securely with double-sided non-slip rug tape. Do not have throw  rugs and other things on the floor that can make you trip. What can I do in the bedroom? Use night lights. Make sure that you have a light by your bed that is easy to reach. Do not use any sheets or blankets that are too big for your bed. They should not hang down onto the floor. Have a firm chair that has side arms. You can use this for support while you get dressed. Do not have throw rugs and other things on the floor that can make you trip. What can I do in the kitchen? Clean up any spills right away. Avoid walking on wet floors. Keep items that you use a lot in easy-to-reach places. If you need to reach something above you, use a strong step stool that has a grab bar. Keep electrical cords out of the way. Do not use floor polish or wax that makes floors slippery. If you must use wax, use non-skid floor wax. Do not have throw rugs and other things on the floor that can make you trip. What can I do with my stairs? Do not leave any items on the stairs. Make sure that there are handrails on both sides of the stairs and use them. Fix handrails that are broken or loose. Make sure that handrails are as long as the stairways. Check any carpeting to make sure that it is firmly attached to the stairs. Fix any carpet that is loose or worn. Avoid having  throw rugs at the top or bottom of the stairs. If you do have throw rugs, attach them to the floor with carpet tape. Make sure that you have a light switch at the top of the stairs and the bottom of the stairs. If you do not have them, ask someone to add them for you. What else can I do to help prevent falls? Wear shoes that: Do not have high heels. Have rubber bottoms. Are comfortable and fit you well. Are closed at the toe. Do not wear sandals. If you use a stepladder: Make sure that it is fully opened. Do not climb a closed stepladder. Make sure that both sides of the stepladder are locked into place. Ask someone to hold it for you, if  possible. Clearly mark and make sure that you can see: Any grab bars or handrails. First and last steps. Where the edge of each step is. Use tools that help you move around (mobility aids) if they are needed. These include: Canes. Walkers. Scooters. Crutches. Turn on the lights when you go into a dark area. Replace any light bulbs as soon as they burn out. Set up your furniture so you have a clear path. Avoid moving your furniture around. If any of your floors are uneven, fix them. If there are any pets around you, be aware of where they are. Review your medicines with your doctor. Some medicines can make you feel dizzy. This can increase your chance of falling. Ask your doctor what other things that you can do to help prevent falls. This information is not intended to replace advice given to you by your health care provider. Make sure you discuss any questions you have with your health care provider. Document Released: 08/02/2009 Document Revised: 03/13/2016 Document Reviewed: 11/10/2014 Elsevier Interactive Patient Education  2017 [employer].

## 2022-08-18 NOTE — Progress Notes (Signed)
I connected with Tricia Newman today by telephone and verified that I am speaking with the correct person using two identifiers. Location patient: home Location provider: work Persons participating in the virtual visit: Garlene, [place] LPN.   I discussed the limitations, risks, security and privacy concerns of performing an evaluation and management service by telephone and the availability of in person appointments. I also discussed with the patient that there may be a patient responsible charge related to this service. The patient expressed understanding and verbally consented to this telephonic visit.    Interactive audio and video telecommunications were attempted between this provider and patient, however failed, due to patient having technical difficulties OR patient did not have access to video capability.  We continued and completed visit with audio only.     Vital signs may be patient reported or missing.  Subjective:   Tricia Newman is a 81 y.o. female who presents for Medicare Annual (Subsequent) preventive examination.  Review of Systems     Cardiac Risk Factors include: advanced age (>22mn, >>91women);dyslipidemia;hypertension     Objective:    Today's Vitals   08/18/22 1041  Weight: 138 lb (62.6 kg)  Height: '5\' 2"'$  (1.575 m)   Body mass index is 25.24 kg/m.     08/18/2022   10:48 AM 07/18/2022    4:06 AM 08/16/2021    8:27 AM 10/05/2020   11:12 AM 12/22/2017    3:51 PM  Advanced Directives  Does Patient Have a Medical Advance Directive? Yes No Yes No Yes  Type of AParamedicof [place] will  Living will  Living will  Copy of [place] Chart? No - copy requested      Would patient like information on creating a medical advance directive?  No - Patient declined  No - Guardian declined     Current Medications (verified) Outpatient Encounter Medications as of 08/18/2022  Medication Sig    atorvastatin (LIPITOR) 40 MG tablet TAKE 1 TABLET BY MOUTH EVERY DAY   Calcium Carbonate-Vitamin D (CALCIUM 600 + D PO) Take 1 tablet by mouth daily.   diclofenac Sodium (VOLTAREN) 1 % GEL Apply 4 g topically 4 (four) times daily.   diltiazem (CARDIZEM CD) 120 MG 24 hr capsule TAKE 1 CAPSULE DAILY   lidocaine (LIDODERM) 5 % Place 1 patch onto the skin daily. Remove & Discard patch within 12 hours or as directed by MD   loratadine (CLARITIN) 10 MG tablet Take 10 mg by mouth daily.   Multiple Vitamins-Minerals (CENTRUM SILVER PO) Take 1 tablet by mouth daily.   rivaroxaban (XARELTO) 20 MG TABS tablet Take 1 tablet (20 mg total) by mouth daily with supper.   tiZANidine (ZANAFLEX) 4 MG tablet Take 0.5-1 tablets (2-4 mg total) by mouth every 12 (twelve) hours as needed for muscle spasms. Please establish with a new provider for further refills   traMADol (ULTRAM) 50 MG tablet TAKE 1 TABLET BY MOUTH AT BEDTIME AS NEEDED FOR PAIN   acetaminophen (TYLENOL) 500 MG tablet Take 500 mg by mouth every 6 (six) hours as needed. (Patient not taking: Reported on 08/18/2022)   No facility-administered encounter medications on file as of 08/18/2022.    Allergies (verified) Patient has no known allergies.   History: Past Medical History:  Diagnosis Date   Allergic rhinitis    CVA (cerebral vascular accident) ([place]    Hypertension    Migraine headache    Osteoporosis  PMS (premenstrual syndrome)    Rosacea    Past Surgical History:  Procedure Laterality Date   childbirthx2     COLONOSCOPY  06/2003   Family History  Problem Relation Age of Onset   Colon polyps Sister    Colon polyps Brother    Breast cancer Maternal Aunt    CAD Mother    Colon cancer Neg Hx    Social History   Socioeconomic History   Marital status: Widowed    Spouse name: Not on file   Number of children: 2   Years of education: Not on file   Highest education level: Not on file  Occupational History   Not on file   Tobacco Use   Smoking status: Former    Types: Cigarettes    Quit date: 10/20/1976    Years since quitting: 45.8   Smokeless tobacco: Never  Vaping Use   Vaping Use: Never used  Substance and Sexual Activity   Alcohol use: No   Drug use: No   Sexual activity: Not on file  Other Topics Concern   Not on file  Social History Narrative   Lives alone   Right handed   Drinks no caffeine daily   Social Determinants of Health   Financial Resource Strain: Low Risk  (08/18/2022)   Overall Financial Resource Strain (CARDIA)    Difficulty of Paying Living Expenses: Not hard at all  Food Insecurity: No Food Insecurity (08/18/2022)   Hunger Vital Sign    Worried About Running Out of Food in the Last Year: Never true    [place] in the Last Year: Never true  Transportation Needs: No Transportation Needs (08/18/2022)   PRAPARE - Hydrologist (Medical): No    Lack of Transportation (Non-Medical): No  Physical Activity: Inactive (08/18/2022)   Exercise Vital Sign    Days of Exercise per Week: 0 days    Minutes of Exercise per Session: 0 min  Stress: No Stress Concern Present (08/18/2022)   [place]    Feeling of Stress : Not at all  Social Connections: Moderately Isolated (08/16/2021)   Social Connection and Isolation Panel [NHANES]    Frequency of Communication with Friends and Family: Twice a week    Frequency of Social Gatherings with Friends and Family: Twice a week    Attends Religious Services: More than 4 times per year    Active Member of [employer] or Organizations: No    Attends Archivist Meetings: Never    Marital Status: Widowed    Tobacco Counseling Counseling given: Not Answered   Clinical Intake:  Pre-visit preparation completed: Yes  Pain : No/denies pain     Nutritional Status: BMI 25 -29 Overweight Nutritional Risks: None Diabetes: No  How often  do you need to have someone help you when you read instructions, pamphlets, or other written materials from your doctor or pharmacy?: 1 - Never What is the last grade level you completed in school?: 12th grade  Diabetic? no  Interpreter Needed?: No  Information entered by :: NAllen LPN   Activities of Daily Living    08/18/2022   10:51 AM  In your present state of health, do you have any difficulty performing the following activities:  Hearing? 0  Vision? 0  Difficulty concentrating or making decisions? 1  Comment memory since stroke  Walking or climbing stairs? 0  Dressing or bathing? 0  Doing errands, shopping? 0  Preparing Food and eating ? N  Using the Toilet? N  In the past six months, have you accidently leaked urine? N  Do you have problems with loss of bowel control? N  Managing your Medications? N  Managing your Finances? N  Housekeeping or managing your Housekeeping? N    Patient Care Team: [name], [name], MD as PCP - General (Family Medicine) Viona Gilmore, [facility] as Pharmacist (Pharmacist)  Indicate any recent Medical Services you may have received from other than [facility] providers in the past year (date may be approximate).     Assessment:   This is a routine wellness examination for Tricia Newman.  Hearing/Vision screen Vision Screening - Comments:: Regular eye exams,  Dietary issues and exercise activities discussed: Current Exercise Habits: The patient does not participate in regular exercise at present   Goals Addressed             This Visit's Progress    Patient Stated       08/18/2022, watches diet       Depression Screen    08/18/2022   10:50 AM 11/26/2021    8:20 AM 08/16/2021    8:28 AM 08/16/2021    8:25 AM 08/01/2020    6:53 AM 08/01/2019    7:37 AM 10/06/2017    8:19 AM  PHQ 2/9 Scores  PHQ - 2 Score 0 0 0 0 0 0 0    Fall Risk    08/18/2022   10:49 AM 08/16/2021    8:28 AM 08/01/2020    6:53 AM 08/01/2019    7:37 AM  09/09/2018    9:04 AM  Fall Risk   Falls in the past year? 0 0 0 0 1  Comment     Emmi Telephone Survey: data to providers prior to load  Number falls in past yr: 0 0  0 1  Comment     Emmi Telephone Survey Actual Response = 1  Injury with Fall? 0 0  0 0  Risk for fall due to : No Fall Risks;Medication side effect No Fall Risks  Orthopedic patient   Follow up Falls prevention discussed;Education provided;Falls evaluation completed Falls evaluation completed  Education provided     FALL RISK PREVENTION PERTAINING TO THE HOME:  Any stairs in or around the home? Yes  If so, are there any without handrails? No  Home free of loose throw rugs in walkways, pet beds, electrical cords, etc? Yes  Adequate lighting in your home to reduce risk of falls? Yes   ASSISTIVE DEVICES UTILIZED TO PREVENT FALLS:  Life alert? No  Use of a cane, walker or w/c? No  Grab bars in the bathroom? No  Shower chair or bench in shower? Yes  Elevated toilet seat or a handicapped toilet? Yes   TIMED UP AND GO:  Was the test performed? No .      Cognitive Function:        08/18/2022   10:52 AM  6CIT Screen  What Year? 0 points  What month? 0 points  What time? 3 points  Count back from 20 0 points  Months in reverse 0 points  Repeat phrase 2 points  Total Score 5 points    Immunizations Immunization History  Administered Date(s) Administered   Fluad Quad(high Dose 65+) 08/01/2019, 08/01/2020, 09/09/2021   Influenza Split 07/30/2011, 08/02/2012   Influenza Whole 10/20/2004, 07/21/2008, 07/31/2009, 06/27/2010   Influenza, High Dose Seasonal PF 08/04/2013, 07/24/2015, 07/22/2016,  08/03/2017, 08/02/2018   Influenza,inj,Quad PF,6+ Mos 08/09/2014   PFIZER(Purple Top)SARS-COV-2 Vaccination 01/12/2020, 02/06/2020, 08/18/2020   Pneumococcal Conjugate-13 08/09/2014   Pneumococcal Polysaccharide-23 10/20/2004, 08/04/2013   Td 12/19/1995, 06/18/2006   Tdap 09/22/2016   Zoster Recombinat (Shingrix)  06/10/2020, 09/12/2020   Zoster, Live 12/07/2007    TDAP status: Up to date  Flu Vaccine status: Due, Education has been provided regarding the importance of this vaccine. Advised may receive this vaccine at local pharmacy or Health Dept. Aware to provide a copy of the vaccination record if obtained from local pharmacy or Health Dept. Verbalized acceptance and understanding.  Pneumococcal vaccine status: Up to date  Covid-19 vaccine status: Completed vaccines  Qualifies for Shingles Vaccine? Yes   Zostavax completed Yes   Shingrix Completed?: Yes  Screening Tests Health Maintenance  Topic Date Due   COVID-19 Vaccine (4 - Pfizer series) 10/13/2020   INFLUENZA VACCINE  05/20/2022   Medicare Annual Wellness (AWV)  08/16/2022   TETANUS/TDAP  09/22/2026   Pneumonia Vaccine 4+ Years old  Completed   DEXA SCAN  Completed   Zoster Vaccines- Shingrix  Completed   HPV VACCINES  Aged Out   COLONOSCOPY (Pts 45-31yr Insurance coverage will need to be confirmed)  Discontinued    Health Maintenance  Health Maintenance Due  Topic Date Due   COVID-19 Vaccine (4 - Pfizer series) 10/13/2020   INFLUENZA VACCINE  05/20/2022   Medicare Annual Wellness (AWV)  08/16/2022    Colorectal cancer screening: No longer required.   Mammogram status: No longer required due to age.  Bone Density status: Completed 04/24/2020  Lung Cancer Screening: (Low Dose CT Chest recommended if Age 82-80years, 30 pack-year currently smoking OR have quit w/in 15years.) does not qualify.   Lung Cancer Screening Referral: no  Additional Screening:  Hepatitis C Screening: does not qualify;   Vision Screening: Recommended annual ophthalmology exams for early detection of glaucoma and other disorders of the eye. Is the patient up to date with their annual eye exam?  Yes  Who is the provider or what is the name of the office in which the patient attends annual eye exams? Dr. BTommy RainwaterIf pt is not established with a  provider, would they like to be referred to a provider to establish care? No .   Dental Screening: Recommended annual dental exams for proper oral hygiene  Community Resource Referral / Chronic Care Management: CRR required this visit?  No   CCM required this visit?  No      Plan:     I have personally reviewed and noted the following in the patient's chart:   Medical and social history Use of alcohol, tobacco or illicit drugs  Current medications and supplements including opioid prescriptions. Patient is not currently taking opioid prescriptions. Functional ability and status Nutritional status Physical activity Advanced directives List of other physicians Hospitalizations, surgeries, and ER visits in previous 12 months Vitals Screenings to include cognitive, depression, and falls Referrals and appointments  In addition, I have reviewed and discussed with patient certain preventive protocols, quality metrics, and best practice recommendations. A written personalized care plan for preventive services as well as general preventive health recommendations were provided to patient.     NKellie Simmering LPN   [phone]  Nurse Notes: none  Due to this being a virtual visit, the after visit summary with patients personalized plan was offered to patient via mail or my-chart.  to pick up at office at next visit

## 2022-09-04 ENCOUNTER — Other Ambulatory Visit: Payer: Self-pay | Admitting: Family Medicine

## 2022-09-04 DIAGNOSIS — I480 Paroxysmal atrial fibrillation: Secondary | ICD-10-CM

## 2022-09-10 NOTE — Progress Notes (Signed)
HPI: FU atrial fibrillation.  Patient admitted December 2021 with altered mental status.  MRI consistent with posterior circulation infarct.  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  Echocardiogram December 2021 showed normal LV function.  Patient noted to have paroxysmal atrial fibrillation during hospitalization and apixaban added.  Carotid Doppler September 2022 showed 1 to 39% bilateral stenosis.  Since last seen, the patient denies any dyspnea on exertion, orthopnea, PND, pedal edema, palpitations, syncope or chest pain.   Current Outpatient Medications  Medication Sig Dispense Refill   acetaminophen (TYLENOL) 500 MG tablet Take 500 mg by mouth every 6 (six) hours as needed.     atorvastatin (LIPITOR) 40 MG tablet TAKE 1 TABLET BY MOUTH EVERY DAY 90 tablet 3   Calcium Carbonate-Vitamin D (CALCIUM 600 + D PO) Take 1 tablet by mouth daily.     diclofenac Sodium (VOLTAREN) 1 % GEL Apply 4 g topically 4 (four) times daily. 100 g 0   diltiazem (CARDIZEM CD) 120 MG 24 hr capsule TAKE 1 CAPSULE DAILY 90 capsule 3   lidocaine (LIDODERM) 5 % Place 1 patch onto the skin daily. Remove & Discard patch within 12 hours or as directed by MD 30 patch 0   loratadine (CLARITIN) 10 MG tablet Take 10 mg by mouth daily.     Multiple Vitamins-Minerals (CENTRUM SILVER PO) Take 1 tablet by mouth daily.     rivaroxaban (XARELTO) 20 MG TABS tablet TAKE 1 TABLET BY MOUTH DAILY WITH SUPPER. 90 tablet 1   tiZANidine (ZANAFLEX) 4 MG tablet Take 0.5-1 tablets (2-4 mg total) by mouth every 12 (twelve) hours as needed for muscle spasms. Please establish with a new provider for further refills 60 tablet 1   traMADol (ULTRAM) 50 MG tablet TAKE 1 TABLET BY MOUTH AT BEDTIME AS NEEDED FOR PAIN 30 tablet 0   No current facility-administered medications for this visit.     Past Medical History:  Diagnosis Date   Allergic rhinitis    CVA (cerebral vascular  accident) ([place])    Hypertension    Migraine headache    Osteoporosis    PMS (premenstrual syndrome)    Rosacea     Past Surgical History:  Procedure Laterality Date   childbirthx2     COLONOSCOPY  06/2003    Social History   Socioeconomic History   Marital status: Widowed    Spouse name: Not on file   Number of children: 2   Years of education: Not on file   Highest education level: Not on file  Occupational History   Not on file  Tobacco Use   Smoking status: Former    Types: Cigarettes    Quit date: 10/20/1976    Years since quitting: 45.9   Smokeless tobacco: Never  Vaping Use   Vaping Use: Never used  Substance and Sexual Activity   Alcohol use: No   Drug use: No   Sexual activity: Not on file  Other Topics Concern   Not on file  Social History Narrative   Lives alone   Right handed   Drinks no caffeine daily   Social Determinants of Health   Financial Resource Strain: Low Risk  (08/18/2022)   Overall Financial Resource Strain (CARDIA)    Difficulty of Paying Living Expenses: Not hard at all  Food Insecurity: No Food Insecurity (08/18/2022)   Hunger Vital Sign    Worried About Charity fundraiser in  the Last Year: Never true    [place] in the Last Year: Never true  Transportation Needs: No Transportation Needs (08/18/2022)   PRAPARE - Hydrologist (Medical): No    Lack of Transportation (Non-Medical): No  Physical Activity: Inactive (08/18/2022)   Exercise Vital Sign    Days of Exercise per Week: 0 days    Minutes of Exercise per Session: 0 min  Stress: No Stress Concern Present (08/18/2022)   [place]    Feeling of Stress : Not at all  Social Connections: Moderately Isolated (08/16/2021)   Social Connection and Isolation Panel [NHANES]    Frequency of Communication with Friends and Family: Twice a week    Frequency of Social Gatherings with  Friends and Family: Twice a week    Attends Religious Services: More than 4 times per year    Active Member of [employer] or Organizations: No    Attends Archivist Meetings: Never    Marital Status: Widowed  Intimate Partner Violence: Not At Risk (08/16/2021)   Humiliation, Afraid, Rape, and Kick questionnaire    Fear of Current or Ex-Partner: No    Emotionally Abused: No    Physically Abused: No    Sexually Abused: No    Family History  Problem Relation Age of Onset   Colon polyps Sister    Colon polyps Brother    Breast cancer Maternal Aunt    CAD Mother    Colon cancer Neg Hx     ROS: no fevers or chills, productive cough, hemoptysis, dysphasia, odynophagia, melena, hematochezia, dysuria, hematuria, rash, seizure activity, orthopnea, PND, pedal edema, claudication. Remaining systems are negative.  Physical Exam: Well-developed well-nourished in no acute distress.  Skin is warm and dry.  HEENT is normal.  Neck is supple.  Chest is clear to auscultation with normal expansion.  Cardiovascular exam is irregular Abdominal exam nontender or distended. No masses palpated. Extremities show no edema. neuro grossly intact  A/P  1 permanent atrial fibrillation-continue Cardizem at present dose.  Continue Xarelto.  Check hemoglobin and renal function.  2 hypertension-patient's blood pressure is controlled.  Continue present medical regimen.  3 hyperlipidemia-continue statin.  Check lipids and liver.  Kirk Ruths, MD

## 2022-09-24 ENCOUNTER — Encounter: Payer: Self-pay | Admitting: Cardiology

## 2022-09-24 ENCOUNTER — Ambulatory Visit: Payer: Medicare Other | Attending: Cardiology | Admitting: Cardiology

## 2022-09-24 VITALS — BP 139/73 | HR 85 | Ht 61.0 in | Wt 143.1 lb

## 2022-09-24 DIAGNOSIS — I4821 Permanent atrial fibrillation: Secondary | ICD-10-CM | POA: Insufficient documentation

## 2022-09-24 DIAGNOSIS — E7800 Pure hypercholesterolemia, unspecified: Secondary | ICD-10-CM | POA: Insufficient documentation

## 2022-09-24 DIAGNOSIS — I10 Essential (primary) hypertension: Secondary | ICD-10-CM | POA: Insufficient documentation

## 2022-09-24 NOTE — Patient Instructions (Signed)
    Follow-Up: At [facility], you and your health needs are our priority.  As part of our continuing mission to provide you with exceptional heart care, we have created designated Provider Care Teams.  These Care Teams include your primary Cardiologist (physician) and Advanced Practice Providers (APPs -  Physician Assistants and Nurse Practitioners) who all work together to provide you with the care you need, when you need it.  We recommend signing up for the patient portal called "MyChart".  Sign up information is provided on this After Visit Summary.  MyChart is used to connect with patients for Virtual Visits (Telemedicine).  Patients are able to view lab/test results, encounter notes, upcoming appointments, etc.  Non-urgent messages can be sent to your provider as well.   To learn more about what you can do with MyChart, go to [email or web address].    Your next appointment:   12 month(s)  The format for your next appointment:   In Person  Provider:   Brian Crenshaw MD          

## 2022-09-25 ENCOUNTER — Encounter: Payer: Self-pay | Admitting: *Deleted

## 2022-09-25 LAB — COMPREHENSIVE METABOLIC PANEL
ALT: 26 IU/L (ref 0–32)
AST: 38 IU/L (ref 0–40)
Albumin/Globulin Ratio: 1.6 (ref 1.2–2.2)
Albumin: 4.5 g/dL (ref 3.7–4.7)
Alkaline Phosphatase: 113 IU/L (ref 44–121)
BUN/Creatinine Ratio: 21 (ref 12–28)
BUN: 15 mg/dL (ref 8–27)
Bilirubin Total: 0.8 mg/dL (ref 0.0–1.2)
CO2: 22 mmol/L (ref 20–29)
Calcium: 9.7 mg/dL (ref 8.7–10.3)
Chloride: 102 mmol/L (ref 96–106)
Creatinine, Ser: 0.71 mg/dL (ref 0.57–1.00)
Globulin, Total: 2.9 g/dL (ref 1.5–4.5)
Glucose: 93 mg/dL (ref 70–99)
Potassium: 4.9 mmol/L (ref 3.5–5.2)
Sodium: 141 mmol/L (ref 134–144)
Total Protein: 7.4 g/dL (ref 6.0–8.5)
eGFR: 85 mL/min/{1.73_m2} (ref >59)

## 2022-09-25 LAB — LIPID PANEL
Chol/HDL Ratio: 1.5 ratio (ref 0.0–4.4)
Cholesterol, Total: 138 mg/dL (ref 100–199)
HDL: 90 mg/dL (ref >39)
LDL Chol Calc (NIH): 37 mg/dL (ref 0–99)
Triglycerides: 50 mg/dL (ref 0–149)
VLDL Cholesterol Cal: 11 mg/dL (ref 5–40)

## 2022-09-25 LAB — CBC
Hematocrit: 43.6 % (ref 34.0–46.6)
Hemoglobin: 14.6 g/dL (ref 11.1–15.9)
MCH: 29.1 pg (ref 26.6–33.0)
MCHC: 33.5 g/dL (ref 31.5–35.7)
MCV: 87 fL (ref 79–97)
Platelets: 151 10*3/uL (ref 150–450)
RBC: 5.01 x10E6/uL (ref 3.77–5.28)
RDW: 13.8 % (ref 11.7–15.4)
WBC: 4.3 10*3/uL (ref 3.4–10.8)

## 2022-09-26 DIAGNOSIS — H04123 Dry eye syndrome of bilateral lacrimal glands: Secondary | ICD-10-CM | POA: Diagnosis not present

## 2022-09-26 DIAGNOSIS — Z961 Presence of intraocular lens: Secondary | ICD-10-CM | POA: Diagnosis not present

## 2022-09-26 DIAGNOSIS — H18413 Arcus senilis, bilateral: Secondary | ICD-10-CM | POA: Diagnosis not present

## 2022-09-26 DIAGNOSIS — H02831 Dermatochalasis of right upper eyelid: Secondary | ICD-10-CM | POA: Diagnosis not present

## 2022-10-12 ENCOUNTER — Other Ambulatory Visit: Payer: Self-pay | Admitting: Family Medicine

## 2022-12-06 ENCOUNTER — Other Ambulatory Visit: Payer: Self-pay | Admitting: Family Medicine

## 2022-12-06 DIAGNOSIS — I480 Paroxysmal atrial fibrillation: Secondary | ICD-10-CM

## 2023-01-07 ENCOUNTER — Other Ambulatory Visit: Payer: Self-pay | Admitting: Family Medicine

## 2023-01-23 ENCOUNTER — Telehealth: Payer: Self-pay

## 2023-01-23 NOTE — Progress Notes (Signed)
Patient ID: Tricia Newman, female   DOB: 10-18-1941, 82 y.o.   MRN: [id number]  Care Management & Coordination Services Pharmacy Team  Reason for Encounter: General adherence update   Contacted patient for general health update and medication adherence call.  Spoke with patient on 01/23/2023    What concerns do you have about your medications? Patient reports none  The patient denies side effects with their medications.   How often do you forget or accidentally miss a dose? Never  Do you use a pillbox? No  Are you having any problems getting your medications from your pharmacy? No  Has the cost of your medications been a concern? No  The patient has had an ED visit since last contact.   The patient reports the following problems with their health. Patient reports her only issue is her occasional neck and back pain she reports the last time woke her out of her sleep so she went to the hospital. She reports they advised therapy but told her it wouldn't help her so she has not gone.  Patient denies concerns or questions for Milas Kocher, PharmD at this time. Scheduled follow up for May  Counseled patient on: [place] job taking medications and Access to carecoordination team for any cost, medication or pharmacy concerns.   Chart Updates:  Recent office visits:  08/18/22 Barb Merino, LPN - Patient presented for Medicare Annual wellness exam. Patient voiced goal of watching diet. No medication changes.   Recent consult visits:  09/24/22 Lewayne Bunting, MD (Cardiology) - Patient presented for permanent atrial fibrillation and other concerns. No medication changes.   Hospital visits:  None in previous 6 months  Medications: Outpatient Encounter Medications as of 01/23/2023  Medication Sig   acetaminophen (TYLENOL) 500 MG tablet Take 500 mg by mouth every 6 (six) hours as needed.   atorvastatin (LIPITOR) 40 MG tablet TAKE 1 TABLET (40 MG TOTAL) BY MOUTH DAILY. SCHEDULE APPT FOR  FUTURE REFILLS   Calcium Carbonate-Vitamin D (CALCIUM 600 + D PO) Take 1 tablet by mouth daily.   diclofenac Sodium (VOLTAREN) 1 % GEL Apply 4 g topically 4 (four) times daily.   diltiazem (CARDIZEM CD) 120 MG 24 hr capsule TAKE 1 CAPSULE DAILY   lidocaine (LIDODERM) 5 % Place 1 patch onto the skin daily. Remove & Discard patch within 12 hours or as directed by MD   loratadine (CLARITIN) 10 MG tablet Take 10 mg by mouth daily.   Multiple Vitamins-Minerals (CENTRUM SILVER PO) Take 1 tablet by mouth daily.   rivaroxaban (XARELTO) 20 MG TABS tablet TAKE 1 TABLET BY MOUTH DAILY WITH SUPPER.   tiZANidine (ZANAFLEX) 4 MG tablet Take 0.5-1 tablets (2-4 mg total) by mouth every 12 (twelve) hours as needed for muscle spasms. Please establish with a new provider for further refills   traMADol (ULTRAM) 50 MG tablet TAKE 1 TABLET BY MOUTH AT BEDTIME AS NEEDED FOR PAIN   No facility-administered encounter medications on file as of 01/23/2023.    Recent vitals BP Readings from Last 3 Encounters:  09/24/22 139/73  07/18/22 (!) 143/86  03/26/22 (!) 146/88   Pulse Readings from Last 3 Encounters:  09/24/22 85  07/18/22 78  03/26/22 75   Wt Readings from Last 3 Encounters:  09/24/22 143 lb 1.9 oz (64.9 kg)  08/18/22 138 lb (62.6 kg)  07/18/22 138 lb (62.6 kg)   BMI Readings from Last 3 Encounters:  09/24/22 27.04 kg/m  08/18/22 25.24 kg/m  07/18/22 26.07  kg/m    Recent lab results    Component Value Date/Time   NA 141 09/24/2022 0816   K 4.9 09/24/2022 0816   CL 102 09/24/2022 0816   CO2 22 09/24/2022 0816   GLUCOSE 93 09/24/2022 0816   GLUCOSE 90 11/26/2021 0847   BUN 15 09/24/2022 0816   CREATININE 0.71 09/24/2022 0816   CREATININE 0.63 11/02/2020 1525   CALCIUM 9.7 09/24/2022 0816    Lab Results  Component Value Date   CREATININE 0.71 09/24/2022   GFR 85.67 11/26/2021   EGFR 85 09/24/2022   GFRNONAA >60 10/05/2020   GFRAA 101 08/01/2020   Lab Results  Component Value  Date/Time   HGBA1C 5.6 06/12/2021 01:29 PM   HGBA1C 4.9 10/05/2020 11:17 AM    Lab Results  Component Value Date   CHOL 138 09/24/2022   HDL 90 09/24/2022   LDLCALC 37 09/24/2022   LDLDIRECT 105.7 07/30/2011   TRIG 50 09/24/2022   CHOLHDL 1.5 09/24/2022    Care Gaps: COVID Booster - Overdue AWV - 08/18/22  Star Rating Drugs:  Atorvastatin (Lipitor) 40 mg - Last filled 01/07/23 90 DS at [facility]     Tricia Newman CMA Clinical Pharmacist Assistant [phone]

## 2023-02-03 ENCOUNTER — Other Ambulatory Visit: Payer: Self-pay | Admitting: Family Medicine

## 2023-03-03 ENCOUNTER — Other Ambulatory Visit: Payer: Self-pay | Admitting: Family Medicine

## 2023-03-03 DIAGNOSIS — I480 Paroxysmal atrial fibrillation: Secondary | ICD-10-CM

## 2023-03-07 ENCOUNTER — Other Ambulatory Visit: Payer: Self-pay | Admitting: Family Medicine

## 2023-03-07 DIAGNOSIS — I480 Paroxysmal atrial fibrillation: Secondary | ICD-10-CM

## 2023-03-17 ENCOUNTER — Telehealth: Payer: Self-pay

## 2023-03-17 NOTE — Progress Notes (Unsigned)
Care Management & Coordination Services Pharmacy Note  03/18/2023 Name:  Tricia Newman MRN:  [id number] DOB:  [birth year]  Summary: BP at goal <130/80 Denies any signs of bleed  Recommendations/Changes made from today's visit: -Counseled to continue to monitor BP/HR  -Counseled to watch for signs of a major bleed  Follow up plan: BP call in 6 months Pharmacist visit in 1 year   Subjective: Tricia Newman is an 82 y.o. year old female who is a primary patient of [place], Timoteo Expose, MD.  The care coordination team was consulted for assistance with disease management and care coordination needs.    Engaged with patient by telephone for follow up visit.  Recent office visits: 08/18/22 Barb Merino, LPN - Patient presented for Medicare Annual wellness exam. Patient voiced goal of watching diet. No medication changes.   Recent consult visits: 09/24/22 Lewayne Bunting, MD (Cardiology) - Patient presented for permanent atrial fibrillation and other concerns. No medication changes.   Hospital visits: None in previous 6 months   Objective:  Lab Results  Component Value Date   CREATININE 0.71 09/24/2022   BUN 15 09/24/2022   GFR 85.67 11/26/2021   EGFR 85 09/24/2022   GFRNONAA >60 10/05/2020   GFRAA 101 08/01/2020   NA 141 09/24/2022   K 4.9 09/24/2022   CALCIUM 9.7 09/24/2022   CO2 22 09/24/2022   GLUCOSE 93 09/24/2022    Lab Results  Component Value Date/Time   HGBA1C 5.6 06/12/2021 01:29 PM   HGBA1C 4.9 10/05/2020 11:17 AM   GFR 85.67 11/26/2021 08:47 AM   GFR 81.73 05/03/2021 11:49 AM    Last diabetic Eye exam: No results found for: "HMDIABEYEEXA"  Last diabetic Foot exam: No results found for: "HMDIABFOOTEX"   Lab Results  Component Value Date   CHOL 138 09/24/2022   HDL 90 09/24/2022   LDLCALC 37 09/24/2022   LDLDIRECT 105.7 07/30/2011   TRIG 50 09/24/2022   CHOLHDL 1.5 09/24/2022       Latest Ref Rng & Units 09/24/2022    8:16 AM 11/26/2021     8:47 AM 05/03/2021   11:49 AM  Hepatic Function  Total Protein 6.0 - 8.5 g/dL 7.4  7.5  7.7   Albumin 3.7 - 4.7 g/dL 4.5  4.2  4.5   AST 0 - 40 IU/L 38  34  43   ALT 0 - 32 IU/L 26  30  43   Alk Phosphatase 44 - 121 IU/L 113  108  131   Total Bilirubin 0.0 - 1.2 mg/dL 0.8  1.0  0.8   Bilirubin, Direct 0.0 - 0.3 mg/dL  0.2      Lab Results  Component Value Date/Time   TSH 2.970 12/04/2020 09:32 AM   TSH 4.47 10/06/2017 09:11 AM       Latest Ref Rng & Units 09/24/2022    8:16 AM 11/26/2021    8:47 AM 11/02/2020    3:25 PM  CBC  WBC 3.4 - 10.8 x10E3/uL 4.3  4.3  4.3   Hemoglobin 11.1 - 15.9 g/dL 40.9  81.1  91.4   Hematocrit 34.0 - 46.6 % 43.6  39.6  40.2   Platelets 150 - 450 x10E3/uL 151  134.0  162     Lab Results  Component Value Date/Time   VITAMINB12 778 11/26/2021 08:47 AM    Clinical ASCVD: Yes  The ASCVD Risk score (Arnett DK, et al., 2019) failed to calculate for the following reasons:  The 2019 ASCVD risk score is only valid for ages 66 to 9   The patient has a prior MI or stroke diagnosis       08/18/2022   10:50 AM 11/26/2021    8:20 AM 08/16/2021    8:28 AM  Depression screen PHQ 2/9  Decreased Interest 0 0 0  Down, Depressed, Hopeless 0 0 0  PHQ - 2 Score 0 0 0     Social History   Tobacco Use  Smoking Status Former   Types: Cigarettes   Quit date: 10/20/1976   Years since quitting: 46.4  Smokeless Tobacco Never   BP Readings from Last 3 Encounters:  09/24/22 139/73  07/18/22 (!) 143/86  03/26/22 (!) 146/88   Pulse Readings from Last 3 Encounters:  09/24/22 85  07/18/22 78  03/26/22 75   Wt Readings from Last 3 Encounters:  09/24/22 143 lb 1.9 oz (64.9 kg)  08/18/22 138 lb (62.6 kg)  07/18/22 138 lb (62.6 kg)   BMI Readings from Last 3 Encounters:  09/24/22 27.04 kg/m  08/18/22 25.24 kg/m  07/18/22 26.07 kg/m    No Known Allergies  Medications Reviewed Today     Reviewed by Sherrill Raring, RPH (Pharmacist) on 03/18/23 at  1416  Med List Status: <None>   Medication Order Taking? Sig Documenting Provider Last Dose Status Informant  acetaminophen (TYLENOL) 500 MG tablet [id number] No Take 500 mg by mouth every 6 (six) hours as needed. [provider] Taking Active   atorvastatin (LIPITOR) 40 MG tablet [id number]  TAKE 1 TABLET (40 MG TOTAL) BY MOUTH DAILY. SCHEDULE APPT FOR FUTURE REFILLS [place], [name], MD  Active   Calcium Carbonate-Vitamin D (CALCIUM 600 + D PO) [id number] No Take 1 tablet by mouth daily. [provider] Taking Active Self  diclofenac Sodium (VOLTAREN) 1 % GEL [id number] No Apply 4 g topically 4 (four) times daily. Palumbo, April, MD Taking Active   diltiazem (CARDIZEM CD) 120 MG 24 hr capsule [id number] No TAKE 1 CAPSULE DAILY Crenshaw, Madolyn Frieze, MD Taking Active   lidocaine (LIDODERM) 5 % [id number] No Place 1 patch onto the skin daily. Remove & Discard patch within 12 hours or as directed by MD Palumbo, April, MD Taking Active   loratadine (CLARITIN) 10 MG tablet [id number] No Take 10 mg by mouth daily. [provider] Taking Active Self  Multiple Vitamins-Minerals (CENTRUM SILVER PO) [id number] No Take 1 tablet by mouth daily. [provider] Taking Active Self  rivaroxaban (XARELTO) 20 MG TABS tablet [id number]  Take 1 tablet (20 mg total) by mouth daily with supper. Due for follow up, call [phone] to schedule. [place], [name], MD  Active   tiZANidine (ZANAFLEX) 4 MG tablet [id number] No Take 0.5-1 tablets (2-4 mg total) by mouth every 12 (twelve) hours as needed for muscle spasms. Please establish with a new provider for further refills [place], [name], MD Taking Active Self  traMADol (ULTRAM) 50 MG tablet [id number] No TAKE 1 TABLET BY MOUTH AT BEDTIME AS NEEDED FOR PAIN [place], [name], MD Taking Active             SDOH:  (Social Determinants of Health) assessments and interventions performed: Yes SDOH Interventions    Flowsheet Row Care Coordination from  03/18/2023 in [facility] Clinical Support from 08/18/2022 in [facility] [place] Clinical Support from 08/16/2021 in [facility] [place] Chronic Care Management from 05/03/2021 in [facility]  [place]  SDOH Interventions      Food Insecurity Interventions Intervention Not Indicated Intervention Not Indicated Intervention Not Indicated --  Housing Interventions Intervention Not Indicated -- Intervention Not Indicated --  Transportation Interventions -- Intervention Not Indicated Intervention Not Indicated Intervention Not Indicated  Financial Strain Interventions -- Intervention Not Indicated Intervention Not Indicated Intervention Not Indicated  Physical Activity Interventions -- Patient Refused, Other (Comments) Intervention Not Indicated --  Stress Interventions -- Intervention Not Indicated Intervention Not Indicated --  Social Connections Interventions -- -- Intervention Not Indicated --       Medication Assistance: None required.  Patient affirms current coverage meets needs.  Medication Access: Name and location of [facility] pharmacy:  [phone] [place], [place] - [address] [place] [address] Phone: [phone] Fax: [phone]  [facility] [place], [place] - [address] [facility] AT Portal to Registered [facility] [address] [place] [address] Phone: [phone] Fax: [phone]  Within the past 30 days, how often has patient missed a dose of medication? None Is a pillbox or other method used to improve adherence? Yes  Factors that may affect medication adherence? no barriers identified Are meds synced by current pharmacy? No  Are meds delivered by current pharmacy? No  Does patient experience delays in picking up medications due to transportation concerns? No   Compliance/Adherence/Medication fill  history: Care Gaps: COVID Booster - Overdue AWV - 08/18/22  Star-Rating Drugs: Atorvastatin 40 mg - Last filled 01/07/23 [facility] to verify [facility] closed for lunch   Assessment/Plan Hypertension (BP goal <140/90) -Controlled -Current treatment: Diltiazem CD 120mg  1 qd Appropriate, Effective, Safe, Accessible -Medications previously tried: Benazepril, Lisinopril, Metoprolol -Current home readings: 130s/80s -Current dietary habits: mindful of salt intake -Current exercise habits: not discussed -Denies hypotensive/hypertensive symptoms -Educated on BP goals and benefits of medications for prevention of heart attack, stroke and kidney damage; Daily salt intake goal < 2300 mg; Exercise goal of 150 minutes per week; Importance of home blood pressure monitoring; Proper BP monitoring technique; Symptoms of hypotension and importance of maintaining adequate hydration; -Counseled to monitor BP at home 2-3/xweek, document, and provide log at future appointments -Counseled on diet and exercise extensively Recommended to continue current medication  Atrial Fibrillation (Goal: prevent stroke and major bleeding) -Controlled -CHADSVASC: 7 -Current treatment: Xarelto 20mg  1 qd Appropriate, Effective, Safe, Accessible Diltiazem CD 120mg  1 qd Appropriate, Effective, Safe, Accessible -Medications previously tried: Metoprolol -Home BP and HR readings: see above  -Counseled on increased risk of stroke due to Afib and benefits of anticoagulation for stroke prevention; importance of adherence to anticoagulant exactly as prescribed; bleeding risk associated with Xarelto and importance of self-monitoring for signs/symptoms of bleeding; seeking medical attention after a head injury or if there is blood in the urine/stool; -Recommended to continue current medication  Sherrill Raring Clinical Pharmacist [phone]

## 2023-03-17 NOTE — Progress Notes (Signed)
Patient ID: Francelle Shiau, female   DOB: [birth year], 82 y.o.   MRN: [id number]  Care Management & Coordination Services Pharmacy Team  Reason for Encounter: Appointment Reminder  Contacted patient to confirm telephone appointment with Milas Kocher, PharmD on 03/18/23 at 2. Unsuccessful outreach. Left voicemail for patient to return call.   Star Rating Drugs:  Atorvastatin 40 mg - Last filled 01/07/23 [facility] to verify [facility] closed for lunch    Care Gaps: COVID Booster - Overdue AWV - 08/18/22   Pamala Duffel CMA Clinical Pharmacist Assistant [phone]

## 2023-03-18 ENCOUNTER — Ambulatory Visit: Payer: Medicare Other

## 2023-03-31 ENCOUNTER — Other Ambulatory Visit: Payer: Self-pay | Admitting: Family Medicine

## 2023-03-31 DIAGNOSIS — I480 Paroxysmal atrial fibrillation: Secondary | ICD-10-CM

## 2023-04-09 ENCOUNTER — Other Ambulatory Visit: Payer: Self-pay | Admitting: Family Medicine

## 2023-04-15 ENCOUNTER — Other Ambulatory Visit: Payer: Self-pay | Admitting: Family Medicine

## 2023-04-15 DIAGNOSIS — I480 Paroxysmal atrial fibrillation: Secondary | ICD-10-CM

## 2023-04-15 NOTE — Progress Notes (Unsigned)
HPI: [name] is a 82 y.o. female, who is here today for chronic disease management. Last seen on 11/26/21. No new problems since her last visit.  Hypertension and atrial fibrillation: She is not longer on Eliquis, now she is on Xarelto, 20 mg daily, and continues to take Diltiazem CD 120 mg daily. She admits to not checking her blood pressure at home regularly.   She last saw her cardiologist, Dr. Jens Som, in December/2013 and follows annually. She also saw her eye care provider in 09/2022.  Negative for unusual or severe headache, visual changes, exertional chest pain, dyspnea,  focal weakness, or edema. She experiences occasional palpitations, particularly when engaging in excessive exercise.   She exercises daily on a stationary bike and maintains a healthy diet, cooking at home and consuming plenty of vegetables.  Additionally,she takes a daily multivitamin.  Lab Results  Component Value Date   CREATININE 0.71 09/24/2022   BUN 15 09/24/2022   NA 141 09/24/2022   K 4.9 09/24/2022   CL 102 09/24/2022   CO2 22 09/24/2022   Hyperlipidemia:She is also taking atorvastatin, 40 mg daily. She is tolerating medication well, no side effects reported. Aortic atherosclerosis seen on imaging before.  Lab Results  Component Value Date   CHOL 138 09/24/2022   HDL 90 09/24/2022   LDLCALC 37 09/24/2022   LDLDIRECT 105.7 07/30/2011   TRIG 50 09/24/2022   CHOLHDL 1.5 09/24/2022   Thrombocytopenia: Problem has been intermittent. She has not noted nosebleed, gum bleed, bloody stool, melena's, gross hematuria.  Lab Results  Component Value Date   WBC 4.3 09/24/2022   HGB 14.6 09/24/2022   HCT 43.6 09/24/2022   MCV 87 09/24/2022   PLT 151 09/24/2022   Chronic lower back pain: She is on Zanaflex 4 mg 0.5-2 tab bid as needed and tramadol 25-50 mg daily as needed, states that 10 tablets lasting her the entire year.  Back pain is exacerbated by certain activities and  alleviated by rest. Usually pain is not radiated but has occasional pretibial tingling sensation. Negative for saddle anesthesia or changes in bowel/bladder function.  She also reports no worsening of her back pain and is careful not to overexert herself during physical activities.  Review of Systems  Constitutional:  Negative for activity change, appetite change, chills and fever.  HENT:  Negative for mouth sores and sore throat.   Respiratory:  Negative for cough and wheezing.   Gastrointestinal:  Negative for abdominal pain, nausea and vomiting.  Genitourinary:  Negative for decreased urine volume, dysuria and hematuria.  Skin:  Negative for rash.  Neurological:  Negative for syncope and facial asymmetry.  Psychiatric/Behavioral:  Negative for confusion. The patient is not nervous/anxious.   See other pertinent positives and negatives in HPI.  Current Outpatient Medications on File Prior to Visit  Medication Sig Dispense Refill   acetaminophen (TYLENOL) 500 MG tablet Take 500 mg by mouth every 6 (six) hours as needed.     atorvastatin (LIPITOR) 40 MG tablet TAKE 1 TABLET (40 MG TOTAL) BY MOUTH DAILY. SCHEDULE APPT FOR FUTURE REFILLS 30 tablet 0   Calcium Carbonate-Vitamin D (CALCIUM 600 + D PO) Take 1 tablet by mouth daily.     diltiazem (CARDIZEM CD) 120 MG 24 hr capsule TAKE 1 CAPSULE DAILY 90 capsule 3   loratadine (CLARITIN) 10 MG tablet Take 10 mg by mouth daily.     Multiple Vitamins-Minerals (CENTRUM SILVER PO) Take 1 tablet by mouth daily.  No current facility-administered medications on file prior to visit.   Past Medical History:  Diagnosis Date   Allergic rhinitis    CVA (cerebral vascular accident) (HCC)    Hypertension    Migraine headache    Osteoporosis    PMS (premenstrual syndrome)    Rosacea    No Known Allergies  Social History   Socioeconomic History   Marital status: Widowed    Spouse name: Not on file   Number of children: 2   Years of  education: Not on file   Highest education level: Not on file  Occupational History   Not on file  Tobacco Use   Smoking status: Former    Types: Cigarettes    Quit date: 10/20/1976    Years since quitting: 46.5   Smokeless tobacco: Never  Vaping Use   Vaping Use: Never used  Substance and Sexual Activity   Alcohol use: No   Drug use: No   Sexual activity: Not on file  Other Topics Concern   Not on file  Social History Narrative   Lives alone   Right handed   Drinks no caffeine daily   Social Determinants of Health   Financial Resource Strain: Low Risk  (08/18/2022)   Overall Financial Resource Strain (CARDIA)    Difficulty of Paying Living Expenses: Not hard at all  Food Insecurity: No Food Insecurity (03/18/2023)   Hunger Vital Sign    Worried About Running Out of Food in the Last Year: Never true    Ran Out of Food in the Last Year: Never true  Transportation Needs: No Transportation Needs (08/18/2022)   PRAPARE - Administrator, Civil Service (Medical): No    Lack of Transportation (Non-Medical): No  Physical Activity: Inactive (08/18/2022)   Exercise Vital Sign    Days of Exercise per Week: 0 days    Minutes of Exercise per Session: 0 min  Stress: No Stress Concern Present (08/18/2022)   [employer] of Occupational Health - Occupational Stress Questionnaire    Feeling of Stress : Not at all  Social Connections: Moderately Isolated (08/16/2021)   Social Connection and Isolation Panel [NHANES]    Frequency of Communication with Friends and Family: Twice a week    Frequency of Social Gatherings with Friends and Family: Twice a week    Attends Religious Services: More than 4 times per year    Active Member of [employer] or Organizations: No    Attends Banker Meetings: Never    Marital Status: Widowed   Vitals:   04/17/23 0831  BP: 130/80  Pulse: 82  Resp: 16  SpO2: 97%   Body mass index is 29.52 kg/m.  Physical Exam Vitals and  nursing note reviewed.  Constitutional:      General: She is not in acute distress.    Appearance: She is well-developed.  HENT:     Head: Normocephalic and atraumatic.     Mouth/Throat:     Mouth: Mucous membranes are moist.     Pharynx: Oropharynx is clear.  Eyes:     Conjunctiva/sclera: Conjunctivae normal.  Cardiovascular:     Rate and Rhythm: Normal rate. Rhythm irregular.     Pulses:          Dorsalis pedis pulses are 2+ on the right side and 2+ on the left side.     Heart sounds: No murmur heard. Pulmonary:     Effort: Pulmonary effort is normal. No respiratory distress.  Breath sounds: Normal breath sounds.  Abdominal:     Palpations: Abdomen is soft. There is no hepatomegaly or mass.     Tenderness: There is no abdominal tenderness.  Lymphadenopathy:     Cervical: No cervical adenopathy.  Skin:    General: Skin is warm.     Findings: No erythema or rash.  Neurological:     General: No focal deficit present.     Mental Status: She is alert and oriented to person, place, and time.     Cranial Nerves: No cranial nerve deficit.     Gait: Gait normal.  Psychiatric:        Mood and Affect: Mood and affect normal.   ASSESSMENT AND PLAN:  [name] was seen today for medical management of chronic issues.  Diagnoses and all orders for this visit:  Chronic bilateral low back pain with bilateral sciatica Assessment & Plan: Problem has been stable. Continue tramadol 50 mg 1/2 to 1 tablet daily as needed, usually 10 to 20 tablets last a year. We discussed some side effects. Continue Zanaflex 4 mg 1/2 to 1 tablet daily as needed. Fall precautions discussed. Follow-up in 1 year, before if needed.  Orders: -     traMADol HCl; Take 0.5-1 tablets (25-50 mg total) by mouth daily as needed. TAKE 1 TABLET BY MOUTH AT BEDTIME AS NEEDED FOR PAIN  Dispense: 20 tablet; Refill: 0 -     tiZANidine HCl; Take 0.5-1 tablets (2-4 mg total) by mouth 2 (two) times daily as needed  for muscle spasms. Please establish with a new provider for further refills  Dispense: 60 tablet; Refill: 1  Paroxysmal atrial fibrillation (HCC) Assessment & Plan: Not rhythm controlled, rate is appropriate. Currently on Xarelto 20 mg daily and diltiazem 120 mg daily. Follows with cardiology regularly.  Orders: -     Rivaroxaban; Take 1 tablet (20 mg total) by mouth daily with supper.  Dispense: 90 tablet; Refill: 3  Essential hypertension Assessment & Plan: BP adequately controlled. Continue diltiazem 120 mg daily. Last eye exam in 09/2022. Recommend monitoring BP 1-2 times per month. She follows with cardiologist annually, next appointment around 09/2023.   Hyperlipidemia, unspecified hyperlipidemia type Assessment & Plan: Last FLP in 09/2022, numbers are at goal. Continue atorvastatin 40 mg daily and low-fat diet.   Atherosclerosis of aorta [facility]) Assessment & Plan: Seen on neck CTA in 09/2020. Currently on atorvastatin 40 mg daily and on chronic anticoagulation. Last LDL 37 in 09/2022.   Thrombocytopenia (HCC) Assessment & Plan: Problem has been mild and intermittent, normal in 09/2022. Further recommendation will be given according with CBC results.   Chronic pain disorder Assessment & Plan: On tramadol 50 mg 1/2 to 1 tablet daily as needed. One prescription for 10 to 20 tablets last 1 year. PMP reviewed. We discussed side effects of medications. As far as problem is stable and she does not require more tramadol refills, annual follow-up is appropriate.   Return in about 1 year (around 04/16/2024) for chronic problems.  Bo Teicher G. [place], MD  [facility]. [place] office.

## 2023-04-17 ENCOUNTER — Encounter: Payer: Self-pay | Admitting: Family Medicine

## 2023-04-17 ENCOUNTER — Ambulatory Visit (INDEPENDENT_AMBULATORY_CARE_PROVIDER_SITE_OTHER): Payer: Medicare Other | Admitting: Family Medicine

## 2023-04-17 VITALS — BP 130/80 | HR 82 | Resp 16 | Ht 61.0 in | Wt 156.2 lb

## 2023-04-17 DIAGNOSIS — I700 Atherosclerosis of aorta: Secondary | ICD-10-CM

## 2023-04-17 DIAGNOSIS — I480 Paroxysmal atrial fibrillation: Secondary | ICD-10-CM

## 2023-04-17 DIAGNOSIS — G894 Chronic pain syndrome: Secondary | ICD-10-CM | POA: Diagnosis not present

## 2023-04-17 DIAGNOSIS — D696 Thrombocytopenia, unspecified: Secondary | ICD-10-CM

## 2023-04-17 DIAGNOSIS — I10 Essential (primary) hypertension: Secondary | ICD-10-CM

## 2023-04-17 DIAGNOSIS — M5442 Lumbago with sciatica, left side: Secondary | ICD-10-CM | POA: Diagnosis not present

## 2023-04-17 DIAGNOSIS — M5441 Lumbago with sciatica, right side: Secondary | ICD-10-CM | POA: Diagnosis not present

## 2023-04-17 DIAGNOSIS — E785 Hyperlipidemia, unspecified: Secondary | ICD-10-CM | POA: Diagnosis not present

## 2023-04-17 DIAGNOSIS — G8929 Other chronic pain: Secondary | ICD-10-CM

## 2023-04-17 MED ORDER — TRAMADOL HCL 50 MG PO TABS: 25.0000 mg | ORAL_TABLET | Freq: Every day | ORAL | 0 refills | Status: AC | PRN

## 2023-04-17 MED ORDER — TIZANIDINE HCL 4 MG PO TABS: 2.0000 mg | ORAL_TABLET | Freq: Two times a day (BID) | ORAL | 1 refills | Status: DC | PRN

## 2023-04-17 MED ORDER — RIVAROXABAN 20 MG PO TABS: 20.0000 mg | ORAL_TABLET | Freq: Every day | ORAL | 3 refills | Status: DC

## 2023-04-17 NOTE — Assessment & Plan Note (Addendum)
Problem has been stable. Continue tramadol 50 mg 1/2 to 1 tablet daily as needed, usually 10 to 20 tablets last a year. We discussed some side effects. Continue Zanaflex 4 mg 1/2 to 1 tablet daily as needed. Fall precautions discussed. Follow-up in 1 year, before if needed.

## 2023-04-17 NOTE — Assessment & Plan Note (Signed)
On tramadol 50 mg 1/2 to 1 tablet daily as needed. One prescription for 10 to 20 tablets last 1 year. PMP reviewed. We discussed side effects of medications. As far as problem is stable and she does not require more tramadol refills, annual follow-up is appropriate.

## 2023-04-17 NOTE — Assessment & Plan Note (Signed)
Problem has been mild and intermittent, normal in 09/2022. Further recommendation will be given according with CBC results.

## 2023-04-17 NOTE — Assessment & Plan Note (Signed)
Last FLP in 09/2022, numbers are at goal. Continue atorvastatin 40 mg daily and low-fat diet.

## 2023-04-17 NOTE — Assessment & Plan Note (Addendum)
BP adequately controlled. Continue diltiazem 120 mg daily. Last eye exam in 09/2022. Recommend monitoring BP 1-2 times per month. She follows with cardiologist annually, next appointment around 09/2023.

## 2023-04-17 NOTE — Patient Instructions (Addendum)
A few things to remember from today's visit:  Paroxysmal atrial fibrillation (HCC) - Plan: rivaroxaban (XARELTO) 20 MG TABS tablet  Essential hypertension  Hyperlipidemia, unspecified hyperlipidemia type  Atherosclerosis of aorta (HCC), Chronic  Thrombocytopenia (HCC), Chronic  Chronic bilateral low back pain with bilateral sciatica - Plan: traMADol (ULTRAM) 50 MG tablet, tiZANidine (ZANAFLEX) 4 MG tablet  Chronic pain disorder  No changes today. You are due for appt with cardiologist in 09/2023. I will see you back in July next year, before if needed. Caution with falls.  If you need refills for medications you take chronically, please call your pharmacy. Do not use My Chart to request refills or for acute issues that need immediate attention. If you send a my chart message, it may take a few days to be addressed, specially if I am not in the office.  Please be sure medication list is accurate. If a new problem present, please set up appointment sooner than planned today.

## 2023-04-17 NOTE — Assessment & Plan Note (Addendum)
Not rhythm controlled, rate is appropriate. Currently on Xarelto 20 mg daily and diltiazem 120 mg daily. Follows with cardiology regularly.

## 2023-04-17 NOTE — Assessment & Plan Note (Signed)
Seen on neck CTA in 09/2020. Currently on atorvastatin 40 mg daily and on chronic anticoagulation. Last LDL 37 in 09/2022.

## 2023-05-14 ENCOUNTER — Other Ambulatory Visit: Payer: Self-pay | Admitting: Family Medicine

## 2023-05-22 ENCOUNTER — Other Ambulatory Visit: Payer: Self-pay | Admitting: Cardiology

## 2023-05-22 DIAGNOSIS — I480 Paroxysmal atrial fibrillation: Secondary | ICD-10-CM

## 2023-05-22 DIAGNOSIS — I10 Essential (primary) hypertension: Secondary | ICD-10-CM

## 2023-05-22 MED ORDER — DILTIAZEM HCL ER COATED BEADS 120 MG PO CP24: 120.0000 mg | ORAL_CAPSULE | Freq: Every day | ORAL | 1 refills | Status: DC

## 2023-06-10 ENCOUNTER — Other Ambulatory Visit: Payer: Self-pay | Admitting: Family Medicine

## 2023-06-10 DIAGNOSIS — G8929 Other chronic pain: Secondary | ICD-10-CM

## 2023-08-20 ENCOUNTER — Encounter: Payer: Self-pay | Admitting: Family Medicine

## 2023-08-20 ENCOUNTER — Ambulatory Visit: Payer: Medicare Other | Admitting: Family Medicine

## 2023-08-20 VITALS — Wt 160.0 lb

## 2023-08-20 DIAGNOSIS — Z0000 Encounter for general adult medical examination without abnormal findings: Secondary | ICD-10-CM

## 2023-08-20 NOTE — Patient Instructions (Addendum)
I really enjoyed getting to talk with you today! I am available on Tuesdays and Thursdays for virtual visits if you have any questions or concerns, or if I can be of any further assistance.   CHECKLIST FROM ANNUAL WELLNESS VISIT:  -Follow up (please call to schedule if not scheduled after visit):   -yearly for annual wellness visit with primary care office  -schedule your yearly visit with your doctor  Here is a list of your preventive care/health maintenance measures and the plan for each if any are due:  PLAN For any measures below that may be due:  -if you want to do the covid and flu vaccines - you can get them at the pharmacy.   Health Maintenance  Topic Date Due   COVID-19 Vaccine (4 - 2023-24 season) 09/05/2023 (Originally 06/21/2023)   INFLUENZA VACCINE  01/18/2024 (Originally 05/21/2023)   Medicare Annual Wellness (AWV)  08/19/2024   DTaP/Tdap/Td (4 - Td or Tdap) 09/22/2026   Pneumonia Vaccine 76+ Years old  Completed   DEXA SCAN  Completed   Zoster Vaccines- Shingrix  Completed   HPV VACCINES  Aged Out   Colonoscopy  Discontinued    -See a dentist at least yearly  -Get your eyes checked and then per your eye specialist's recommendations  -Other issues addressed today:   -I have included below further information regarding a healthy whole foods based diet, physical activity guidelines for adults, stress management and opportunities for social connections. I hope you find this information useful.   -----------------------------------------------------------------------------------------------------------------------------------------------------------------------------------------------------------------------------------------------------------  NUTRITION: -eat real food: lots of colorful vegetables (half the plate) and fruits -5-7 servings of vegetables and fruits per day (fresh or steamed is best), exp. 2 servings of vegetables with lunch and dinner and 2 servings of  fruit per day. Berries and greens such as kale and collards are great choices.  -consume on a regular basis: whole grains (make sure first ingredient on label contains the word "whole"), fresh fruits, fish, nuts, seeds, healthy oils (such as olive oil, avocado oil, grape seed oil) -may eat small amounts of dairy and lean meat on occasion, but avoid processed meats such as ham, bacon, lunch meat, etc. -drink water -try to avoid fast food and pre-packaged foods, processed meat -most experts advise limiting sodium to < 2300mg  per day, should limit further is any chronic conditions such as high blood pressure, heart disease, diabetes, etc. The American Heart Association advised that < 1500mg  is is ideal -try to avoid foods that contain any ingredients with names you do not recognize  -try to avoid sugar/sweets (except for the natural sugar that occurs in fresh fruit) -try to avoid sweet drinks -try to avoid white rice, white bread, pasta (unless whole grain), white or yellow potatoes  EXERCISE GUIDELINES FOR ADULTS: -if you wish to increase your physical activity, do so gradually and with the approval of your doctor -STOP and seek medical care immediately if you have any chest pain, chest discomfort or trouble breathing when starting or increasing exercise  -move and stretch your body, legs, feet and arms when sitting for long periods -Physical activity guidelines for optimal health in adults: -least 150 minutes per week of aerobic exercise (can talk, but not sing) once approved by your doctor, 20-30 minutes of sustained activity or two 10 minute episodes of sustained activity every day.  -resistance training at least 2 days per week if approved by your doctor -balance exercises 3+ days per week:   Stand somewhere where you have  something sturdy to hold onto if you lose balance.    1) lift up on toes, start with 5x per day and work up to 20x   2) stand and lift on leg straight out to the side so  that foot is a few inches of the floor, start with 5x each side and work up to 20x each side   3) stand on one foot, start with 5 seconds each side and work up to 20 seconds on each side  If you need ideas or help with getting more active:  -Silver sneakers https://tools.silversneakers.com  -Walk with a Doc: http://www.duncan-williams.com/  -try to include resistance (weight lifting/strength building) and balance exercises twice per week: or the following link for ideas: http://castillo-powell.com/  BuyDucts.dk  STRESS MANAGEMENT: -can try meditating, or just sitting quietly with deep breathing while intentionally relaxing all parts of your body for 5 minutes daily -if you need further help with stress, anxiety or depression please follow up with your primary doctor or contact the wonderful folks at [facility]: [phone]  SOCIAL CONNECTIONS: -options in [place] if you wish to engage in more social and exercise related activities:  -Silver sneakers https://tools.silversneakers.com  -Walk with a Doc: http://www.duncan-williams.com/  -Check out the [facility] Active Adults 50+ section on the [place] of [employer] (hiking clubs, book clubs, cards and games, chess, exercise classes, aquatic classes and much more) - see the website for details: https://www.[place].gov/departments/parks-recreation/active-adults50  -YouTube has lots of exercise videos for different ages and abilities as well  -[facility] (a variety of indoor and outdoor inperson activities for adults). [phone]. [address].  -Virtual Online Classes (a variety of topics): see seniorplanet.org or call [phone]  -consider volunteering at a school, hospice center, church, senior center or elsewhere

## 2023-08-20 NOTE — Progress Notes (Signed)
PATIENT CHECK-IN and HEALTH RISK ASSESSMENT QUESTIONNAIRE:  -completed by phone/video for upcoming Medicare Preventive Visit  Pre-Visit Check-in: 1)Vitals (height, wt, BP, etc) - record in vitals section for visit on day of visit Request home vitals (wt, BP, etc.) and enter into vitals, THEN update Vital Signs SmartPhrase below at the top of the HPI. See below.  2)Review and Update Medications, Allergies PMH, Surgeries, Social history in Epic 3)Hospitalizations in the last year with date/reason? no  4)Review and Update Care Team (patient's specialists) in Epic 5) Complete PHQ9 in Epic  6) Complete Fall Screening in Epic 7)Review all Health Maintenance Due and order under PCP if not done.  8)Medicare Wellness Questionnaire: Answer theses question about your habits: Do you drink alcohol? no If yes, how many drinks do you have a day?N/A Have you ever smoked? yes Quit date if applicable? 40 years ago How many packs a day do/did you smoke? Less than 1 pack per day Do you use smokeless tobacco? no Do you use an illicit drugs?no Do you exercises? Yes IF so, what type and how many days/minutes per week? stationary bike 2 days a weeks - 30 minutes, rakes leaves, takes care of her yard, 5-10lbs weights - free weights.  Are you sexually active? No Number of partners?1 Typical breakfast-cereal, oatmeal Typical lunch-sandwich, soup, frozen Lean Cuisine meals Typical dinner-chicken Typical snacks: crackers  Beverages: decaf coffee, water  Answer theses question about you: Can you perform most household chores?yes Do you find it hard to follow a conversation in a noisy room? no Do you often ask people to speak up or repeat themselves?no Do you feel that you have a problem with memory?no Do you balance your checkbook and or bank acounts?yes Do you feel safe at home?yes Last dentist visit?6 months ago Do you need assistance with any of the following: Please note if so:  Driving? no  Feeding  yourself? no  Getting from bed to chair? no  Getting to the toilet? no  Bathing or showering? no  Dressing yourself? no  Managing money? no  Climbing a flight of stairs-no  Preparing meals? no   Do you have Advanced Directives in place (Living Will, Healthcare Power or [place])? yes   Last eye Exam and location?1 year ago   Do you currently use prescribed or non-prescribed narcotic or opioid pain medications?no  Do you have a history or close family history of breast, ovarian, tubal or peritoneal cancer or a family member with BRCA (breast cancer susceptibility 1 and 2) gene mutations? Maternal aunt had breast cancer  Request home vitals (wt, BP, etc.) and enter into vitals, THEN update Vital Signs SmartPhrase below at the top of the HPI. See below.   Nurse/Assistant Credentials/time stamp: Ronnald Collum [facility]   ----------------------------------------------------------------------------------------------------------------------------------------------------------------------------------------------------------------------  Because this visit was a virtual/telehealth visit, some criteria may be missing or patient reported. Any vitals not documented were not able to be obtained and vitals that have been documented are patient reported.    MEDICARE ANNUAL PREVENTIVE VISIT WITH PROVIDER: (Welcome to Medicare, initial annual wellness or annual wellness exam)  Virtual Visit via Phone Note  I connected with Kaitlin Bundy Mckeehan on 08/20/23 by phone and verified that I am speaking with the correct person using two identifiers.  Location patient: home Location provider:work or home office Persons participating in the virtual visit: patient, provider  Concerns and/or follow up today: stable, no concerns   See HM section in Epic for other details of completed HM.  ROS: negative for report of fevers, unintentional weight loss, vision changes, vision loss, hearing loss or change,  chest pain, sob, hemoptysis, melena, hematochezia, hematuria, falls, bleeding or bruising, thoughts of suicide or self harm, memory loss  Patient-completed extensive health risk assessment - reviewed and discussed with the patient: See Health Risk Assessment completed with patient prior to the visit either above or in recent phone note. This was reviewed in detailed with the patient today and appropriate recommendations, orders and referrals were placed as needed per Summary below and patient instructions.   Review of Medical History: -PMH, PSH, Family History and current specialty and care providers reviewed and updated and listed below   Patient Care Team: [place], [name], MD as PCP - General (Family Medicine) Verner Chol, [facility] (Inactive) as Pharmacist (Pharmacist)   Past Medical History:  Diagnosis Date   Allergic rhinitis    CVA (cerebral vascular accident) [facility])    Hypertension    Migraine headache    Osteoporosis    PMS (premenstrual syndrome)    Rosacea     Past Surgical History:  Procedure Laterality Date   childbirthx2     COLONOSCOPY  06/2003    Social History   Socioeconomic History   Marital status: Widowed    Spouse name: Not on file   Number of children: 2   Years of education: Not on file   Highest education level: Not on file  Occupational History   Not on file  Tobacco Use   Smoking status: Former    Current packs/day: 0.00    Types: Cigarettes    Quit date: 10/20/1976    Years since quitting: 46.8   Smokeless tobacco: Never  Vaping Use   Vaping status: Never Used  Substance and Sexual Activity   Alcohol use: No   Drug use: No   Sexual activity: Not on file  Other Topics Concern   Not on file  Social History Narrative   Lives alone   Right handed   Drinks no caffeine daily   Social Determinants of Health   Financial Resource Strain: Low Risk  (08/20/2023)   Overall Financial Resource Strain (CARDIA)    Difficulty of Paying Living  Expenses: Not hard at all  Food Insecurity: No Food Insecurity (08/20/2023)   Hunger Vital Sign    Worried About Running Out of Food in the Last Year: Never true    Ran Out of Food in the Last Year: Never true  Transportation Needs: No Transportation Needs (08/20/2023)   PRAPARE - Administrator, Civil Service (Medical): No    Lack of Transportation (Non-Medical): No  Physical Activity: Sufficiently Active (08/20/2023)   Exercise Vital Sign    Days of Exercise per Week: 2 days    Minutes of Exercise per Session: 120 min  Recent Concern: Physical Activity - Insufficiently Active (08/20/2023)   Exercise Vital Sign    Days of Exercise per Week: 2 days    Minutes of Exercise per Session: 60 min  Stress: No Stress Concern Present (08/20/2023)   [employer] of Occupational Health - Occupational Stress Questionnaire    Feeling of Stress : Not at all  Social Connections: Moderately Integrated (08/20/2023)   Social Connection and Isolation Panel [NHANES]    Frequency of Communication with Friends and Family: More than three times a week    Frequency of Social Gatherings with Friends and Family: Once a week    Attends Religious Services: More than 4 times per  year    Active Member of Clubs or Organizations: Yes    Attends Banker Meetings: 1 to 4 times per year    Marital Status: Widowed  Intimate Partner Violence: Not At Risk (08/16/2021)   Humiliation, Afraid, Rape, and Kick questionnaire    Fear of Current or Ex-Partner: No    Emotionally Abused: No    Physically Abused: No    Sexually Abused: No    Family History  Problem Relation Age of Onset   Colon polyps Sister    Colon polyps Brother    Breast cancer Maternal Aunt    CAD Mother    Colon cancer Neg Hx     Current Outpatient Medications on File Prior to Visit  Medication Sig Dispense Refill   acetaminophen (TYLENOL) 500 MG tablet Take 500 mg by mouth every 6 (six) hours as needed.      atorvastatin (LIPITOR) 40 MG tablet Take 1 tablet (40 mg total) by mouth daily. 90 tablet 3   Calcium Carbonate-Vitamin D (CALCIUM 600 + D PO) Take 1 tablet by mouth daily.     diltiazem (CARDIZEM CD) 120 MG 24 hr capsule Take 1 capsule (120 mg total) by mouth daily. 90 capsule 1   loratadine (CLARITIN) 10 MG tablet Take 10 mg by mouth daily.     Multiple Vitamins-Minerals (CENTRUM SILVER PO) Take 1 tablet by mouth daily.     rivaroxaban (XARELTO) 20 MG TABS tablet Take 1 tablet (20 mg total) by mouth daily with supper. 90 tablet 3   tiZANidine (ZANAFLEX) 4 MG tablet Take 0.5-1 tablets (2-4 mg total) by mouth 2 (two) times daily as needed for muscle spasms. 60 tablet 5   traMADol (ULTRAM) 50 MG tablet Take 0.5-1 tablets (25-50 mg total) by mouth daily as needed. TAKE 1 TABLET BY MOUTH AT BEDTIME AS NEEDED FOR PAIN 20 tablet 0   No current facility-administered medications on file prior to visit.    No Known Allergies     Physical Exam Vitals requested from patient and listed below if patient had equipment and was able to obtain at home for this virtual visit: There were no vitals filed for this visit. Estimated body mass index is 30.23 kg/m as calculated from the following:   Height as of 04/17/23: 5\' 1"  (1.549 m).   Weight as of this encounter: 160 lb (72.6 kg).  EKG (optional): deferred due to virtual visit  GENERAL: alert, oriented, no acute distress detected, full vision exam deferred due to pandemic and/or virtual encounter  MS: moves all visible extremities without noticeable abnormality  PSYCH/NEURO: pleasant and cooperative, no obvious depression or anxiety, speech and thought processing grossly intact, Cognitive function grossly intact  Flowsheet Row Office Visit from [facility]/20/2023 in [facility]  PHQ-9 Total Score 8           08/20/2023   11:01 AM 04/17/2023    8:51 AM 08/18/2022   10:50 AM 11/26/2021    8:20 AM 08/16/2021    8:28 AM   Depression screen PHQ 2/9  Decreased Interest 1 0 0 0 0  Down, Depressed, Hopeless 3 0 0 0 0  PHQ - 2 Score 4 0 0 0 0  Altered sleeping 0      Tired, decreased energy 0      Change in appetite 3      Feeling bad or failure about yourself  0      Trouble concentrating 0  Moving slowly or fidgety/restless 1      Suicidal thoughts 0      PHQ-9 Score 8           08/16/2021    8:28 AM 07/18/2022    2:34 AM 08/18/2022   10:49 AM 04/17/2023    8:51 AM 08/20/2023   11:01 AM  Fall Risk  Falls in the past year? 0  0 0 0  Was there an injury with Fall? 0  0 0 0  Fall Risk Category Calculator 0  0 0 0  Fall Risk Category (Retired) Low  Low    (RETIRED) Patient Fall Risk Level  Low fall risk Low fall risk    Patient at Risk for Falls Due to No Fall Risks  No Fall Risks;Medication side effect Other (Comment) No Fall Risks  Fall risk Follow up Falls evaluation completed  Falls prevention discussed;Education provided;Falls evaluation completed Falls evaluation completed Falls evaluation completed     SUMMARY AND PLAN:  Encounter for Medicare annual wellness exam   Discussed applicable health maintenance/preventive health measures and advised and referred or ordered per patient preferences: -discussed vaccines due Health Maintenance  Topic Date Due   COVID-19 Vaccine (4 - 2023-24 season) 09/05/2023 (Originally 06/21/2023)   INFLUENZA VACCINE  01/18/2024 (Originally 05/21/2023)   Medicare Annual Wellness (AWV)  08/19/2024   DTaP/Tdap/Td (4 - Td or Tdap) 09/22/2026   Pneumonia Vaccine 56+ Years old  Completed   DEXA SCAN  Completed   Zoster Vaccines- Shingrix  Completed   HPV VACCINES  Aged Out   Colonoscopy  Discontinued   Education and counseling on the following was provided based on the above review of health and a plan/checklist for the patient, along with additional information discussed, was provided for the patient in the patient instructions :   -Advised and counseled on a  healthy lifestyle - including the importance of a healthy diet, regular physical activity, social connections  -Reviewed patient's current diet. Advised and counseled on a whole foods based healthy diet. A summary of a healthy diet was provided in the Patient Instructions. She was interested in losing weight so we went through her diet and found a number of hidden processed/high sugar foods and discussed whole food substitutes at length. Encouraged 5 serving of veggies, low sugar fruits per day, and a whole foods, low sugar, low sodium diet.  -reviewed patient's current physical activity level and discussed exercise guidelines for adults. Discussed community resources and ideas for safe exercise at home to assist in meeting exercise guideline recommendations in a safe and healthy way.  -Advise yearly dental visits at minimum and regular eye exams   Follow up: see patient instructions     Patient Instructions  I really enjoyed getting to talk with you today! I am available on Tuesdays and Thursdays for virtual visits if you have any questions or concerns, or if I can be of any further assistance.   CHECKLIST FROM ANNUAL WELLNESS VISIT:  -Follow up (please call to schedule if not scheduled after visit):   -yearly for annual wellness visit with primary care office  -schedule your yearly visit with your doctor  Here is a list of your preventive care/health maintenance measures and the plan for each if any are due:  PLAN For any measures below that may be due:  -if you want to do the covid and flu vaccines - you can get them at the pharmacy.   Health Maintenance  Topic Date Due  COVID-19 Vaccine (4 - 2023-24 season) 09/05/2023 (Originally 06/21/2023)   INFLUENZA VACCINE  01/18/2024 (Originally 05/21/2023)   Medicare Annual Wellness (AWV)  08/19/2024   DTaP/Tdap/Td (4 - Td or Tdap) 09/22/2026   Pneumonia Vaccine 37+ Years old  Completed   DEXA SCAN  Completed   Zoster Vaccines- Shingrix   Completed   HPV VACCINES  Aged Out   Colonoscopy  Discontinued    -See a dentist at least yearly  -Get your eyes checked and then per your eye specialist's recommendations  -Other issues addressed today:   -I have included below further information regarding a healthy whole foods based diet, physical activity guidelines for adults, stress management and opportunities for social connections. I hope you find this information useful.   -----------------------------------------------------------------------------------------------------------------------------------------------------------------------------------------------------------------------------------------------------------  NUTRITION: -eat real food: lots of colorful vegetables (half the plate) and fruits -5-7 servings of vegetables and fruits per day (fresh or steamed is best), exp. 2 servings of vegetables with lunch and dinner and 2 servings of fruit per day. Berries and greens such as kale and collards are great choices.  -consume on a regular basis: whole grains (make sure first ingredient on label contains the word "whole"), fresh fruits, fish, nuts, seeds, healthy oils (such as olive oil, avocado oil, grape seed oil) -may eat small amounts of dairy and lean meat on occasion, but avoid processed meats such as ham, bacon, lunch meat, etc. -drink water -try to avoid fast food and pre-packaged foods, processed meat -most experts advise limiting sodium to < 2300mg  per day, should limit further is any chronic conditions such as high blood pressure, heart disease, diabetes, etc. The American Heart Association advised that < 1500mg  is is ideal -try to avoid foods that contain any ingredients with names you do not recognize  -try to avoid sugar/sweets (except for the natural sugar that occurs in fresh fruit) -try to avoid sweet drinks -try to avoid white rice, white bread, pasta (unless whole grain), white or yellow  potatoes  EXERCISE GUIDELINES FOR ADULTS: -if you wish to increase your physical activity, do so gradually and with the approval of your doctor -STOP and seek medical care immediately if you have any chest pain, chest discomfort or trouble breathing when starting or increasing exercise  -move and stretch your body, legs, feet and arms when sitting for long periods -Physical activity guidelines for optimal health in adults: -least 150 minutes per week of aerobic exercise (can talk, but not sing) once approved by your doctor, 20-30 minutes of sustained activity or two 10 minute episodes of sustained activity every day.  -resistance training at least 2 days per week if approved by your doctor -balance exercises 3+ days per week:   Stand somewhere where you have something sturdy to hold onto if you lose balance.    1) lift up on toes, start with 5x per day and work up to 20x   2) stand and lift on leg straight out to the side so that foot is a few inches of the floor, start with 5x each side and work up to 20x each side   3) stand on one foot, start with 5 seconds each side and work up to 20 seconds on each side  If you need ideas or help with getting more active:  -Silver sneakers https://tools.silversneakers.com  -Walk with a Doc: http://www.duncan-williams.com/  -try to include resistance (weight lifting/strength building) and balance exercises twice per week: or the following link for ideas: http://castillo-powell.com/  BuyDucts.dk  STRESS MANAGEMENT: -  can try meditating, or just sitting quietly with deep breathing while intentionally relaxing all parts of your body for 5 minutes daily -if you need further help with stress, anxiety or depression please follow up with your primary doctor or contact the wonderful folks at [facility]: [phone]  SOCIAL CONNECTIONS: -options in [place]  if you wish to engage in more social and exercise related activities:  -Silver sneakers https://tools.silversneakers.com  -Walk with a Doc: http://www.duncan-williams.com/  -Check out the [facility] Active Adults 50+ section on the [place] [employer] (hiking clubs, book clubs, cards and games, chess, exercise classes, aquatic classes and much more) - see the website for details: https://www.[place].gov/departments/parks-recreation/active-adults50  -YouTube has lots of exercise videos for different ages and abilities as well  -[facility] (a variety of indoor and outdoor inperson activities for adults). [phone]. [address].  -Virtual Online Classes (a variety of topics): see seniorplanet.org or call [phone]  -consider volunteering at a school, hospice center, church, senior center or elsewhere           Terressa Koyanagi, DO

## 2023-11-09 ENCOUNTER — Other Ambulatory Visit: Payer: Self-pay | Admitting: Family Medicine

## 2023-11-09 DIAGNOSIS — I480 Paroxysmal atrial fibrillation: Secondary | ICD-10-CM

## 2023-11-17 DIAGNOSIS — H18413 Arcus senilis, bilateral: Secondary | ICD-10-CM | POA: Diagnosis not present

## 2023-11-17 DIAGNOSIS — Z961 Presence of intraocular lens: Secondary | ICD-10-CM | POA: Diagnosis not present

## 2023-11-17 DIAGNOSIS — H02831 Dermatochalasis of right upper eyelid: Secondary | ICD-10-CM | POA: Diagnosis not present

## 2023-11-17 DIAGNOSIS — H04123 Dry eye syndrome of bilateral lacrimal glands: Secondary | ICD-10-CM | POA: Diagnosis not present

## 2023-11-18 ENCOUNTER — Other Ambulatory Visit: Payer: Self-pay | Admitting: Cardiology

## 2023-11-18 DIAGNOSIS — I10 Essential (primary) hypertension: Secondary | ICD-10-CM

## 2023-11-18 DIAGNOSIS — I480 Paroxysmal atrial fibrillation: Secondary | ICD-10-CM

## 2023-12-01 ENCOUNTER — Other Ambulatory Visit: Payer: Self-pay | Admitting: Cardiology

## 2023-12-01 DIAGNOSIS — I10 Essential (primary) hypertension: Secondary | ICD-10-CM

## 2023-12-01 DIAGNOSIS — I480 Paroxysmal atrial fibrillation: Secondary | ICD-10-CM

## 2024-01-05 ENCOUNTER — Other Ambulatory Visit: Payer: Self-pay | Admitting: Cardiology

## 2024-01-05 DIAGNOSIS — I10 Essential (primary) hypertension: Secondary | ICD-10-CM

## 2024-01-05 DIAGNOSIS — I480 Paroxysmal atrial fibrillation: Secondary | ICD-10-CM

## 2024-01-20 NOTE — Progress Notes (Incomplete)
 HPI: [name] is a 83 y.o. female with a PMHx significant for paroxysmal atrial fibrillation, CVA, HTN, chronic back pain, HLD, and thrombocytopenia, among some, who is here today for chronic disease management.  Last seen on 04/17/2023  *** Review of Systems See other pertinent positives and negatives in HPI.  Current Outpatient Medications on File Prior to Visit  Medication Sig Dispense Refill   acetaminophen (TYLENOL) 500 MG tablet Take 500 mg by mouth every 6 (six) hours as needed.     atorvastatin (LIPITOR) 40 MG tablet Take 1 tablet (40 mg total) by mouth daily. 90 tablet 3   Calcium Carbonate-Vitamin D (CALCIUM 600 + D PO) Take 1 tablet by mouth daily.     diltiazem (CARDIZEM CD) 120 MG 24 hr capsule Take 1 capsule (120 mg total) by mouth daily. 15 capsule 0   loratadine (CLARITIN) 10 MG tablet Take 10 mg by mouth daily.     Multiple Vitamins-Minerals (CENTRUM SILVER PO) Take 1 tablet by mouth daily.     rivaroxaban (XARELTO) 20 MG TABS tablet Take 1 tablet (20 mg total) by mouth daily with supper. 90 tablet 2   tiZANidine (ZANAFLEX) 4 MG tablet Take 0.5-1 tablets (2-4 mg total) by mouth 2 (two) times daily as needed for muscle spasms. 60 tablet 5   traMADol (ULTRAM) 50 MG tablet Take 0.5-1 tablets (25-50 mg total) by mouth daily as needed. TAKE 1 TABLET BY MOUTH AT BEDTIME AS NEEDED FOR PAIN 20 tablet 0   No current facility-administered medications on file prior to visit.    Past Medical History:  Diagnosis Date   Allergic rhinitis    CVA (cerebral vascular accident) (HCC)    Hypertension    Migraine headache    Osteoporosis    PMS (premenstrual syndrome)    Rosacea    No Known Allergies  Social History   Socioeconomic History   Marital status: Widowed    Spouse name: Not on file   Number of children: 2   Years of education: Not on file   Highest education level: Not on file  Occupational History   Not on file  Tobacco Use   Smoking status:  Former    Current packs/day: 0.00    Types: Cigarettes    Quit date: 10/20/1976    Years since quitting: 47.2   Smokeless tobacco: Never  Vaping Use   Vaping status: Never Used  Substance and Sexual Activity   Alcohol use: No   Drug use: No   Sexual activity: Not on file  Other Topics Concern   Not on file  Social History Narrative   Lives alone   Right handed   Drinks no caffeine daily   Social Drivers of Health   Financial Resource Strain: Low Risk  (08/20/2023)   Overall Financial Resource Strain (CARDIA)    Difficulty of Paying Living Expenses: Not hard at all  Food Insecurity: No Food Insecurity (08/20/2023)   Hunger Vital Sign    Worried About Running Out of Food in the Last Year: Never true    Ran Out of Food in the Last Year: Never true  Transportation Needs: No Transportation Needs (08/20/2023)   PRAPARE - Administrator, Civil Service (Medical): No    Lack of Transportation (Non-Medical): No  Physical Activity: Sufficiently Active (08/20/2023)   Exercise Vital Sign    Days of Exercise per Week: 2 days    Minutes of Exercise per Session: 120 min  Recent Concern: Physical Activity - Insufficiently Active (08/20/2023)   Exercise Vital Sign    Days of Exercise per Week: 2 days    Minutes of Exercise per Session: 60 min  Stress: No Stress Concern Present (08/20/2023)   [employer] of Occupational Health - Occupational Stress Questionnaire    Feeling of Stress : Not at all  Social Connections: Moderately Integrated (08/20/2023)   Social Connection and Isolation Panel [NHANES]    Frequency of Communication with Friends and Family: More than three times a week    Frequency of Social Gatherings with Friends and Family: Once a week    Attends Religious Services: More than 4 times per year    Active Member of [employer] or Organizations: Yes    Attends Banker Meetings: 1 to 4 times per year    Marital Status: Widowed    There were no vitals  filed for this visit. There is no height or weight on file to calculate BMI.  Physical Exam  ASSESSMENT AND PLAN:  [name] was seen today for chronic disease management.   No orders of the defined types were placed in this encounter.  No problem-specific Assessment & Plan notes found for this encounter.  No follow-ups on file.  I, Rolla Etienne Wierda, acting as a scribe for Betty [place], MD., have documented all relevant documentation on the behalf of Betty [place], MD, as directed by  Betty [place], MD while in the presence of Betty [place], MD.   I, Betty [place], MD, have reviewed all documentation for this visit. The documentation on 01/20/24 for the exam, diagnosis, procedures, and orders are all accurate and complete.  [name]. Sw[place]and, MD  [facility] [place] office.

## 2024-01-22 ENCOUNTER — Encounter: Admitting: Family Medicine

## 2024-01-29 ENCOUNTER — Telehealth: Payer: Self-pay

## 2024-01-29 NOTE — Telephone Encounter (Signed)
 Copied from CRM [id number]. Topic: General - Other >> Jan 29, 2024  8:50 AM Rodman Pickle T wrote: Reason for CRM: patient is needing a call back to schedule a physical I tried to pull the schedule up but it wouldn't let me

## 2024-02-19 NOTE — Progress Notes (Unsigned)
 HPI: FU atrial fibrillation.  Patient admitted December 2021 with altered mental status.  MRI consistent with posterior circulation infarct.  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  Echocardiogram December 2021 showed normal LV function.  Patient noted to have paroxysmal atrial fibrillation during hospitalization and apixaban  added.  Carotid Doppler September 2022 showed 1 to 39% bilateral stenosis.  Since last seen, she notes some dyspnea on exertion but no orthopnea, PND or pedal edema.  She has not had chest pain or syncope.  No bleeding.  Current Outpatient Medications  Medication Sig Dispense Refill   acetaminophen  (TYLENOL ) 500 MG tablet Take 500 mg by mouth every 6 (six) hours as needed.     atorvastatin  (LIPITOR) 40 MG tablet Take 1 tablet (40 mg total) by mouth daily. 90 tablet 3   Calcium  Carbonate-Vitamin D (CALCIUM  600 + D PO) Take 1 tablet by mouth daily.     diltiazem  (CARDIZEM  CD) 120 MG 24 hr capsule Take 1 capsule (120 mg total) by mouth daily. 15 capsule 0   loratadine  (CLARITIN ) 10 MG tablet Take 10 mg by mouth daily.     Multiple Vitamins-Minerals (CENTRUM SILVER PO) Take 1 tablet by mouth daily.     rivaroxaban  (XARELTO ) 20 MG TABS tablet Take 1 tablet (20 mg total) by mouth daily with supper. 90 tablet 2   tiZANidine  (ZANAFLEX ) 4 MG tablet Take 0.5-1 tablets (2-4 mg total) by mouth 2 (two) times daily as needed for muscle spasms. 60 tablet 5   traMADol  (ULTRAM ) 50 MG tablet Take 0.5-1 tablets (25-50 mg total) by mouth daily as needed. TAKE 1 TABLET BY MOUTH AT BEDTIME AS NEEDED FOR PAIN 20 tablet 0   No current facility-administered medications for this visit.     Past Medical History:  Diagnosis Date   Allergic rhinitis    CVA (cerebral vascular accident) (HCC)    Hypertension    Migraine headache    Osteoporosis    PMS (premenstrual syndrome)    Rosacea     Past Surgical History:  Procedure  Laterality Date   childbirthx2     COLONOSCOPY  06/2003    Social History   Socioeconomic History   Marital status: Widowed    Spouse name: Not on file   Number of children: 2   Years of education: Not on file   Highest education level: Not on file  Occupational History   Not on file  Tobacco Use   Smoking status: Former    Current packs/day: 0.00    Types: Cigarettes    Quit date: 10/20/1976    Years since quitting: 47.3   Smokeless tobacco: Never  Vaping Use   Vaping status: Never Used  Substance and Sexual Activity   Alcohol use: No   Drug use: No   Sexual activity: Not on file  Other Topics Concern   Not on file  Social History Narrative   Lives alone   Right handed   Drinks no caffeine daily   Social Drivers of Health   Financial Resource Strain: Low Risk  (08/20/2023)   Overall Financial Resource Strain (CARDIA)    Difficulty of Paying Living Expenses: Not hard at all  Food Insecurity: No Food Insecurity (08/20/2023)   Hunger Vital Sign    Worried About Running Out of Food in the Last Year: Never true    Ran Out of Food in the Last Year: Never true  Transportation Needs:  No Transportation Needs (08/20/2023)   PRAPARE - Administrator, Civil Service (Medical): No    Lack of Transportation (Non-Medical): No  Physical Activity: Sufficiently Active (08/20/2023)   Exercise Vital Sign    Days of Exercise per Week: 2 days    Minutes of Exercise per Session: 120 min  Recent Concern: Physical Activity - Insufficiently Active (08/20/2023)   Exercise Vital Sign    Days of Exercise per Week: 2 days    Minutes of Exercise per Session: 60 min  Stress: No Stress Concern Present (08/20/2023)   [employer] of Occupational Health - Occupational Stress Questionnaire    Feeling of Stress : Not at all  Social Connections: Moderately Integrated (08/20/2023)   Social Connection and Isolation Panel [NHANES]    Frequency of Communication with Friends and  Family: More than three times a week    Frequency of Social Gatherings with Friends and Family: Once a week    Attends Religious Services: More than 4 times per year    Active Member of [employer] or Organizations: Yes    Attends Banker Meetings: 1 to 4 times per year    Marital Status: Widowed  Intimate Partner Violence: Not At Risk (08/16/2021)   Humiliation, Afraid, Rape, and Kick questionnaire    Fear of Current or Ex-Partner: No    Emotionally Abused: No    Physically Abused: No    Sexually Abused: No    Family History  Problem Relation Age of Onset   Colon polyps Sister    Colon polyps Brother    Breast cancer Maternal Aunt    CAD Mother    Colon cancer Neg Hx     ROS: no fevers or chills, productive cough, hemoptysis, dysphasia, odynophagia, melena, hematochezia, dysuria, hematuria, rash, seizure activity, orthopnea, PND, pedal edema, claudication. Remaining systems are negative.  Physical Exam: Well-developed well-nourished in no acute distress.  Skin is warm and dry.  HEENT is normal.  Neck is supple.  Chest is clear to auscultation with normal expansion.  Cardiovascular exam is irregular Abdominal exam nontender or distended. No masses palpated. Extremities show no edema. neuro grossly intact  EKG Interpretation Date/Time:  Tuesday Feb 23 2024 11:08:47 EDT Ventricular Rate:  89 PR Interval:    QRS Duration:  86 QT Interval:  378 QTC Calculation: 459 R Axis:   28  Text Interpretation: Atrial fibrillation Confirmed by Alexandria Angel ([id number]) on 02/23/2024 11:09:39 AM    A/P  1 permanent atrial fibrillation-will continue Cardizem  for rate control.  Continue Xarelto .  Check hemoglobin and renal function.  2 hyperlipidemia-continue statin.  Check lipids and liver.  3 hypertension-patient's blood pressure is elevated;  Alexandria Angel, MD

## 2024-02-23 ENCOUNTER — Encounter: Payer: Self-pay | Admitting: Cardiology

## 2024-02-23 ENCOUNTER — Ambulatory Visit: Attending: Cardiology | Admitting: Cardiology

## 2024-02-23 VITALS — BP 160/86 | HR 94 | Ht 65.0 in | Wt 175.0 lb

## 2024-02-23 DIAGNOSIS — I480 Paroxysmal atrial fibrillation: Secondary | ICD-10-CM | POA: Diagnosis not present

## 2024-02-23 DIAGNOSIS — I10 Essential (primary) hypertension: Secondary | ICD-10-CM

## 2024-02-23 DIAGNOSIS — E7800 Pure hypercholesterolemia, unspecified: Secondary | ICD-10-CM

## 2024-02-23 LAB — LIPID PANEL

## 2024-02-23 NOTE — Patient Instructions (Signed)
   Follow-Up: At [facility], you and your health needs are our priority.  As part of our continuing mission to provide you with exceptional heart care, our providers are all part of one team.  This team includes your primary Cardiologist (physician) and Advanced Practice Providers or APPs (Physician Assistants and Nurse Practitioners) who all work together to provide you with the care you need, when you need it.  Your next appointment:   12 month(s)  Provider:   Alexandria Angel MD   We recommend signing up for the patient portal called "MyChart".  Sign up information is provided on this After Visit Summary.  MyChart is used to connect with patients for Virtual Visits (Telemedicine).  Patients are able to view lab/test results, encounter notes, upcoming appointments, etc.  Non-urgent messages can be sent to your provider as well.   To learn more about what you can do with MyChart, go to [email or web address].

## 2024-02-24 ENCOUNTER — Encounter: Payer: Self-pay | Admitting: *Deleted

## 2024-02-24 LAB — COMPREHENSIVE METABOLIC PANEL WITH GFR
ALT: 21 IU/L (ref 0–32)
AST: 34 IU/L (ref 0–40)
Albumin: 4.4 g/dL (ref 3.7–4.7)
Alkaline Phosphatase: 128 IU/L — ABNORMAL HIGH (ref 44–121)
BUN/Creatinine Ratio: 24 (ref 12–28)
BUN: 18 mg/dL (ref 8–27)
Bilirubin Total: 1.1 mg/dL (ref 0.0–1.2)
CO2: 23 mmol/L (ref 20–29)
Calcium: 9.6 mg/dL (ref 8.7–10.3)
Chloride: 102 mmol/L (ref 96–106)
Creatinine, Ser: 0.74 mg/dL (ref 0.57–1.00)
Globulin, Total: 3.3 g/dL (ref 1.5–4.5)
Glucose: 83 mg/dL (ref 70–99)
Potassium: 4.3 mmol/L (ref 3.5–5.2)
Sodium: 140 mmol/L (ref 134–144)
Total Protein: 7.7 g/dL (ref 6.0–8.5)
eGFR: 80 mL/min/{1.73_m2} (ref >59)

## 2024-02-24 LAB — LIPID PANEL
Cholesterol, Total: 158 mg/dL (ref 100–199)
HDL: 100 mg/dL (ref >39)
LDL CALC COMMENT:: 1.6 ratio (ref 0.0–4.4)
LDL Chol Calc (NIH): 47 mg/dL (ref 0–99)
Triglycerides: 52 mg/dL (ref 0–149)
VLDL Cholesterol Cal: 11 mg/dL (ref 5–40)

## 2024-02-24 LAB — CBC
Hematocrit: 45.2 % (ref 34.0–46.6)
Hemoglobin: 14.9 g/dL (ref 11.1–15.9)
MCH: 29 pg (ref 26.6–33.0)
MCHC: 33 g/dL (ref 31.5–35.7)
MCV: 88 fL (ref 79–97)
Platelets: 164 10*3/uL (ref 150–450)
RBC: 5.14 x10E6/uL (ref 3.77–5.28)
RDW: 13.7 % (ref 11.7–15.4)
WBC: 5.2 10*3/uL (ref 3.4–10.8)

## 2024-04-23 ENCOUNTER — Other Ambulatory Visit: Payer: Self-pay | Admitting: Family Medicine

## 2024-07-01 ENCOUNTER — Other Ambulatory Visit: Payer: Self-pay

## 2024-07-01 MED ORDER — ATORVASTATIN CALCIUM 40 MG PO TABS: 40.0000 mg | ORAL_TABLET | Freq: Every day | ORAL | 0 refills | Status: AC

## 2024-08-02 ENCOUNTER — Other Ambulatory Visit: Payer: Self-pay | Admitting: Family Medicine
